# Patient Record
Sex: Female | Born: 1970
Health system: Southern US, Community
[De-identification: ages and names within clinical notes are randomized; demographics above are authoritative.]

## PROBLEM LIST (undated history)

## (undated) DIAGNOSIS — K219 Gastro-esophageal reflux disease without esophagitis: Secondary | ICD-10-CM

## (undated) DIAGNOSIS — F411 Generalized anxiety disorder: Secondary | ICD-10-CM

## (undated) DIAGNOSIS — I10 Essential (primary) hypertension: Secondary | ICD-10-CM

## (undated) DIAGNOSIS — R12 Heartburn: Secondary | ICD-10-CM

## (undated) DIAGNOSIS — D649 Anemia, unspecified: Secondary | ICD-10-CM

## (undated) HISTORY — DX: Heartburn: R12

## (undated) HISTORY — PX: SIGMOIDOSCOPY: SUR1295

## (undated) HISTORY — DX: Generalized anxiety disorder: F41.1

## (undated) HISTORY — PX: BREAST EXCISIONAL BIOPSY: SUR124

---
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
## 2002-11-12 ENCOUNTER — Other Ambulatory Visit: Admission: RE | Admit: 2002-11-12 | Discharge: 2002-11-12 | Payer: Self-pay | Admitting: Family Medicine

## 2004-01-26 ENCOUNTER — Other Ambulatory Visit: Admission: RE | Admit: 2004-01-26 | Discharge: 2004-01-26 | Payer: Self-pay | Admitting: Obstetrics and Gynecology

## 2004-05-27 ENCOUNTER — Inpatient Hospital Stay (HOSPITAL_COMMUNITY): Admission: AD | Admit: 2004-05-27 | Discharge: 2004-05-27 | Payer: Self-pay | Admitting: Obstetrics and Gynecology

## 2004-07-11 ENCOUNTER — Inpatient Hospital Stay (HOSPITAL_COMMUNITY): Admission: AD | Admit: 2004-07-11 | Discharge: 2004-07-11 | Payer: Self-pay | Admitting: Obstetrics & Gynecology

## 2004-07-18 ENCOUNTER — Inpatient Hospital Stay (HOSPITAL_COMMUNITY): Admission: AD | Admit: 2004-07-18 | Discharge: 2004-07-21 | Payer: Self-pay | Admitting: Obstetrics and Gynecology

## 2004-09-02 ENCOUNTER — Other Ambulatory Visit: Admission: RE | Admit: 2004-09-02 | Discharge: 2004-09-02 | Payer: Self-pay | Admitting: Obstetrics and Gynecology

## 2007-03-05 ENCOUNTER — Inpatient Hospital Stay (HOSPITAL_COMMUNITY): Admission: AD | Admit: 2007-03-05 | Discharge: 2007-03-07 | Payer: Self-pay | Admitting: Obstetrics & Gynecology

## 2007-05-16 LAB — CONVERTED CEMR LAB: Pap Smear: NORMAL

## 2009-03-02 ENCOUNTER — Ambulatory Visit: Payer: Self-pay | Admitting: Internal Medicine

## 2009-03-02 DIAGNOSIS — F419 Anxiety disorder, unspecified: Secondary | ICD-10-CM | POA: Insufficient documentation

## 2009-03-02 DIAGNOSIS — Z87448 Personal history of other diseases of urinary system: Secondary | ICD-10-CM | POA: Insufficient documentation

## 2009-03-02 LAB — CONVERTED CEMR LAB
ALT: 14 units/L (ref 0–35)
Basophils Relative: 2 % (ref 0.0–3.0)
Bilirubin, Direct: 0.2 mg/dL (ref 0.0–0.3)
Chloride: 103 meq/L (ref 96–112)
Cholesterol: 174 mg/dL (ref 0–200)
Eosinophils Relative: 2.8 % (ref 0.0–5.0)
HCT: 42.5 % (ref 36.0–46.0)
Hemoglobin: 14.2 g/dL (ref 12.0–15.0)
LDL Cholesterol: 93 mg/dL (ref 0–99)
Lymphs Abs: 1.7 10*3/uL (ref 0.7–4.0)
MCV: 89.5 fL (ref 78.0–100.0)
Monocytes Absolute: 0.5 10*3/uL (ref 0.1–1.0)
Neutro Abs: 4.2 10*3/uL (ref 1.4–7.7)
Potassium: 4.3 meq/L (ref 3.5–5.1)
RBC: 4.75 M/uL (ref 3.87–5.11)
Specific Gravity, Urine: <=1.005 (ref 1.000–1.030)
TSH: 2.49 microintl units/mL (ref 0.35–5.50)
Total Protein: 7.7 g/dL (ref 6.0–8.3)
Urine Glucose: NEGATIVE mg/dL
Urobilinogen, UA: 0.2 (ref 0.0–1.0)
WBC: 6.7 10*3/uL (ref 4.5–10.5)

## 2010-02-24 ENCOUNTER — Encounter: Payer: Self-pay | Admitting: Internal Medicine

## 2010-06-14 NOTE — Miscellaneous (Signed)
Summary: Flu Vaccination/[facility]  Flu Vaccination/[facility]   Imported By: Sherian Rein 02/28/2010 09:22:05  _____________________________________________________________________  External Attachment:    Type:   Image     Comment:   External Document

## 2010-08-01 ENCOUNTER — Telehealth: Payer: Self-pay | Admitting: Internal Medicine

## 2010-08-11 NOTE — Progress Notes (Signed)
Summary: anxiety symptoms - start paxil and sched cpx  Phone Note Call from Patient   Caller: Patient Call For: Marie Lukes MD Reason for Call: Talk to Doctor Summary of Call: pt with increase anxiety symptoms - still using occ clonazepam as needed but would like to try ssri to control chronic symptoms - unable to come in for apt immediately due to schedule issues (new emr and pt upcoming vacation) - will start low dose generic paxil (erx done) and f/u in 4-6 weeks to review meds and symptoms - pt agrees risk/benefit and aware of poss med SE - will call if probs prior to sched OV Initial call taken by: Marie Lukes MD,  August 01, 2010 12:59 PM  Follow-up for Phone Call        please sched this pt for cpx/labs in next 4-6 weeks when good for her sched - will review meds and symptoms at that time - thanks Follow-up by: Marie Lukes MD,  August 01, 2010 1:06 PM  Additional Follow-up for Phone Call Additional follow up Details #1::        left message to vm to cb to sched appt. Additional Follow-up by: Verdell Face,  August 01, 2010 3:10 PM    Additional Follow-up for Phone Call Additional follow up Details #2::    CPX sched for April 27th at 3p, pt wants to do labs same day. Follow-up by: Verdell Face,  August 01, 2010 4:29 PM  Additional Follow-up for Phone Call Additional follow up Details #3:: Details for Additional Follow-up Action Taken: Noted  Additional Follow-up by: Orlan Leavens RMA,  August 01, 2010 4:34 PM  New/Updated Medications: PAROXETINE HCL 10 MG TABS (PAROXETINE HCL) 1 by mouth once daily x 7 days, then increase to 2 by mouth once daily (or as directed) Prescriptions: PAROXETINE HCL 10 MG TABS (PAROXETINE HCL) 1 by mouth once daily x 7 days, then increase to 2 by mouth once daily (or as directed)  #60 x 1   Entered and Authorized by:   Marie Lukes MD   Signed by:   Marie Lukes MD on 08/01/2010   Method used:   Electronically to   [facility] # [id number]* (retail)       [address]       [place], [place]  [address]       Ph: [phone]       Fax: [phone]   RxID:   [id number]   Appended Document: anxiety symptoms - start paxil and sched cpx pt changed pharmacy to [facility] - new erx done Marie Lukes MD  August 01, 2010 5:40 PM    Clinical Lists Changes  Medications: Rx of PAROXETINE HCL 10 MG TABS (PAROXETINE HCL) 1 by mouth once daily x 7 days, then increase to 2 by mouth once daily (or as directed);  #60 x 1;  Signed;  Entered by: Marie Lukes MD;  Authorized by: Marie Lukes MD;  Method used: Electronically to [facility] # [id number], [place], [place]  [address], Ph: [phone], Fax: [phone]    Prescriptions: PAROXETINE HCL 10 MG TABS (PAROXETINE HCL) 1 by mouth once daily x 7 days, then increase to 2 by mouth once daily (or as directed)  #60 x 1   Entered and Authorized by:   Marie Lukes MD   Signed by:   Marie Lukes MD on 08/01/2010   Method used:  Electronically to        [facility] # [address] (retail)       [address]       [place], [place]  [address]       Ph: [phone]       Fax: [phone]   RxID:   [id number]

## 2010-09-05 ENCOUNTER — Encounter: Payer: Self-pay | Admitting: Internal Medicine

## 2010-09-08 ENCOUNTER — Other Ambulatory Visit (INDEPENDENT_AMBULATORY_CARE_PROVIDER_SITE_OTHER): Payer: BC Managed Care – PPO

## 2010-09-08 DIAGNOSIS — Z0000 Encounter for general adult medical examination without abnormal findings: Secondary | ICD-10-CM

## 2010-09-08 LAB — BASIC METABOLIC PANEL
BUN: 14 mg/dL (ref 6–23)
Calcium: 9.4 mg/dL (ref 8.4–10.5)
Creatinine, Ser: 0.8 mg/dL (ref 0.4–1.2)
GFR: 87.93 mL/min (ref >60.00)
Potassium: 4.7 mEq/L (ref 3.5–5.1)

## 2010-09-08 LAB — CBC WITH DIFFERENTIAL/PLATELET
Basophils Absolute: 0 10*3/uL (ref 0.0–0.1)
Eosinophils Relative: 2 % (ref 0.0–5.0)
HCT: 41.1 % (ref 36.0–46.0)
Hemoglobin: 14 g/dL (ref 12.0–15.0)
Lymphs Abs: 1.7 10*3/uL (ref 0.7–4.0)
MCV: 89.4 fl (ref 78.0–100.0)
Monocytes Absolute: 0.6 10*3/uL (ref 0.1–1.0)
Monocytes Relative: 7.5 % (ref 3.0–12.0)
Neutro Abs: 5.7 10*3/uL (ref 1.4–7.7)
Platelets: 272 10*3/uL (ref 150.0–400.0)
RDW: 13.8 % (ref 11.5–14.6)

## 2010-09-08 LAB — URINALYSIS
Nitrite: NEGATIVE
Specific Gravity, Urine: 1.02 (ref 1.000–1.030)
Total Protein, Urine: NEGATIVE
Urine Glucose: NEGATIVE
pH: 7 (ref 5.0–8.0)

## 2010-09-08 LAB — LIPID PANEL: Cholesterol: 183 mg/dL (ref 0–200)

## 2010-09-08 LAB — HEPATIC FUNCTION PANEL
ALT: 12 U/L (ref 0–35)
AST: 19 U/L (ref 0–37)
Bilirubin, Direct: 0.1 mg/dL (ref 0.0–0.3)
Total Bilirubin: 1.1 mg/dL (ref 0.3–1.2)

## 2010-09-09 ENCOUNTER — Ambulatory Visit (INDEPENDENT_AMBULATORY_CARE_PROVIDER_SITE_OTHER): Payer: BC Managed Care – PPO | Admitting: Internal Medicine

## 2010-09-09 ENCOUNTER — Other Ambulatory Visit: Payer: Self-pay | Admitting: Internal Medicine

## 2010-09-09 ENCOUNTER — Encounter: Payer: Self-pay | Admitting: Internal Medicine

## 2010-09-09 VITALS — BP 120/82 | HR 71 | Temp 98.4°F | Ht 65.0 in | Wt 131.0 lb

## 2010-09-09 DIAGNOSIS — Z0000 Encounter for general adult medical examination without abnormal findings: Secondary | ICD-10-CM

## 2010-09-09 MED ORDER — PAROXETINE HCL 10 MG PO TABS: 10.0000 mg | ORAL_TABLET | Freq: Two times a day (BID) | ORAL | Status: DC

## 2010-09-09 NOTE — Patient Instructions (Signed)
It was good to see you today. Labs and exams look good - keep up the good work. Try claritin or allegra for allergy and sinus pressure as needed Continue the Paxil as ongoing - Refill on medication(s) as discussed today. Please schedule followup in 6-12 months to review anxiety and medications, call sooner if problems.

## 2010-09-09 NOTE — Progress Notes (Signed)
  Subjective:    Patient ID: Marie Adams, female    DOB: 01-07-1971, 40 y.o.   MRN: [id number]  HPI patient is here today for annual physical. Patient feels well and has no complaints.  Past Medical History  Diagnosis Date  . ANXIETY    Family History  Problem Relation Age of Onset  . Arthritis Other   . Diabetes Other     Parent  . Hyperlipidemia Other     parent  . Hypertension Other     parent  . Transient ischemic attack Other     grandparent   History  Substance Use Topics  . Smoking status: Never Smoker   . Smokeless tobacco: Not on file   Comment: Lives with domestic partner 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  . Alcohol Use: No    Review of Systems  Constitutional: Negative for fever.  Respiratory: Negative for cough and shortness of breath.   Cardiovascular: Negative for chest pain.  Gastrointestinal: Negative for abdominal pain.  Musculoskeletal: Negative for gait problem.  Skin: Negative for rash.  Neurological: Negative for dizziness.  No other specific complaints in a complete review of systems (except as listed in HPI above).     Objective:   Physical Exam BP 120/82  Pulse 71  Temp(Src) 98.4 F (36.9 C) (Oral)  Ht 5\' 5"  (1.651 m)  Wt 131 lb (59.421 kg)  BMI 21.80 kg/m2  SpO2 97% Physical Exam  Constitutional: She is oriented to person, place, and time. She appears well-developed and well-nourished. No distress.  HENT: Head: Normocephalic and atraumatic. Ears: grossly normal hearing, TMs clear. Nose: Nose normal.  Mouth/Throat: Oropharynx is clear and moist. No oropharyngeal exudate.  Eyes: Conjunctivae and EOM are normal. Pupils are equal, round, and reactive to light. No scleral icterus.  Neck: Normal range of motion. Neck supple. No JVD present. No thyromegaly present.  Cardiovascular: Normal rate, regular rhythm and normal heart sounds.  No murmur heard. Pulmonary/Chest: Effort normal and breath sounds normal. No respiratory distress. She has no  wheezes.  Abdominal: Soft. Bowel sounds are normal. She exhibits no distension. There is no tenderness.  Musculoskeletal: Normal range of motion. She exhibits no edema.  GU: defer to gynecology Neurological: She is alert and oriented to person, place, and time. No cranial nerve deficit. Coordination normal.  Skin: Skin is warm and dry. No rash noted. No erythema.  Psychiatric: She has a normal mood and affect. Her behavior is normal. Judgment and thought content normal.   Lab Results  Component Value Date   WBC 8.3 09/08/2010   HGB 14.0 09/08/2010   HCT 41.1 09/08/2010   PLT 272.0 09/08/2010   CHOL 183 09/08/2010   TRIG 31.0 09/08/2010   HDL 77.70 09/08/2010   ALT 12 09/08/2010   AST 19 09/08/2010   NA 140 09/08/2010   K 4.7 09/08/2010   CL 100 09/08/2010   CREATININE 0.8 09/08/2010   BUN 14 09/08/2010   CO2 33* 09/08/2010   TSH 2.84 09/08/2010        Assessment & Plan:  CPX: Patient has been counseled on age-appropriate routine health concerns for screening and prevention. These are reviewed and up-to-date. Immunizations are up-to-date or declined. Labs and ECG reviewed (NSR, no ischemic change or arrythmia).

## 2011-02-22 LAB — CBC
Hemoglobin: 10.5 — ABNORMAL LOW
Platelets: 245
RBC: 3.37 — ABNORMAL LOW
RDW: 13.5
WBC: 17 — ABNORMAL HIGH
WBC: 17.2 — ABNORMAL HIGH

## 2011-02-22 LAB — RPR: RPR Ser Ql: NONREACTIVE

## 2011-03-02 ENCOUNTER — Encounter: Payer: Self-pay | Admitting: Internal Medicine

## 2011-03-09 ENCOUNTER — Encounter: Payer: Self-pay | Admitting: Internal Medicine

## 2011-04-13 ENCOUNTER — Other Ambulatory Visit: Payer: Self-pay | Admitting: Internal Medicine

## 2011-07-28 ENCOUNTER — Ambulatory Visit: Payer: BC Managed Care – PPO | Admitting: Internal Medicine

## 2011-08-01 ENCOUNTER — Telehealth: Payer: Self-pay | Admitting: *Deleted

## 2011-08-01 DIAGNOSIS — Z0000 Encounter for general adult medical examination without abnormal findings: Secondary | ICD-10-CM

## 2011-08-01 NOTE — Telephone Encounter (Signed)
Received staff msg pt made cpx for 09/20/11 need labs entered in epic... 08/01/11@9 :22am/LMB

## 2011-08-01 NOTE — Telephone Encounter (Signed)
Message copied by Deatra James on Tue Aug 01, 2011  8:55 AM ------      Message from: COUSIN, SHARON T      Created: Tue Aug 01, 2011  8:19 AM      Regarding: PHY DATE:   09/20/11       THANKS

## 2011-09-18 ENCOUNTER — Other Ambulatory Visit (INDEPENDENT_AMBULATORY_CARE_PROVIDER_SITE_OTHER): Payer: BC Managed Care – PPO

## 2011-09-18 DIAGNOSIS — Z0000 Encounter for general adult medical examination without abnormal findings: Secondary | ICD-10-CM

## 2011-09-18 LAB — BASIC METABOLIC PANEL
CO2: 28 mEq/L (ref 19–32)
Calcium: 9 mg/dL (ref 8.4–10.5)
GFR: 110.29 mL/min (ref >60.00)
Sodium: 141 mEq/L (ref 135–145)

## 2011-09-18 LAB — URINALYSIS, ROUTINE W REFLEX MICROSCOPIC
Hgb urine dipstick: NEGATIVE
Leukocytes, UA: NEGATIVE
Nitrite: NEGATIVE
Urobilinogen, UA: 0.2 (ref 0.0–1.0)

## 2011-09-18 LAB — CBC WITH DIFFERENTIAL/PLATELET
Basophils Absolute: 0 10*3/uL (ref 0.0–0.1)
HCT: 39 % (ref 36.0–46.0)
Lymphs Abs: 1.6 10*3/uL (ref 0.7–4.0)
Monocytes Relative: 9.8 % (ref 3.0–12.0)
Platelets: 230 10*3/uL (ref 150.0–400.0)
RDW: 14.6 % (ref 11.5–14.6)

## 2011-09-18 LAB — LIPID PANEL
HDL: 66.3 mg/dL (ref >39.00)
LDL Cholesterol: 69 mg/dL (ref 0–99)
Total CHOL/HDL Ratio: 2
Triglycerides: 74 mg/dL (ref 0.0–149.0)

## 2011-09-18 LAB — HEPATIC FUNCTION PANEL: Albumin: 3.9 g/dL (ref 3.5–5.2)

## 2011-09-20 ENCOUNTER — Encounter: Payer: Self-pay | Admitting: Internal Medicine

## 2011-09-20 ENCOUNTER — Ambulatory Visit (INDEPENDENT_AMBULATORY_CARE_PROVIDER_SITE_OTHER): Payer: BC Managed Care – PPO | Admitting: Internal Medicine

## 2011-09-20 VITALS — BP 118/76 | HR 81 | Temp 97.7°F | Ht 65.0 in | Wt 136.0 lb

## 2011-09-20 DIAGNOSIS — Z0000 Encounter for general adult medical examination without abnormal findings: Secondary | ICD-10-CM

## 2011-09-20 NOTE — Progress Notes (Signed)
  Subjective:    Patient ID: Marie Adams, female    DOB: [birth year], 41 y.o.   MRN: [id number]  HPI patient is here today for annual physical. Patient feels well and has no complaints.  Past Medical History  Diagnosis Date  . ANXIETY    Family History  Problem Relation Age of Onset  . Arthritis Maternal Grandmother   . Diabetes Father     borderline  . Hyperlipidemia Father   . Hypertension Father   . Transient ischemic attack Paternal Grandfather   . Hypertension Mother    History  Substance Use Topics  . Smoking status: Never Smoker   . Smokeless tobacco: Not on file   Comment: Lives with domestic partner 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  . Alcohol Use: No   Review of Systems Constitutional: Negative for fever or weight change.  Respiratory: Negative for cough and shortness of breath.   Cardiovascular: Negative for chest pain or palpitations.  Gastrointestinal: Negative for abdominal pain, no bowel changes.  Musculoskeletal: Negative for gait problem or joint swelling.  Skin: Negative for rash.  Neurological: Negative for dizziness or headache.  No other specific complaints in a complete review of systems (except as listed in HPI above).     Objective:   Physical Exam BP 118/76  Pulse 81  Temp(Src) 97.7 F (36.5 C) (Oral)  Ht 5\' 5"  (1.651 m)  Wt 136 lb (61.689 kg)  BMI 22.63 kg/m2  SpO2 99%  LMP 09/17/2011 Wt Readings from Last 3 Encounters:  09/20/11 136 lb (61.689 kg)  09/09/10 131 lb (59.421 kg)  03/02/09 131 lb 12.8 oz (59.784 kg)   Constitutional: She appears well-developed and well-nourished. No distress.  HENT: Head: Normocephalic and atraumatic. Ears: B TMs ok, no erythema or effusion; Nose: Nose normal. Mouth/Throat: Oropharynx is clear and moist. No oropharyngeal exudate.  Eyes: Conjunctivae and EOM are normal. Pupils are equal, round, and reactive to light. No scleral icterus.  Neck: Normal range of motion. Neck supple. No JVD present. No thyromegaly  present.  Cardiovascular: Normal rate, regular rhythm and normal heart sounds.  No murmur heard. No BLE edema. Pulmonary/Chest: Effort normal and breath sounds normal. No respiratory distress. She has no wheezes.  Abdominal: Soft. Bowel sounds are normal. She exhibits no distension. There is no tenderness. no masses Musculoskeletal: Normal range of motion, no joint effusions. No gross deformities Neurological: She is alert and oriented to person, place, and time. No cranial nerve deficit. Coordination normal.  Skin: Skin is warm and dry. No rash noted. No erythema.  Psychiatric: She has a normal mood and affect. Her behavior is normal. Judgment and thought content normal.   Lab Results  Component Value Date   WBC 9.0 09/18/2011   HGB 12.9 09/18/2011   HCT 39.0 09/18/2011   PLT 230.0 09/18/2011   GLUCOSE 92 09/18/2011   CHOL 150 09/18/2011   TRIG 74.0 09/18/2011   HDL 66.30 09/18/2011   LDLCALC 69 09/18/2011   ALT 13 09/18/2011   AST 19 09/18/2011   NA 141 09/18/2011   K 4.3 09/18/2011   CL 103 09/18/2011   CREATININE 0.6 09/18/2011   BUN 8 09/18/2011   CO2 28 09/18/2011   TSH 4.02 09/18/2011   ECG: sinus @ 71 bpm - no ischemic changes    Assessment & Plan:  CPX/v70.0 - Patient has been counseled on age-appropriate routine health concerns for screening and prevention. These are reviewed and up-to-date. Immunizations are up-to-date or declined. Labs and ECG reviewed.

## 2011-09-20 NOTE — Patient Instructions (Signed)
It was good to see you today. We have reviewed your interval history and records including labs and tests today - Everything looks great! Keep up the good work! Health Maintenance reviewed - all recommended immunizations and age-appropriate screenings are up-to-date. Medications reviewed, no changes at this time. Please schedule followup in 1 year for medical physical and labs, call sooner if problems.

## 2011-10-10 ENCOUNTER — Other Ambulatory Visit: Payer: Self-pay | Admitting: Internal Medicine

## 2012-07-27 ENCOUNTER — Telehealth: Payer: Self-pay | Admitting: Internal Medicine

## 2012-07-27 MED ORDER — PREDNISONE (PAK) 10 MG PO TABS: 10.0000 mg | ORAL_TABLET | ORAL | Status: DC

## 2012-07-27 NOTE — Telephone Encounter (Signed)
Pt reports itch and rash BUE and posterior neck, not responding to OTC meds Unclear trigger pred pak erx done and advised OTC hydrocortisone, to call if worse or unimproved  Pt understands and agrees to same

## 2012-10-07 ENCOUNTER — Other Ambulatory Visit: Payer: Self-pay | Admitting: Internal Medicine

## 2012-12-24 ENCOUNTER — Other Ambulatory Visit: Payer: Self-pay | Admitting: Internal Medicine

## 2013-01-24 ENCOUNTER — Other Ambulatory Visit: Payer: Self-pay | Admitting: Internal Medicine

## 2013-02-12 LAB — HM MAMMOGRAPHY

## 2013-02-24 ENCOUNTER — Other Ambulatory Visit: Payer: Self-pay | Admitting: Internal Medicine

## 2013-03-11 ENCOUNTER — Other Ambulatory Visit: Payer: Self-pay | Admitting: Internal Medicine

## 2013-04-17 ENCOUNTER — Ambulatory Visit (INDEPENDENT_AMBULATORY_CARE_PROVIDER_SITE_OTHER): Payer: BC Managed Care – PPO | Admitting: Internal Medicine

## 2013-04-17 ENCOUNTER — Other Ambulatory Visit (INDEPENDENT_AMBULATORY_CARE_PROVIDER_SITE_OTHER): Payer: BC Managed Care – PPO

## 2013-04-17 ENCOUNTER — Encounter: Payer: Self-pay | Admitting: Internal Medicine

## 2013-04-17 VITALS — BP 102/78 | HR 99 | Temp 97.8°F | Ht 65.0 in | Wt 142.4 lb

## 2013-04-17 DIAGNOSIS — Z0000 Encounter for general adult medical examination without abnormal findings: Secondary | ICD-10-CM

## 2013-04-17 LAB — LIPID PANEL
Cholesterol: 221 mg/dL — ABNORMAL HIGH (ref 0–200)
Total CHOL/HDL Ratio: 3
Triglycerides: 56 mg/dL (ref 0.0–149.0)
VLDL: 11.2 mg/dL (ref 0.0–40.0)

## 2013-04-17 LAB — CBC WITH DIFFERENTIAL/PLATELET
Basophils Relative: 2.3 % (ref 0.0–3.0)
Eosinophils Absolute: 0.2 10*3/uL (ref 0.0–0.7)
Lymphocytes Relative: 16.3 % (ref 12.0–46.0)
MCHC: 32.1 g/dL (ref 30.0–36.0)
Neutrophils Relative %: 69 % (ref 43.0–77.0)
Platelets: 277 10*3/uL (ref 150.0–400.0)
RBC: 4.59 Mil/uL (ref 3.87–5.11)
WBC: 8.9 10*3/uL (ref 4.5–10.5)

## 2013-04-17 LAB — HEPATIC FUNCTION PANEL: Total Bilirubin: 0.6 mg/dL (ref 0.3–1.2)

## 2013-04-17 LAB — BASIC METABOLIC PANEL
BUN: 17 mg/dL (ref 6–23)
CO2: 29 mEq/L (ref 19–32)
Calcium: 9.3 mg/dL (ref 8.4–10.5)
Creatinine, Ser: 0.8 mg/dL (ref 0.4–1.2)

## 2013-04-17 LAB — TSH: TSH: 2.24 u[IU]/mL (ref 0.35–5.50)

## 2013-04-17 LAB — LDL CHOLESTEROL, DIRECT: Direct LDL: 129 mg/dL

## 2013-04-17 MED ORDER — PAROXETINE HCL 10 MG PO TABS: ORAL_TABLET | ORAL | Status: DC

## 2013-04-17 NOTE — Progress Notes (Signed)
   Subjective:    Patient ID: Marie Adams, female    DOB: Oct 01, 1970, 42 y.o.   MRN: [id number]  HPI patient is here today for annual physical. Patient feels well and has no complaints.  Past Medical History  Diagnosis Date  . ANXIETY    Family History  Problem Relation Age of Onset  . Arthritis Maternal Grandmother   . Diabetes Father     borderline  . Hyperlipidemia Father   . Hypertension Father   . Transient ischemic attack Paternal Grandfather   . Hypertension Mother    History  Substance Use Topics  . Smoking status: Never Smoker   . Smokeless tobacco: Not on file     Comment: Lives with domestic partner 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  . Alcohol Use: No    Review of Systems  Constitutional: Negative for fatigue and unexpected weight change.  Respiratory: Negative for cough, shortness of breath and wheezing.   Cardiovascular: Negative for chest pain, palpitations and leg swelling.  Gastrointestinal: Negative for nausea, abdominal pain and diarrhea.  Neurological: Negative for dizziness, weakness, light-headedness and headaches.  Psychiatric/Behavioral: Negative for dysphoric mood. The patient is not nervous/anxious.   All other systems reviewed and are negative.       Objective:   Physical Exam BP 102/78  Pulse 99  Temp(Src) 97.8 F (36.6 C) (Oral)  Ht 5\' 5"  (1.651 m)  Wt 142 lb 6.4 oz (64.592 kg)  BMI 23.70 kg/m2  SpO2 98% Wt Readings from Last 3 Encounters:  04/17/13 142 lb 6.4 oz (64.592 kg)  09/20/11 136 lb (61.689 kg)  09/09/10 131 lb (59.421 kg)   Constitutional: She appears well-developed and well-nourished. No distress.  HENT: Head: Normocephalic and atraumatic. Ears: B TMs ok, no erythema or effusion; Nose: Nose normal. Mouth/Throat: Oropharynx is clear and moist. No oropharyngeal exudate.  Eyes: Conjunctivae and EOM are normal. Pupils are equal, round, and reactive to light. No scleral icterus.  Neck: Normal range of motion. Neck supple. No JVD  present. No thyromegaly present.  Cardiovascular: Normal rate, regular rhythm and normal heart sounds.  No murmur heard. No BLE edema. Pulmonary/Chest: Effort normal and breath sounds normal. No respiratory distress. She has no wheezes.  Abdominal: Soft. Bowel sounds are normal. She exhibits no distension. There is no tenderness. no masses Musculoskeletal: Normal range of motion, no joint effusions. No gross deformities Neurological: She is alert and oriented to person, place, and time. No cranial nerve deficit. Coordination, balance, strength, speech and gait are normal.  Skin: Skin is warm and dry. No rash noted. No erythema.  Psychiatric: She has a normal mood and affect. Her behavior is normal. Judgment and thought content normal.   Lab Results  Component Value Date   WBC 9.0 09/18/2011   HGB 12.9 09/18/2011   HCT 39.0 09/18/2011   PLT 230.0 09/18/2011   GLUCOSE 92 09/18/2011   CHOL 150 09/18/2011   TRIG 74.0 09/18/2011   HDL 66.30 09/18/2011   LDLCALC 69 09/18/2011   ALT 13 09/18/2011   AST 19 09/18/2011   NA 141 09/18/2011   K 4.3 09/18/2011   CL 103 09/18/2011   CREATININE 0.6 09/18/2011   BUN 8 09/18/2011   CO2 28 09/18/2011   TSH 4.02 09/18/2011       Assessment & Plan:   CPX/v70.0 - Patient has been counseled on age-appropriate routine health concerns for screening and prevention. These are reviewed and up-to-date. Immunizations are up-to-date or declined. Labs ordered and reviewed.

## 2013-04-17 NOTE — Progress Notes (Signed)
Pre-visit discussion using our clinic review tool. No additional management support is needed unless otherwise documented below in the visit note.  

## 2013-04-17 NOTE — Patient Instructions (Addendum)
It was good to see you today.  Health Maintenance reviewed - all recommended immunizations and age-appropriate screenings are up-to-date.  Test(s) ordered today. Your results will be released to MyChart (or called to you) after review, usually within 72hours after test completion. If any changes need to be made, you will be notified at that same time.  Medications reviewed and updated, no changes recommended at this time. Refill on medication(s) as discussed today.  Please schedule followup in 12 months for annual exam/labs, call sooner if problems.   Health Maintenance, Female A healthy lifestyle and preventative care can promote health and wellness.  Maintain regular health, dental, and eye exams.  Eat a healthy diet. Foods like vegetables, fruits, whole grains, low-fat dairy products, and lean protein foods contain the nutrients you need without too many calories. Decrease your intake of foods high in solid fats, added sugars, and salt. Get information about a proper diet from your caregiver, if necessary.  Regular physical exercise is one of the most important things you can do for your health. Most adults should get at least 150 minutes of moderate-intensity exercise (any activity that increases your heart rate and causes you to sweat) each week. In addition, most adults need muscle-strengthening exercises on 2 or more days a week.   Maintain a healthy weight. The body mass index (BMI) is a screening tool to identify possible weight problems. It provides an estimate of body fat based on height and weight. Your caregiver can help determine your BMI, and can help you achieve or maintain a healthy weight. For adults 20 years and older:  A BMI below 18.5 is considered underweight.  A BMI of 18.5 to 24.9 is normal.  A BMI of 25 to 29.9 is considered overweight.  A BMI of 30 and above is considered obese.  Maintain normal blood lipids and cholesterol by exercising and minimizing your  intake of saturated fat. Eat a balanced diet with plenty of fruits and vegetables. Blood tests for lipids and cholesterol should begin at age 41 and be repeated every 5 years. If your lipid or cholesterol levels are high, you are over 50, or you are a high risk for heart disease, you may need your cholesterol levels checked more frequently.Ongoing high lipid and cholesterol levels should be treated with medicines if diet and exercise are not effective.  If you smoke, find out from your caregiver how to quit. If you do not use tobacco, do not start.  Lung cancer screening is recommended for adults aged 46 80 years who are at high risk for developing lung cancer because of a history of smoking. Yearly low-dose computed tomography (CT) is recommended for people who have at least a 30-pack-year history of smoking and are a current smoker or have quit within the past 15 years. A pack year of smoking is smoking an average of 1 pack of cigarettes a day for 1 year (for example: 1 pack a day for 30 years or 2 packs a day for 15 years). Yearly screening should continue until the smoker has stopped smoking for at least 15 years. Yearly screening should also be stopped for people who develop a health problem that would prevent them from having lung cancer treatment.  If you are pregnant, do not drink alcohol. If you are breastfeeding, be very cautious about drinking alcohol. If you are not pregnant and choose to drink alcohol, do not exceed 1 drink per day. One drink is considered to be 12 ounces (355  mL) of beer, 5 ounces (148 mL) of wine, or 1.5 ounces (44 mL) of liquor.  Avoid use of street drugs. Do not share needles with anyone. Ask for help if you need support or instructions about stopping the use of drugs.  High blood pressure causes heart disease and increases the risk of stroke. Blood pressure should be checked at least every 1 to 2 years. Ongoing high blood pressure should be treated with medicines, if  weight loss and exercise are not effective.  If you are 39 to 43 years old, ask your caregiver if you should take aspirin to prevent strokes.  Diabetes screening involves taking a blood sample to check your fasting blood sugar level. This should be done once every 3 years, after age 54, if you are within normal weight and without risk factors for diabetes. Testing should be considered at a younger age or be carried out more frequently if you are overweight and have at least 1 risk factor for diabetes.  Breast cancer screening is essential preventative care for women. You should practice "breast self-awareness." This means understanding the normal appearance and feel of your breasts and may include breast self-examination. Any changes detected, no matter how small, should be reported to a caregiver. Women in their 27s and 30s should have a clinical breast exam (CBE) by a caregiver as part of a regular health exam every 1 to 3 years. After age 81, women should have a CBE every year. Starting at age 33, women should consider having a mammogram (breast X-ray) every year. Women who have a family history of breast cancer should talk to their caregiver about genetic screening. Women at a high risk of breast cancer should talk to their caregiver about having an MRI and a mammogram every year.  Breast cancer gene (BRCA)-related cancer risk assessment is recommended for women who have family members with BRCA-related cancers. BRCA-related cancers include breast, ovarian, tubal, and peritoneal cancers. Having family members with these cancers may be associated with an increased risk for harmful changes (mutations) in the breast cancer genes BRCA1 and BRCA2. Results of the assessment will determine the need for genetic counseling and BRCA1 and BRCA2 testing.  The Pap test is a screening test for cervical cancer. Women should have a Pap test starting at age 67. Between ages 55 and 44, Pap tests should be repeated every  2 years. Beginning at age 43, you should have a Pap test every 3 years as long as the past 3 Pap tests have been normal. If you had a hysterectomy for a problem that was not cancer or a condition that could lead to cancer, then you no longer need Pap tests. If you are between ages 57 and 49, and you have had normal Pap tests going back 10 years, you no longer need Pap tests. If you have had past treatment for cervical cancer or a condition that could lead to cancer, you need Pap tests and screening for cancer for at least 20 years after your treatment. If Pap tests have been discontinued, risk factors (such as a new sexual partner) need to be reassessed to determine if screening should be resumed. Some women have medical problems that increase the chance of getting cervical cancer. In these cases, your caregiver may recommend more frequent screening and Pap tests.  The human papillomavirus (HPV) test is an additional test that may be used for cervical cancer screening. The HPV test looks for the virus that can cause the cell  changes on the cervix. The cells collected during the Pap test can be tested for HPV. The HPV test could be used to screen women aged 36 years and older, and should be used in women of any age who have unclear Pap test results. After the age of 18, women should have HPV testing at the same frequency as a Pap test.  Colorectal cancer can be detected and often prevented. Most routine colorectal cancer screening begins at the age of 70 and continues through age 65. However, your caregiver may recommend screening at an earlier age if you have risk factors for colon cancer. On a yearly basis, your caregiver may provide home test kits to check for hidden blood in the stool. Use of a small camera at the end of a tube, to directly examine the colon (sigmoidoscopy or colonoscopy), can detect the earliest forms of colorectal cancer. Talk to your caregiver about this at age 1, when routine screening  begins. Direct examination of the colon should be repeated every 5 to 10 years through age 81, unless early forms of pre-cancerous polyps or small growths are found.  Hepatitis C blood testing is recommended for all people born from [birth year] through 1966 and any individual with known risks for hepatitis C.  Practice safe sex. Use condoms and avoid high-risk sexual practices to reduce the spread of sexually transmitted infections (STIs). Sexually active women aged 96 and younger should be checked for Chlamydia, which is a common sexually transmitted infection. Older women with new or multiple partners should also be tested for Chlamydia. Testing for other STIs is recommended if you are sexually active and at increased risk.  Osteoporosis is a disease in which the bones lose minerals and strength with aging. This can result in serious bone fractures. The risk of osteoporosis can be identified using a bone density scan. Women ages 54 and over and women at risk for fractures or osteoporosis should discuss screening with their caregivers. Ask your caregiver whether you should be taking a calcium supplement or vitamin D to reduce the rate of osteoporosis.  Menopause can be associated with physical symptoms and risks. Hormone replacement therapy is available to decrease symptoms and risks. You should talk to your caregiver about whether hormone replacement therapy is right for you.  Use sunscreen. Apply sunscreen liberally and repeatedly throughout the day. You should seek shade when your shadow is shorter than you. Protect yourself by wearing long sleeves, pants, a wide-brimmed hat, and sunglasses year round, whenever you are outdoors.  Notify your caregiver of new moles or changes in moles, especially if there is a change in shape or color. Also notify your caregiver if a mole is larger than the size of a pencil eraser.  Stay current with your immunizations. Document Released: 11/14/2010 Document Revised:  08/26/2012 Document Reviewed: 11/14/2010 [facility] Patient Information 2014 [place], [place].

## 2013-10-13 ENCOUNTER — Telehealth: Payer: Self-pay | Admitting: *Deleted

## 2013-10-13 MED ORDER — PAROXETINE HCL 10 MG PO TABS: 20.0000 mg | ORAL_TABLET | Freq: Every day | ORAL | Status: DC

## 2013-10-13 NOTE — Telephone Encounter (Signed)
Done as requested.

## 2013-10-13 NOTE — Telephone Encounter (Signed)
Received fax stating pt states she is taking 2 paroxetine every morning. Requesting updated script to be sent to pharmacy...Johny Chess

## 2014-01-05 ENCOUNTER — Other Ambulatory Visit: Payer: Self-pay

## 2014-01-05 NOTE — Telephone Encounter (Signed)
After speaking to the pharmacy, they do have the correct rx sig for Paroxetine.

## 2014-07-14 LAB — HM MAMMOGRAPHY

## 2014-07-14 LAB — HM PAP SMEAR

## 2014-09-02 ENCOUNTER — Ambulatory Visit (INDEPENDENT_AMBULATORY_CARE_PROVIDER_SITE_OTHER): Payer: 59 | Admitting: Internal Medicine

## 2014-09-02 ENCOUNTER — Other Ambulatory Visit (INDEPENDENT_AMBULATORY_CARE_PROVIDER_SITE_OTHER): Payer: 59

## 2014-09-02 ENCOUNTER — Encounter: Payer: Self-pay | Admitting: Internal Medicine

## 2014-09-02 VITALS — BP 122/82 | HR 91 | Temp 98.1°F | Ht 65.0 in | Wt 145.1 lb

## 2014-09-02 DIAGNOSIS — Z0000 Encounter for general adult medical examination without abnormal findings: Secondary | ICD-10-CM

## 2014-09-02 LAB — BASIC METABOLIC PANEL
BUN: 11 mg/dL (ref 6–23)
CO2: 28 meq/L (ref 19–32)
Calcium: 9.8 mg/dL (ref 8.4–10.5)
Chloride: 101 mEq/L (ref 96–112)
Creatinine, Ser: 0.76 mg/dL (ref 0.40–1.20)
GFR: 87.6 mL/min (ref >60.00)
GLUCOSE: 81 mg/dL (ref 70–99)
POTASSIUM: 4.1 meq/L (ref 3.5–5.1)
SODIUM: 137 meq/L (ref 135–145)

## 2014-09-02 LAB — LIPID PANEL
CHOL/HDL RATIO: 2
Cholesterol: 203 mg/dL — ABNORMAL HIGH (ref 0–200)
HDL: 85 mg/dL (ref >39.00)
LDL Cholesterol: 103 mg/dL — ABNORMAL HIGH (ref 0–99)
NonHDL: 118
Triglycerides: 77 mg/dL (ref 0.0–149.0)
VLDL: 15.4 mg/dL (ref 0.0–40.0)

## 2014-09-02 LAB — HEPATIC FUNCTION PANEL
ALK PHOS: 48 U/L (ref 39–117)
ALT: 11 U/L (ref 0–35)
AST: 20 U/L (ref 0–37)
Albumin: 4.6 g/dL (ref 3.5–5.2)
BILIRUBIN DIRECT: 0.1 mg/dL (ref 0.0–0.3)
TOTAL PROTEIN: 7.6 g/dL (ref 6.0–8.3)
Total Bilirubin: 0.6 mg/dL (ref 0.2–1.2)

## 2014-09-02 LAB — CBC WITH DIFFERENTIAL/PLATELET
BASOS PCT: 0.6 % (ref 0.0–3.0)
Basophils Absolute: 0 10*3/uL (ref 0.0–0.1)
Eosinophils Absolute: 0.2 10*3/uL (ref 0.0–0.7)
Eosinophils Relative: 2.3 % (ref 0.0–5.0)
HEMATOCRIT: 40.2 % (ref 36.0–46.0)
Hemoglobin: 13.4 g/dL (ref 12.0–15.0)
LYMPHS ABS: 1.7 10*3/uL (ref 0.7–4.0)
Lymphocytes Relative: 22.5 % (ref 12.0–46.0)
MCHC: 33.3 g/dL (ref 30.0–36.0)
MCV: 81.2 fl (ref 78.0–100.0)
MONOS PCT: 9.3 % (ref 3.0–12.0)
Monocytes Absolute: 0.7 10*3/uL (ref 0.1–1.0)
NEUTROS ABS: 4.9 10*3/uL (ref 1.4–7.7)
NEUTROS PCT: 65.3 % (ref 43.0–77.0)
Platelets: 313 10*3/uL (ref 150.0–400.0)
RBC: 4.95 Mil/uL (ref 3.87–5.11)
RDW: 14.8 % (ref 11.5–15.5)
WBC: 7.5 10*3/uL (ref 4.0–10.5)

## 2014-09-02 LAB — URINALYSIS, ROUTINE W REFLEX MICROSCOPIC
Bilirubin Urine: NEGATIVE
Hgb urine dipstick: NEGATIVE
Ketones, ur: NEGATIVE
Leukocytes, UA: NEGATIVE
NITRITE: NEGATIVE
PH: 7 (ref 5.0–8.0)
RBC / HPF: NONE SEEN (ref 0–?)
SPECIFIC GRAVITY, URINE: 1.01 (ref 1.000–1.030)
TOTAL PROTEIN, URINE-UPE24: NEGATIVE
Urine Glucose: NEGATIVE
Urobilinogen, UA: 0.2 (ref 0.0–1.0)
WBC UA: NONE SEEN (ref 0–?)

## 2014-09-02 LAB — TSH: TSH: 2.33 u[IU]/mL (ref 0.35–4.50)

## 2014-09-02 MED ORDER — PAROXETINE HCL 10 MG PO TABS: 20.0000 mg | ORAL_TABLET | Freq: Every day | ORAL | Status: DC

## 2014-09-02 NOTE — Patient Instructions (Addendum)
It was good to see you today.  We have reviewed your prior records including labs and tests today  Health Maintenance reviewed - all recommended immunizations and age-appropriate screenings are up-to-date.  Test(s) ordered today. Your results will be released to MyChart (or called to you) after review, usually within 72hours after test completion. If any changes need to be made, you will be notified at that same time.  Medications reviewed and updated, no changes recommended at this time.  Please schedule followup in 12 months for annual exam and labs, call sooner if problems.  Health Maintenance Adopting a healthy lifestyle and getting preventive care can go a long way to promote health and wellness. Talk with your health care provider about what schedule of regular examinations is right for you. This is a good chance for you to check in with your provider about disease prevention and staying healthy. In between checkups, there are plenty of things you can do on your own. Experts have done a lot of research about which lifestyle changes and preventive measures are most likely to keep you healthy. Ask your health care provider for more information. WEIGHT AND DIET  Eat a healthy diet  Be sure to include plenty of vegetables, fruits, low-fat dairy products, and lean protein.  Do not eat a lot of foods high in solid fats, added sugars, or salt.  Get regular exercise. This is one of the most important things you can do for your health.  Most adults should exercise for at least 150 minutes each week. The exercise should increase your heart rate and make you sweat (moderate-intensity exercise).  Most adults should also do strengthening exercises at least twice a week. This is in addition to the moderate-intensity exercise.  Maintain a healthy weight  Body mass index (BMI) is a measurement that can be used to identify possible weight problems. It estimates body fat based on height and weight.  Your health care provider can help determine your BMI and help you achieve or maintain a healthy weight.  For females 20 years of age and older:   A BMI below 18.5 is considered underweight.  A BMI of 18.5 to 24.9 is normal.  A BMI of 25 to 29.9 is considered overweight.  A BMI of 30 and above is considered obese.  Watch levels of cholesterol and blood lipids  You should start having your blood tested for lipids and cholesterol at 45 years of age, then have this test every 5 years.  You may need to have your cholesterol levels checked more often if:  Your lipid or cholesterol levels are high.  You are older than 45 years of age.  You are at high risk for heart disease.  CANCER SCREENING   Lung Cancer  Lung cancer screening is recommended for adults 55-80 years old who are at high risk for lung cancer because of a history of smoking.  A yearly low-dose CT scan of the lungs is recommended for people who:  Currently smoke.  Have quit within the past 15 years.  Have at least a 30-pack-year history of smoking. A pack year is smoking an average of one pack of cigarettes a day for 1 year.  Yearly screening should continue until it has been 15 years since you quit.  Yearly screening should stop if you develop a health problem that would prevent you from having lung cancer treatment.  Breast Cancer  Practice breast self-awareness. This means understanding how your breasts normally appear and   feel.  It also means doing regular breast self-exams. Let your health care provider know about any changes, no matter how small.  If you are in your 20s or 30s, you should have a clinical breast exam (CBE) by a health care provider every 1-3 years as part of a regular health exam.  If you are 40 or older, have a CBE every year. Also consider having a breast X-ray (mammogram) every year.  If you have a family history of breast cancer, talk to your health care provider about genetic  screening.  If you are at high risk for breast cancer, talk to your health care provider about having an MRI and a mammogram every year.  Breast cancer gene (BRCA) assessment is recommended for women who have family members with BRCA-related cancers. BRCA-related cancers include:  Breast.  Ovarian.  Tubal.  Peritoneal cancers.  Results of the assessment will determine the need for genetic counseling and BRCA1 and BRCA2 testing. Cervical Cancer Routine pelvic examinations to screen for cervical cancer are no longer recommended for nonpregnant women who are considered low risk for cancer of the pelvic organs (ovaries, uterus, and vagina) and who do not have symptoms. A pelvic examination may be necessary if you have symptoms including those associated with pelvic infections. Ask your health care provider if a screening pelvic exam is right for you.   The Pap test is the screening test for cervical cancer for women who are considered at risk.  If you had a hysterectomy for a problem that was not cancer or a condition that could lead to cancer, then you no longer need Pap tests.  If you are older than 65 years, and you have had normal Pap tests for the past 10 years, you no longer need to have Pap tests.  If you have had past treatment for cervical cancer or a condition that could lead to cancer, you need Pap tests and screening for cancer for at least 20 years after your treatment.  If you no longer get a Pap test, assess your risk factors if they change (such as having a new sexual partner). This can affect whether you should start being screened again.  Some women have medical problems that increase their chance of getting cervical cancer. If this is the case for you, your health care provider may recommend more frequent screening and Pap tests.  The human papillomavirus (HPV) test is another test that may be used for cervical cancer screening. The HPV test looks for the virus that can  cause cell changes in the cervix. The cells collected during the Pap test can be tested for HPV.  The HPV test can be used to screen women 30 years of age and older. Getting tested for HPV can extend the interval between normal Pap tests from three to five years.  An HPV test also should be used to screen women of any age who have unclear Pap test results.  After 45 years of age, women should have HPV testing as often as Pap tests.  Colorectal Cancer  This type of cancer can be detected and often prevented.  Routine colorectal cancer screening usually begins at 45 years of age and continues through 45 years of age.  Your health care provider may recommend screening at an earlier age if you have risk factors for colon cancer.  Your health care provider may also recommend using home test kits to check for hidden blood in the stool.  A small camera   at the end of a tube can be used to examine your colon directly (sigmoidoscopy or colonoscopy). This is done to check for the earliest forms of colorectal cancer.  Routine screening usually begins at age 50.  Direct examination of the colon should be repeated every 5-10 years through 45 years of age. However, you may need to be screened more often if early forms of precancerous polyps or small growths are found. Skin Cancer  Check your skin from head to toe regularly.  Tell your health care provider about any new moles or changes in moles, especially if there is a change in a mole's shape or color.  Also tell your health care provider if you have a mole that is larger than the size of a pencil eraser.  Always use sunscreen. Apply sunscreen liberally and repeatedly throughout the day.  Protect yourself by wearing long sleeves, pants, a wide-brimmed hat, and sunglasses whenever you are outside. HEART DISEASE, DIABETES, AND HIGH BLOOD PRESSURE   Have your blood pressure checked at least every 1-2 years. High blood pressure causes heart  disease and increases the risk of stroke.  If you are between 55 years and 79 years old, ask your health care provider if you should take aspirin to prevent strokes.  Have regular diabetes screenings. This involves taking a blood sample to check your fasting blood sugar level.  If you are at a normal weight and have a low risk for diabetes, have this test once every three years after 45 years of age.  If you are overweight and have a high risk for diabetes, consider being tested at a younger age or more often. PREVENTING INFECTION  Hepatitis B  If you have a higher risk for hepatitis B, you should be screened for this virus. You are considered at high risk for hepatitis B if:  You were born in a country where hepatitis B is common. Ask your health care provider which countries are considered high risk.  Your parents were born in a high-risk country, and you have not been immunized against hepatitis B (hepatitis B vaccine).  You have HIV or AIDS.  You use needles to inject street drugs.  You live with someone who has hepatitis B.  You have had sex with someone who has hepatitis B.  You get hemodialysis treatment.  You take certain medicines for conditions, including cancer, organ transplantation, and autoimmune conditions. Hepatitis C  Blood testing is recommended for:  Everyone born from 1946 through 1965.  Anyone with known risk factors for hepatitis C. Sexually transmitted infections (STIs)  You should be screened for sexually transmitted infections (STIs) including gonorrhea and chlamydia if:  You are sexually active and are younger than 45 years of age.  You are older than 45 years of age and your health care provider tells you that you are at risk for this type of infection.  Your sexual activity has changed since you were last screened and you are at an increased risk for chlamydia or gonorrhea. Ask your health care provider if you are at risk.  If you do not have  HIV, but are at risk, it may be recommended that you take a prescription medicine daily to prevent HIV infection. This is called pre-exposure prophylaxis (PrEP). You are considered at risk if:  You are sexually active and do not regularly use condoms or know the HIV status of your partner(s).  You take drugs by injection.  You are sexually active with a partner   who has HIV. Talk with your health care provider about whether you are at high risk of being infected with HIV. If you choose to begin PrEP, you should first be tested for HIV. You should then be tested every 3 months for as long as you are taking PrEP.  PREGNANCY   If you are premenopausal and you may become pregnant, ask your health care provider about preconception counseling.  If you may become pregnant, take 400 to 800 micrograms (mcg) of folic acid every day.  If you want to prevent pregnancy, talk to your health care provider about birth control (contraception). OSTEOPOROSIS AND MENOPAUSE   Osteoporosis is a disease in which the bones lose minerals and strength with aging. This can result in serious bone fractures. Your risk for osteoporosis can be identified using a bone density scan.  If you are 65 years of age or older, or if you are at risk for osteoporosis and fractures, ask your health care provider if you should be screened.  Ask your health care provider whether you should take a calcium or vitamin D supplement to lower your risk for osteoporosis.  Menopause may have certain physical symptoms and risks.  Hormone replacement therapy may reduce some of these symptoms and risks. Talk to your health care provider about whether hormone replacement therapy is right for you.  HOME CARE INSTRUCTIONS   Schedule regular health, dental, and eye exams.  Stay current with your immunizations.   Do not use any tobacco products including cigarettes, chewing tobacco, or electronic cigarettes.  If you are pregnant, do not  drink alcohol.  If you are breastfeeding, limit how much and how often you drink alcohol.  Limit alcohol intake to no more than 1 drink per day for nonpregnant women. One drink equals 12 ounces of beer, 5 ounces of wine, or 1 ounces of hard liquor.  Do not use street drugs.  Do not share needles.  Ask your health care provider for help if you need support or information about quitting drugs.  Tell your health care provider if you often feel depressed.  Tell your health care provider if you have ever been abused or do not feel safe at home. Document Released: 11/14/2010 Document Revised: 09/15/2013 Document Reviewed: 04/02/2013 [employer] Patient Information 2015 [employer]. This information is not intended to replace advice given to you by your health care provider. Make sure you discuss any questions you have with your health care provider.  

## 2014-09-02 NOTE — Progress Notes (Signed)
Subjective:    Patient ID: Marie Adams, female    DOB: [birth year], 44 y.o.   MRN: [id number]  HPI  patient is here today for annual physical. Patient feels well and has no complaints.   Past Medical History  Diagnosis Date  . ANXIETY    Family History  Problem Relation Age of Onset  . Arthritis Maternal Grandmother   . Diabetes Father     borderline  . Hyperlipidemia Father   . Hypertension Father   . Transient ischemic attack Paternal Grandfather   . Hypertension Mother    History  Substance Use Topics  . Smoking status: Never Smoker   . Smokeless tobacco: Not on file  . Alcohol Use: No    Review of Systems  Constitutional: Negative for fatigue and unexpected weight change.  Respiratory: Negative for cough, shortness of breath and wheezing.   Cardiovascular: Negative for chest pain, palpitations and leg swelling.  Gastrointestinal: Negative for nausea, abdominal pain and diarrhea.  Genitourinary: Positive for urgency. Negative for dysuria and difficulty urinating.  Skin:       Dry lips/mouth x 1 mo  Neurological: Negative for dizziness, weakness, light-headedness and headaches.  Psychiatric/Behavioral: Negative for dysphoric mood. The patient is not nervous/anxious.   All other systems reviewed and are negative.      Objective:    Physical Exam  Constitutional: She is oriented to person, place, and time. She appears well-developed and well-nourished. No distress.  HENT:  Head: Normocephalic and atraumatic.  Right Ear: External ear normal.  Left Ear: External ear normal.  Nose: Nose normal.  Mouth/Throat: Oropharynx is clear and moist. No oropharyngeal exudate.  Eyes: EOM are normal. Pupils are equal, round, and reactive to light. Right eye exhibits no discharge. Left eye exhibits no discharge. No scleral icterus.  Neck: Normal range of motion. Neck supple. No JVD present. No tracheal deviation present. No thyromegaly present.  Cardiovascular: Normal rate,  regular rhythm, normal heart sounds and intact distal pulses.  Exam reveals no friction rub.   No murmur heard. Pulmonary/Chest: Effort normal and breath sounds normal. No respiratory distress. She has no wheezes. She has no rales. She exhibits no tenderness.  Abdominal: Soft. Bowel sounds are normal. She exhibits no distension and no mass. There is no tenderness. There is no rebound and no guarding.  Genitourinary:  Defer to gyn  Musculoskeletal: Normal range of motion.  No gross deformities  Lymphadenopathy:    She has no cervical adenopathy.  Neurological: She is alert and oriented to person, place, and time. She has normal reflexes. No cranial nerve deficit.  Skin: Skin is warm and dry. No rash noted. She is not diaphoretic. No erythema.  Psychiatric: She has a normal mood and affect. Her behavior is normal. Judgment and thought content normal.  Nursing note and vitals reviewed.   BP 122/82 mmHg  Pulse 91  Temp(Src) 98.1 F (36.7 C) (Oral)  Ht 5\' 5"  (1.651 m)  Wt 145 lb 2 oz (65.828 kg)  BMI 24.15 kg/m2  SpO2 98%  LMP 08/14/2014 Wt Readings from Last 3 Encounters:  09/02/14 145 lb 2 oz (65.828 kg)  04/17/13 142 lb 6.4 oz (64.592 kg)  09/20/11 136 lb (61.689 kg)     Lab Results  Component Value Date   WBC 8.9 04/17/2013   HGB 11.2* 04/17/2013   HCT 34.8* 04/17/2013   PLT 277.0 04/17/2013   GLUCOSE 87 04/17/2013   CHOL 221* 04/17/2013   TRIG 56.0 04/17/2013   HDL  79.80 04/17/2013   LDLDIRECT 129.0 04/17/2013   LDLCALC 69 09/18/2011   ALT 17 04/17/2013   AST 22 04/17/2013   NA 137 04/17/2013   K 4.4 04/17/2013   CL 102 04/17/2013   CREATININE 0.8 04/17/2013   BUN 17 04/17/2013   CO2 29 04/17/2013   TSH 2.24 04/17/2013    No results found.     Assessment & Plan:   CPX/z00.00 - Patient has been counseled on age-appropriate routine health concerns for screening and prevention. These are reviewed and up-to-date. Immunizations are up-to-date or declined. Labs  ordered and reviewed.  Problem List Items Addressed This Visit    None    Visit Diagnoses    Routine general medical examination at a health care facility    -  Primary    Relevant Orders    Basic metabolic panel    CBC with Differential/Platelet    Hepatic function panel    Lipid panel    TSH    Urinalysis, Routine w reflex microscopic        Gwendolyn Grant, MD

## 2014-09-02 NOTE — Progress Notes (Signed)
Pre visit review using our clinic review tool, if applicable. No additional management support is needed unless otherwise documented below in the visit note. 

## 2014-09-03 MED ORDER — CIPROFLOXACIN HCL 500 MG PO TABS: 500.0000 mg | ORAL_TABLET | Freq: Two times a day (BID) | ORAL | Status: AC

## 2014-09-03 NOTE — Addendum Note (Signed)
Addended by: Gwendolyn Grant A on: 09/03/2014 08:25 AM   Modules accepted: Orders, SmartSet

## 2015-02-11 ENCOUNTER — Other Ambulatory Visit: Payer: Self-pay | Admitting: Internal Medicine

## 2015-11-15 ENCOUNTER — Other Ambulatory Visit: Payer: Self-pay | Admitting: Internal Medicine

## 2016-01-11 ENCOUNTER — Telehealth: Payer: Self-pay | Admitting: Emergency Medicine

## 2016-01-11 MED ORDER — PAROXETINE HCL 10 MG PO TABS: 20.0000 mg | ORAL_TABLET | Freq: Every day | ORAL | 0 refills | Status: DC

## 2016-01-11 NOTE — Telephone Encounter (Signed)
Pt called and has an appt 9/15 to transfer care. She wants to know if you can refill her prescription PARoxetine (PAXIL) 10 MG tablet until she os able to get to her appt. Pharmacy is [facility] [address] Please advise thanks.

## 2016-01-11 NOTE — Telephone Encounter (Signed)
Please advise, last fill 02/11/15 #[phone] with 2 refills

## 2016-01-11 NOTE — Telephone Encounter (Signed)
Ok to fill 

## 2016-01-11 NOTE — Telephone Encounter (Signed)
RX sent to [facility] 

## 2016-01-27 NOTE — Progress Notes (Signed)
Subjective:    Patient ID: Marie Adams, female    DOB: 08-15-1970, 45 y.o.   MRN: [id number]  HPI She is here to establish with a new pcp.  She is here for a physical exam.   A few months ago she was experiencing intermittent right upper quadrant pain and pain in the right shoulder area. She thought this was likely related to her gallbladder. She was not evaluated for this. She has changed her diet and is eating better and has not had any symptoms since.  Anxiety: She is taking her medication daily as prescribed. She denies any side effects from the medication. She feels her anxiety is well controlled and she is happy with her current dose of medication.   She denies any changes in her medical history or family history since she was here last.    Medications and allergies reviewed with patient and updated if appropriate.  Patient Active Problem List   Diagnosis Date Noted  . Anxiety 03/02/2009    No current outpatient prescriptions on file prior to visit.   No current facility-administered medications on file prior to visit.     Past Medical History:  Diagnosis Date  . ANXIETY     Past Surgical History:  Procedure Laterality Date  . BREAST SURGERY  1990   Breast biopsy - benign    Social History   Social History  . Marital status: Single    Spouse name: N/A  . Number of children: N/A  . Years of education: N/A   Social History Main Topics  . Smoking status: Never Smoker  . Smokeless tobacco: Never Used  . Alcohol use 0.0 oz/week     Comment: 1 glass of wine nightly  . Drug use: No  . Sexual activity: Not Asked   Other Topics Concern  . None   Social History Narrative   Lives with wife [name]) 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      Exercise: treadmill    Family History  Problem Relation Age of Onset  . Arthritis Maternal Grandmother   . Diabetes Father     borderline  . Hyperlipidemia Father   . Hypertension Father   . Transient ischemic attack  Paternal Grandfather   . Hypertension Mother     Review of Systems  Constitutional: Negative for appetite change, chills, fatigue, fever and unexpected weight change.  HENT: Negative for hearing loss.   Eyes: Negative for visual disturbance.  Respiratory: Negative for cough, shortness of breath and wheezing.   Cardiovascular: Negative for chest pain, palpitations and leg swelling.  Gastrointestinal: Negative for abdominal pain, blood in stool, constipation, diarrhea and nausea.       Gerd occasional  Genitourinary: Negative for dysuria and hematuria.  Musculoskeletal: Negative for arthralgias and back pain.  Skin: Negative for color change and rash.  Neurological: Negative for dizziness, light-headedness and headaches.  Psychiatric/Behavioral: Negative for dysphoric mood. The patient is nervous/anxious.        Objective:   Vitals:   01/28/16 1023  BP: 124/80  Pulse: 87  Resp: 16  Temp: 98.1 F (36.7 C)   Filed Weights   01/28/16 1023  Weight: 153 lb (69.4 kg)   Body mass index is 25.46 kg/m.   Physical Exam Constitutional: She appears well-developed and well-nourished. No distress.  HENT:  Head: Normocephalic and atraumatic.  Right Ear: External ear normal. Normal ear canal and TM Left Ear: External ear normal.  Normal ear canal and TM Mouth/Throat: Oropharynx is  clear and moist.  Eyes: Conjunctivae and EOM are normal.  Neck: Neck supple. No tracheal deviation present. No thyromegaly present.  No carotid bruit  Cardiovascular: Normal rate, regular rhythm and normal heart sounds.   No murmur heard.  No edema. Pulmonary/Chest: Effort normal and breath sounds normal. No respiratory distress. She has no wheezes. She has no rales.  Breast: deferred to Gyn Abdominal: Soft. She exhibits no distension. There is no tenderness.  Lymphadenopathy: She has no cervical adenopathy.  Skin: Skin is warm and dry. She is not diaphoretic.  Psychiatric: She has a normal mood and  affect. Her behavior is normal.       Assessment & Plan:   Physical exam: Screening blood work  ordered Immunizations flu vaccine today, td up to date Mammogram Up to date  Gyn  Up to date  Exercise - regular - restarted recently Weight - BMI just above normal - continue regular exercise Skin  - no concerns Substance abuse  none  See Problem List for Assessment and Plan of chronic medical problems.  Follow up annually

## 2016-01-28 ENCOUNTER — Ambulatory Visit (INDEPENDENT_AMBULATORY_CARE_PROVIDER_SITE_OTHER): Payer: 59 | Admitting: Internal Medicine

## 2016-01-28 ENCOUNTER — Encounter: Payer: Self-pay | Admitting: Internal Medicine

## 2016-01-28 VITALS — BP 124/80 | HR 87 | Temp 98.1°F | Resp 16 | Ht 65.0 in | Wt 153.0 lb

## 2016-01-28 DIAGNOSIS — Z0000 Encounter for general adult medical examination without abnormal findings: Secondary | ICD-10-CM

## 2016-01-28 DIAGNOSIS — Z23 Encounter for immunization: Secondary | ICD-10-CM | POA: Diagnosis not present

## 2016-01-28 DIAGNOSIS — F419 Anxiety disorder, unspecified: Secondary | ICD-10-CM | POA: Diagnosis not present

## 2016-01-28 MED ORDER — PAROXETINE HCL 10 MG PO TABS: 20.0000 mg | ORAL_TABLET | Freq: Every day | ORAL | 3 refills | Status: DC

## 2016-01-28 NOTE — Progress Notes (Signed)
Pre visit review using our clinic review tool, if applicable. No additional management support is needed unless otherwise documented below in the visit note. 

## 2016-01-28 NOTE — Assessment & Plan Note (Signed)
Controlled, stable Continue current dose of medication  

## 2016-01-28 NOTE — Patient Instructions (Addendum)
Test(s) ordered today. Your results will be released to MyChart (or called to you) after review, usually within 72hours after test completion. If any changes need to be made, you will be notified at that same time.  All other Health Maintenance issues reviewed.   All recommended immunizations and age-appropriate screenings are up-to-date or discussed.  Flu vaccine administered today.   Medications reviewed and updated.  No changes recommended at this time.  Your prescription(s) have been submitted to your pharmacy. Please take as directed and contact our office if you believe you are having problem(s) with the medication(s).   Please followup in one year for a physical   Health Maintenance, Female Adopting a healthy lifestyle and getting preventive care can go a long way to promote health and wellness. Talk with your health care provider about what schedule of regular examinations is right for you. This is a good chance for you to check in with your provider about disease prevention and staying healthy. In between checkups, there are plenty of things you can do on your own. Experts have done a lot of research about which lifestyle changes and preventive measures are most likely to keep you healthy. Ask your health care provider for more information. WEIGHT AND DIET  Eat a healthy diet  Be sure to include plenty of vegetables, fruits, low-fat dairy products, and lean protein.  Do not eat a lot of foods high in solid fats, added sugars, or salt.  Get regular exercise. This is one of the most important things you can do for your health.  Most adults should exercise for at least 150 minutes each week. The exercise should increase your heart rate and make you sweat (moderate-intensity exercise).  Most adults should also do strengthening exercises at least twice a week. This is in addition to the moderate-intensity exercise.  Maintain a healthy weight  Body mass index (BMI) is a  measurement that can be used to identify possible weight problems. It estimates body fat based on height and weight. Your health care provider can help determine your BMI and help you achieve or maintain a healthy weight.  For females 20 years of age and older:   A BMI below 18.5 is considered underweight.  A BMI of 18.5 to 24.9 is normal.  A BMI of 25 to 29.9 is considered overweight.  A BMI of 30 and above is considered obese.  Watch levels of cholesterol and blood lipids  You should start having your blood tested for lipids and cholesterol at 46 years of age, then have this test every 5 years.  You may need to have your cholesterol levels checked more often if:  Your lipid or cholesterol levels are high.  You are older than 46 years of age.  You are at high risk for heart disease.  CANCER SCREENING   Lung Cancer  Lung cancer screening is recommended for adults 55-80 years old who are at high risk for lung cancer because of a history of smoking.  A yearly low-dose CT scan of the lungs is recommended for people who:  Currently smoke.  Have quit within the past 15 years.  Have at least a 30-pack-year history of smoking. A pack year is smoking an average of one pack of cigarettes a day for 1 year.  Yearly screening should continue until it has been 15 years since you quit.  Yearly screening should stop if you develop a health problem that would prevent you from having lung cancer treatment.    Breast Cancer  Practice breast self-awareness. This means understanding how your breasts normally appear and feel.  It also means doing regular breast self-exams. Let your health care provider know about any changes, no matter how small.  If you are in your 20s or 30s, you should have a clinical breast exam (CBE) by a health care provider every 1-3 years as part of a regular health exam.  If you are 40 or older, have a CBE every year. Also consider having a breast X-ray  (mammogram) every year.  If you have a family history of breast cancer, talk to your health care provider about genetic screening.  If you are at high risk for breast cancer, talk to your health care provider about having an MRI and a mammogram every year.  Breast cancer gene (BRCA) assessment is recommended for women who have family members with BRCA-related cancers. BRCA-related cancers include:  Breast.  Ovarian.  Tubal.  Peritoneal cancers.  Results of the assessment will determine the need for genetic counseling and BRCA1 and BRCA2 testing. Cervical Cancer Your health care provider may recommend that you be screened regularly for cancer of the pelvic organs (ovaries, uterus, and vagina). This screening involves a pelvic examination, including checking for microscopic changes to the surface of your cervix (Pap test). You may be encouraged to have this screening done every 3 years, beginning at age 21.  For women ages 30-65, health care providers may recommend pelvic exams and Pap testing every 3 years, or they may recommend the Pap and pelvic exam, combined with testing for human papilloma virus (HPV), every 5 years. Some types of HPV increase your risk of cervical cancer. Testing for HPV may also be done on women of any age with unclear Pap test results.  Other health care providers may not recommend any screening for nonpregnant women who are considered low risk for pelvic cancer and who do not have symptoms. Ask your health care provider if a screening pelvic exam is right for you.  If you have had past treatment for cervical cancer or a condition that could lead to cancer, you need Pap tests and screening for cancer for at least 20 years after your treatment. If Pap tests have been discontinued, your risk factors (such as having a new sexual partner) need to be reassessed to determine if screening should resume. Some women have medical problems that increase the chance of getting  cervical cancer. In these cases, your health care provider may recommend more frequent screening and Pap tests. Colorectal Cancer  This type of cancer can be detected and often prevented.  Routine colorectal cancer screening usually begins at 46 years of age and continues through 46 years of age.  Your health care provider may recommend screening at an earlier age if you have risk factors for colon cancer.  Your health care provider may also recommend using home test kits to check for hidden blood in the stool.  A small camera at the end of a tube can be used to examine your colon directly (sigmoidoscopy or colonoscopy). This is done to check for the earliest forms of colorectal cancer.  Routine screening usually begins at age 50.  Direct examination of the colon should be repeated every 5-10 years through 46 years of age. However, you may need to be screened more often if early forms of precancerous polyps or small growths are found. Skin Cancer  Check your skin from head to toe regularly.  Tell your health care   provider about any new moles or changes in moles, especially if there is a change in a mole's shape or color.  Also tell your health care provider if you have a mole that is larger than the size of a pencil eraser.  Always use sunscreen. Apply sunscreen liberally and repeatedly throughout the day.  Protect yourself by wearing long sleeves, pants, a wide-brimmed hat, and sunglasses whenever you are outside. HEART DISEASE, DIABETES, AND HIGH BLOOD PRESSURE   High blood pressure causes heart disease and increases the risk of stroke. High blood pressure is more likely to develop in:  People who have blood pressure in the high end of the normal range (130-139/85-89 mm Hg).  People who are overweight or obese.  People who are African American.  If you are 18-39 years of age, have your blood pressure checked every 3-5 years. If you are 40 years of age or older, have your blood  pressure checked every year. You should have your blood pressure measured twice--once when you are at a hospital or clinic, and once when you are not at a hospital or clinic. Record the average of the two measurements. To check your blood pressure when you are not at a hospital or clinic, you can use:  An automated blood pressure machine at a pharmacy.  A home blood pressure monitor.  If you are between 55 years and 79 years old, ask your health care provider if you should take aspirin to prevent strokes.  Have regular diabetes screenings. This involves taking a blood sample to check your fasting blood sugar level.  If you are at a normal weight and have a low risk for diabetes, have this test once every three years after 45 years of age.  If you are overweight and have a high risk for diabetes, consider being tested at a younger age or more often. PREVENTING INFECTION  Hepatitis B  If you have a higher risk for hepatitis B, you should be screened for this virus. You are considered at high risk for hepatitis B if:  You were born in a country where hepatitis B is common. Ask your health care provider which countries are considered high risk.  Your parents were born in a high-risk country, and you have not been immunized against hepatitis B (hepatitis B vaccine).  You have HIV or AIDS.  You use needles to inject street drugs.  You live with someone who has hepatitis B.  You have had sex with someone who has hepatitis B.  You get hemodialysis treatment.  You take certain medicines for conditions, including cancer, organ transplantation, and autoimmune conditions. Hepatitis C  Blood testing is recommended for:  Everyone born from 1945 through 1965.  Anyone with known risk factors for hepatitis C. Sexually transmitted infections (STIs)  You should be screened for sexually transmitted infections (STIs) including gonorrhea and chlamydia if:  You are sexually active and are  younger than 46 years of age.  You are older than 46 years of age and your health care provider tells you that you are at risk for this type of infection.  Your sexual activity has changed since you were last screened and you are at an increased risk for chlamydia or gonorrhea. Ask your health care provider if you are at risk.  If you do not have HIV, but are at risk, it may be recommended that you take a prescription medicine daily to prevent HIV infection. This is called pre-exposure prophylaxis (PrEP). You are   considered at risk if:  You are sexually active and do not regularly use condoms or know the HIV status of your partner(s).  You take drugs by injection.  You are sexually active with a partner who has HIV. Talk with your health care provider about whether you are at high risk of being infected with HIV. If you choose to begin PrEP, you should first be tested for HIV. You should then be tested every 3 months for as long as you are taking PrEP.  PREGNANCY   If you are premenopausal and you may become pregnant, ask your health care provider about preconception counseling.  If you may become pregnant, take 400 to 800 micrograms (mcg) of folic acid every day.  If you want to prevent pregnancy, talk to your health care provider about birth control (contraception). OSTEOPOROSIS AND MENOPAUSE   Osteoporosis is a disease in which the bones lose minerals and strength with aging. This can result in serious bone fractures. Your risk for osteoporosis can be identified using a bone density scan.  If you are 65 years of age or older, or if you are at risk for osteoporosis and fractures, ask your health care provider if you should be screened.  Ask your health care provider whether you should take a calcium or vitamin D supplement to lower your risk for osteoporosis.  Menopause may have certain physical symptoms and risks.  Hormone replacement therapy may reduce some of these symptoms and  risks. Talk to your health care provider about whether hormone replacement therapy is right for you.  HOME CARE INSTRUCTIONS   Schedule regular health, dental, and eye exams.  Stay current with your immunizations.   Do not use any tobacco products including cigarettes, chewing tobacco, or electronic cigarettes.  If you are pregnant, do not drink alcohol.  If you are breastfeeding, limit how much and how often you drink alcohol.  Limit alcohol intake to no more than 1 drink per day for nonpregnant women. One drink equals 12 ounces of beer, 5 ounces of wine, or 1 ounces of hard liquor.  Do not use street drugs.  Do not share needles.  Ask your health care provider for help if you need support or information about quitting drugs.  Tell your health care provider if you often feel depressed.  Tell your health care provider if you have ever been abused or do not feel safe at home.   This information is not intended to replace advice given to you by your health care provider. Make sure you discuss any questions you have with your health care provider.   Document Released: 11/14/2010 Document Revised: 05/22/2014 Document Reviewed: 04/02/2013 [employer] Interactive Patient Education 2016 [employer]  

## 2016-02-25 ENCOUNTER — Other Ambulatory Visit (INDEPENDENT_AMBULATORY_CARE_PROVIDER_SITE_OTHER): Payer: 59

## 2016-02-25 DIAGNOSIS — Z0000 Encounter for general adult medical examination without abnormal findings: Secondary | ICD-10-CM

## 2016-02-25 LAB — CBC WITH DIFFERENTIAL/PLATELET
Basophils Absolute: 0 10*3/uL (ref 0.0–0.1)
Basophils Relative: 0.4 % (ref 0.0–3.0)
EOS PCT: 3.5 % (ref 0.0–5.0)
Eosinophils Absolute: 0.3 10*3/uL (ref 0.0–0.7)
HCT: 35 % — ABNORMAL LOW (ref 36.0–46.0)
Hemoglobin: 11.4 g/dL — ABNORMAL LOW (ref 12.0–15.0)
LYMPHS ABS: 1.7 10*3/uL (ref 0.7–4.0)
Lymphocytes Relative: 22.2 % (ref 12.0–46.0)
MCHC: 32.5 g/dL (ref 30.0–36.0)
MCV: 72.2 fl — AB (ref 78.0–100.0)
MONOS PCT: 10.4 % (ref 3.0–12.0)
Monocytes Absolute: 0.8 10*3/uL (ref 0.1–1.0)
NEUTROS ABS: 5 10*3/uL (ref 1.4–7.7)
NEUTROS PCT: 63.5 % (ref 43.0–77.0)
PLATELETS: 344 10*3/uL (ref 150.0–400.0)
RBC: 4.84 Mil/uL (ref 3.87–5.11)
RDW: 17.2 % — ABNORMAL HIGH (ref 11.5–15.5)
WBC: 7.9 10*3/uL (ref 4.0–10.5)

## 2016-02-25 LAB — COMPREHENSIVE METABOLIC PANEL
ALT: 10 U/L (ref 0–35)
AST: 17 U/L (ref 0–37)
Albumin: 4.5 g/dL (ref 3.5–5.2)
Alkaline Phosphatase: 46 U/L (ref 39–117)
BUN: 13 mg/dL (ref 6–23)
CHLORIDE: 103 meq/L (ref 96–112)
CO2: 31 meq/L (ref 19–32)
Calcium: 9.7 mg/dL (ref 8.4–10.5)
Creatinine, Ser: 0.79 mg/dL (ref 0.40–1.20)
GFR: 83.22 mL/min (ref >60.00)
GLUCOSE: 91 mg/dL (ref 70–99)
POTASSIUM: 4.7 meq/L (ref 3.5–5.1)
Sodium: 140 mEq/L (ref 135–145)
Total Bilirubin: 0.6 mg/dL (ref 0.2–1.2)
Total Protein: 7.4 g/dL (ref 6.0–8.3)

## 2016-02-25 LAB — LIPID PANEL
CHOL/HDL RATIO: 3
Cholesterol: 197 mg/dL (ref 0–200)
HDL: 75.7 mg/dL (ref >39.00)
LDL CALC: 105 mg/dL — AB (ref 0–99)
NONHDL: 121.38
Triglycerides: 84 mg/dL (ref 0.0–149.0)
VLDL: 16.8 mg/dL (ref 0.0–40.0)

## 2016-02-25 LAB — TSH: TSH: 2.14 u[IU]/mL (ref 0.35–4.50)

## 2016-02-26 ENCOUNTER — Encounter: Payer: Self-pay | Admitting: Internal Medicine

## 2016-07-05 DIAGNOSIS — M542 Cervicalgia: Secondary | ICD-10-CM | POA: Diagnosis not present

## 2016-07-05 DIAGNOSIS — M791 Myalgia: Secondary | ICD-10-CM | POA: Diagnosis not present

## 2016-08-21 ENCOUNTER — Other Ambulatory Visit: Payer: Self-pay | Admitting: *Deleted

## 2016-08-21 MED ORDER — PAROXETINE HCL 10 MG PO TABS
20.0000 mg | ORAL_TABLET | Freq: Every day | ORAL | 2 refills | Status: DC
Start: 2016-08-21 — End: 2017-05-25

## 2016-08-24 DIAGNOSIS — Z1231 Encounter for screening mammogram for malignant neoplasm of breast: Secondary | ICD-10-CM | POA: Diagnosis not present

## 2016-08-24 DIAGNOSIS — Z01419 Encounter for gynecological examination (general) (routine) without abnormal findings: Secondary | ICD-10-CM | POA: Diagnosis not present

## 2016-08-24 DIAGNOSIS — Z1212 Encounter for screening for malignant neoplasm of rectum: Secondary | ICD-10-CM | POA: Diagnosis not present

## 2017-02-17 IMAGING — US US ABDOMEN COMPLETE
1 series · 13 of 25 positions shown · non-contrast
Comparison: None in PACs

CLINICAL DATA: Intermittent right upper quadrant pain for several
years. Patient has tenderness on exam.

EXAM:
ABDOMEN ULTRASOUND COMPLETE

[Series 1: us abdomen complete · 0.17mm/px · 13 of 97 slices shown]
[im 1/97]
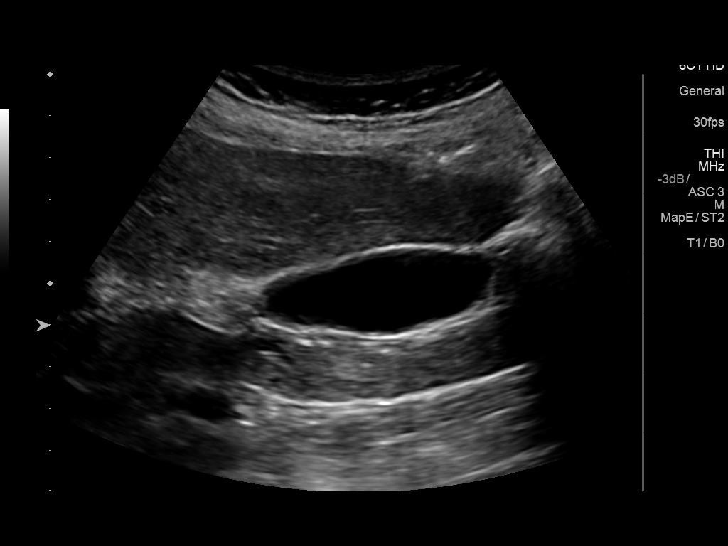
[im 9/97]
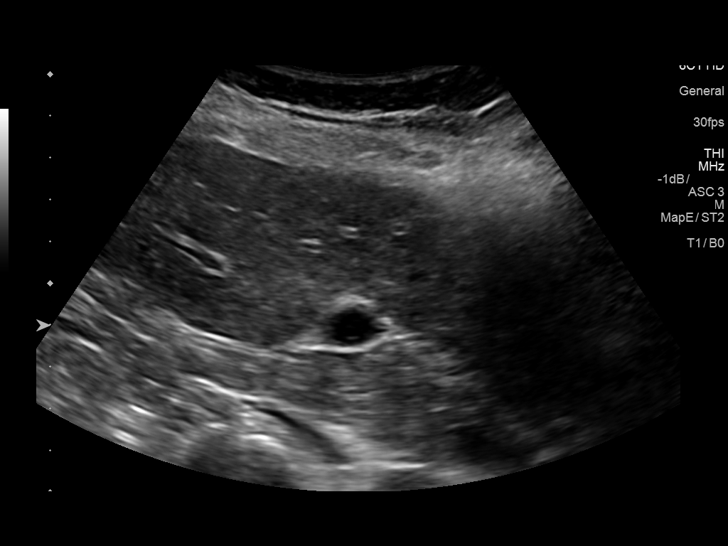
[im 17/97]
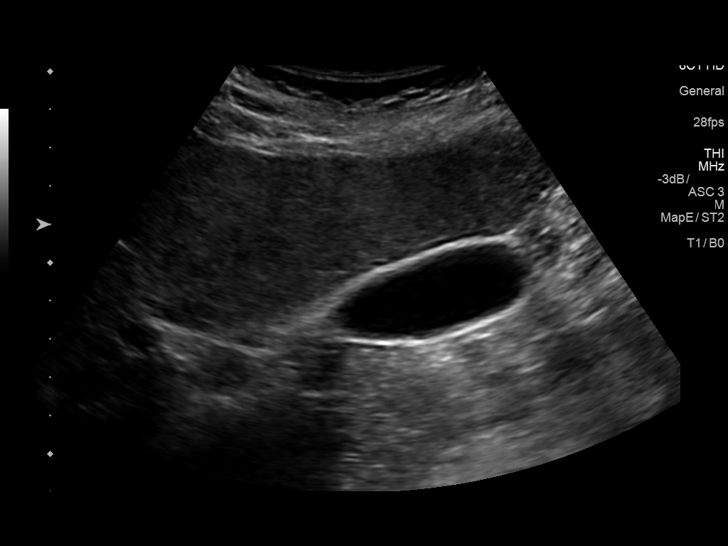
[im 25/97]
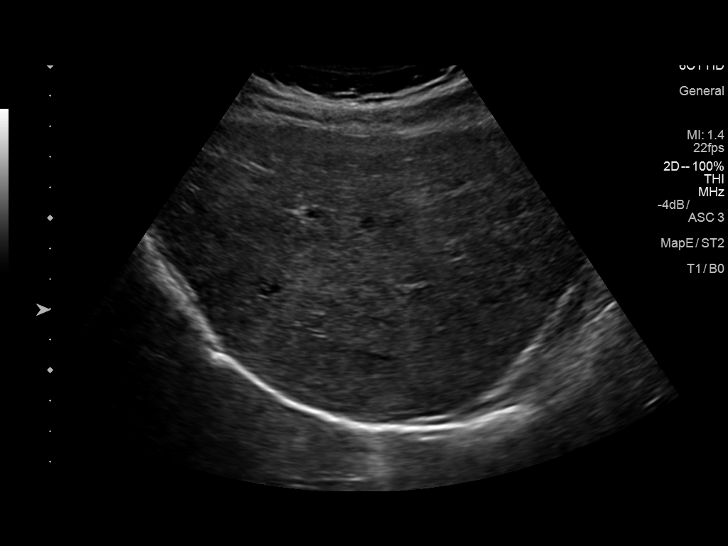
[im 33/97]
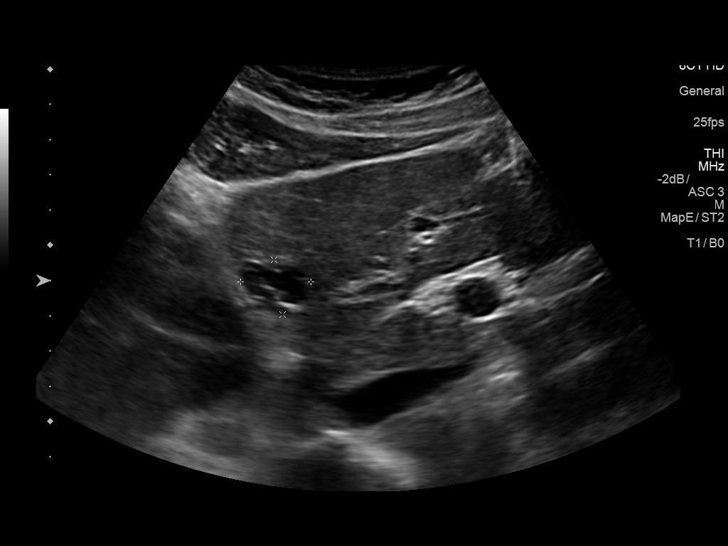
[im 41/97]
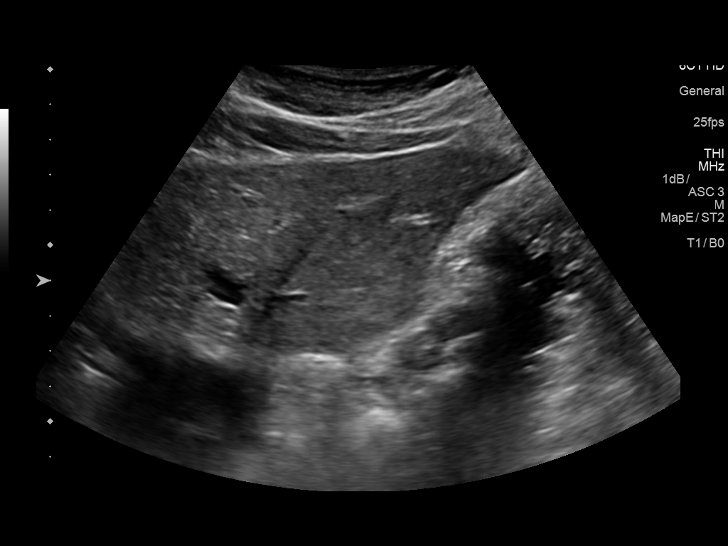
[im 49/97]
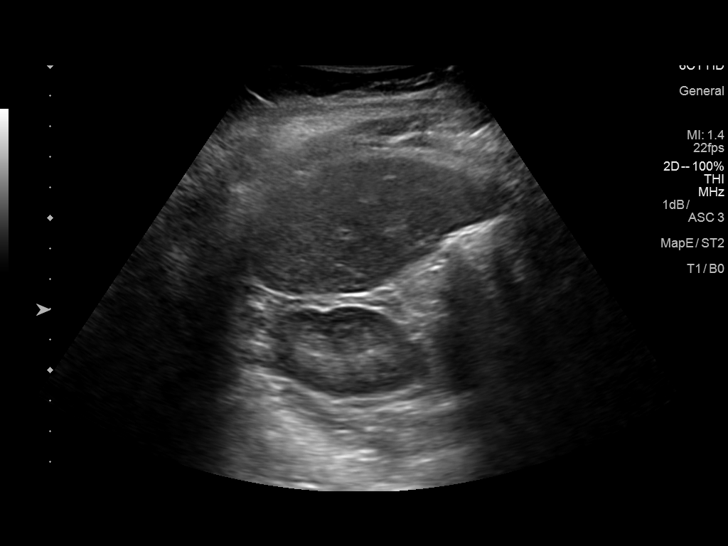
[im 57/97]
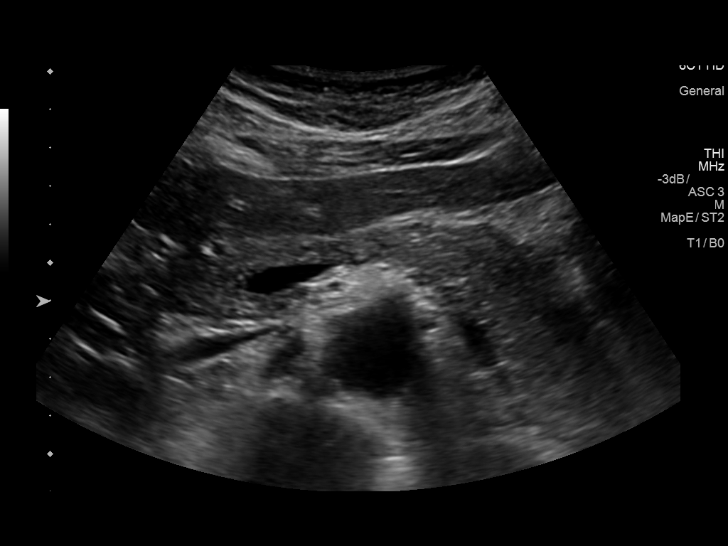
[im 65/97]
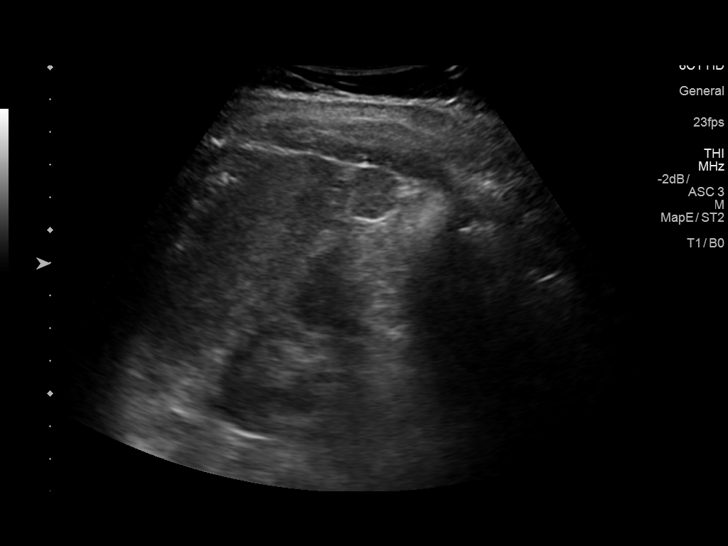
[im 73/97]
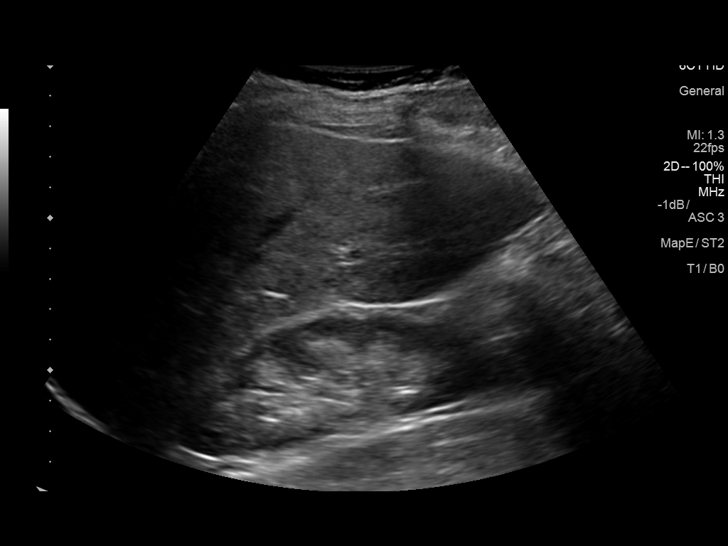
[im 81/97]
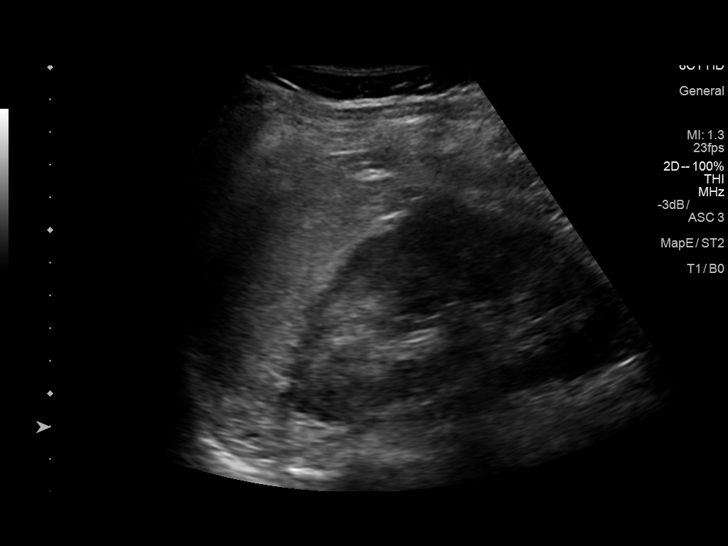
[im 89/97]
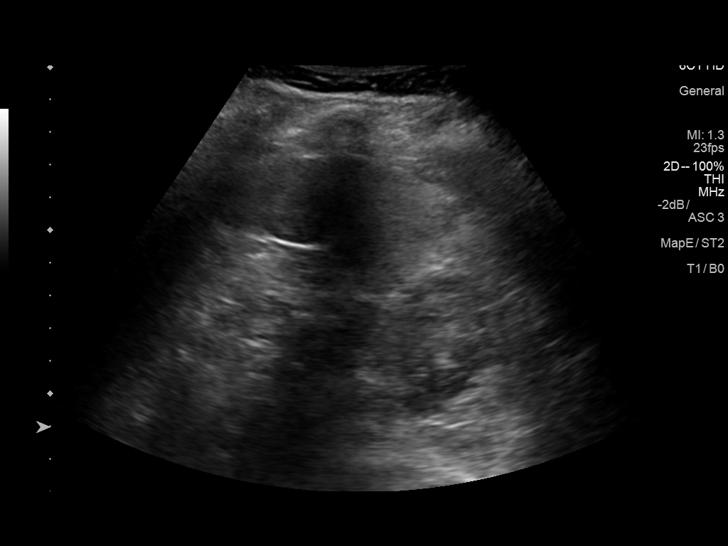
[im 97/97]
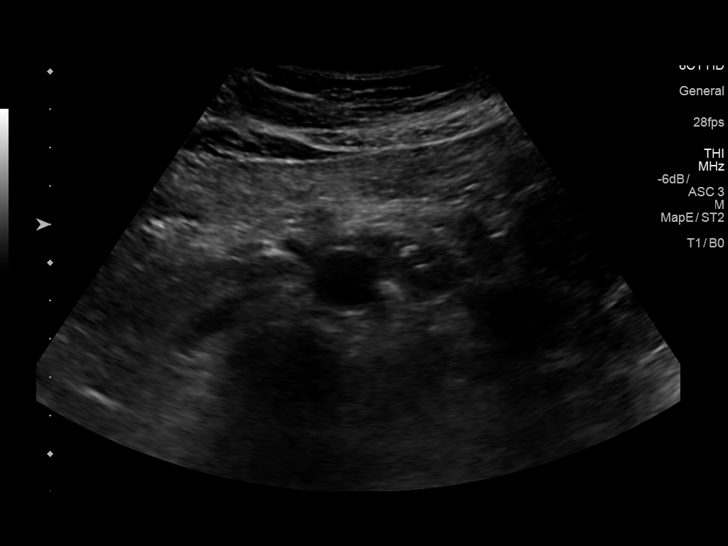

[13 of 25 positions shown; findings below may reference images not displayed]

FINDINGS: Gallbladder: No gallstones or wall thickening visualized. No
sonographic Murphy sign noted by sonographer.

Common bile duct: Diameter: 2.5 mm

Liver: Within normal limits in parenchymal echogenicity. There is a
complex cystic structure in the medial aspect of the right lobe. It
measures 2 x 1.6 x 2.8 cm. The surface contour of the liver is
normal. No solid-appearing masses are observed. Portal vein is
patent on color Doppler imaging with normal direction of blood flow
towards the liver.

IVC: No abnormality visualized.

Pancreas: Visualized portion unremarkable.

Spleen: Size and appearance within normal limits. There is a
probable accessory spleen measuring 1.9 cm in diameter.

Right Kidney: Length: 11.2 cm. There is mild cortical thinning
diffusely. The cortical echotexture remains lower than that of the
liver. There is no hydronephrosis.

Left Kidney: Length: 11.0 cm. There is no significant cortical
thinning observed. The cortical echotexture is similar to that on
the right. There is no hydronephrosis.

Abdominal aorta: No aneurysm visualized.

Other findings: There is no ascites.
IMPRESSION: No gallstones or sonographic evidence of acute cholecystitis. If
there are clinical concerns of chronic cholecystitis, a nuclear
medicine hepatobiliary scan with gallbladder ejection fraction
determination may be useful.

Non simple appearing cystic structure in the inferior aspect of the
right hepatic lobe. Follow-up ultrasound in 6 months is recommended.

Cortical thinning of the right kidney with preservation of the
cortical echotexture. This may reflect early ischemic change but the
renal length is preserved. Normal appearing left kidney.

## 2017-02-20 DIAGNOSIS — Z23 Encounter for immunization: Secondary | ICD-10-CM | POA: Diagnosis not present

## 2017-05-02 DIAGNOSIS — D3131 Benign neoplasm of right choroid: Secondary | ICD-10-CM | POA: Diagnosis not present

## 2017-05-25 ENCOUNTER — Other Ambulatory Visit: Payer: Self-pay | Admitting: Internal Medicine

## 2017-06-25 ENCOUNTER — Other Ambulatory Visit: Payer: Self-pay | Admitting: Emergency Medicine

## 2017-06-25 MED ORDER — PAROXETINE HCL 10 MG PO TABS: 20.0000 mg | ORAL_TABLET | Freq: Every day | ORAL | 0 refills | Status: DC

## 2017-06-27 NOTE — Patient Instructions (Addendum)
Test(s) ordered today. Your results will be released to [place] (or called to you) after review, usually within 72hours after test completion. If any changes need to be made, you will be notified at that same time.  All other Health Maintenance issues reviewed.   All recommended immunizations and age-appropriate screenings are up-to-date or discussed.  No immunizations administered today.   Medications reviewed and updated.  Changes include adding zantac once daily as needed for heartburn.   Your prescription(s) have been submitted to your pharmacy. Please take as directed and contact our office if you believe you are having problem(s) with the medication(s).  An abdominal Ultrasound was ordered - some will call you to schedule this.   Please followup in one year   Health Maintenance, Female Adopting a healthy lifestyle and getting preventive care can go a long way to promote health and wellness. Talk with your health care provider about what schedule of regular examinations is right for you. This is a good chance for you to check in with your provider about disease prevention and staying healthy. In between checkups, there are plenty of things you can do on your own. Experts have done a lot of research about which lifestyle changes and preventive measures are most likely to keep you healthy. Ask your health care provider for more information. Weight and diet Eat a healthy diet  Be sure to include plenty of vegetables, fruits, low-fat dairy products, and lean protein.  Do not eat a lot of foods high in solid fats, added sugars, or salt.  Get regular exercise. This is one of the most important things you can do for your health. ? Most adults should exercise for at least 150 minutes each week. The exercise should increase your heart rate and make you sweat (moderate-intensity exercise). ? Most adults should also do strengthening exercises at least twice a week. This is in addition to the  moderate-intensity exercise.  Maintain a healthy weight  Body mass index (BMI) is a measurement that can be used to identify possible weight problems. It estimates body fat based on height and weight. Your health care provider can help determine your BMI and help you achieve or maintain a healthy weight.  For females 68 years of age and older: ? A BMI below 18.5 is considered underweight. ? A BMI of 18.5 to 24.9 is normal. ? A BMI of 25 to 29.9 is considered overweight. ? A BMI of 30 and above is considered obese.  Watch levels of cholesterol and blood lipids  You should start having your blood tested for lipids and cholesterol at 48 years of age, then have this test every 5 years.  You may need to have your cholesterol levels checked more often if: ? Your lipid or cholesterol levels are high. ? You are older than 48 years of age. ? You are at high risk for heart disease.  Cancer screening Lung Cancer  Lung cancer screening is recommended for adults 42-3 years old who are at high risk for lung cancer because of a history of smoking.  A yearly low-dose CT scan of the lungs is recommended for people who: ? Currently smoke. ? Have quit within the past 15 years. ? Have at least a 30-pack-year history of smoking. A pack year is smoking an average of one pack of cigarettes a day for 1 year.  Yearly screening should continue until it has been 15 years since you quit.  Yearly screening should stop if you develop  a health problem that would prevent you from having lung cancer treatment.  Breast Cancer  Practice breast self-awareness. This means understanding how your breasts normally appear and feel.  It also means doing regular breast self-exams. Let your health care provider know about any changes, no matter how small.  If you are in your 20s or 30s, you should have a clinical breast exam (CBE) by a health care provider every 1-3 years as part of a regular health exam.  If you  are 63 or older, have a CBE every year. Also consider having a breast X-ray (mammogram) every year.  If you have a family history of breast cancer, talk to your health care provider about genetic screening.  If you are at high risk for breast cancer, talk to your health care provider about having an MRI and a mammogram every year.  Breast cancer gene (BRCA) assessment is recommended for women who have family members with BRCA-related cancers. BRCA-related cancers include: ? Breast. ? Ovarian. ? Tubal. ? Peritoneal cancers.  Results of the assessment will determine the need for genetic counseling and BRCA1 and BRCA2 testing.  Cervical Cancer Your health care provider may recommend that you be screened regularly for cancer of the pelvic organs (ovaries, uterus, and vagina). This screening involves a pelvic examination, including checking for microscopic changes to the surface of your cervix (Pap test). You may be encouraged to have this screening done every 3 years, beginning at age 77.  For women ages 39-65, health care providers may recommend pelvic exams and Pap testing every 3 years, or they may recommend the Pap and pelvic exam, combined with testing for human papilloma virus (HPV), every 5 years. Some types of HPV increase your risk of cervical cancer. Testing for HPV may also be done on women of any age with unclear Pap test results.  Other health care providers may not recommend any screening for nonpregnant women who are considered low risk for pelvic cancer and who do not have symptoms. Ask your health care provider if a screening pelvic exam is right for you.  If you have had past treatment for cervical cancer or a condition that could lead to cancer, you need Pap tests and screening for cancer for at least 20 years after your treatment. If Pap tests have been discontinued, your risk factors (such as having a new sexual partner) need to be reassessed to determine if screening should  resume. Some women have medical problems that increase the chance of getting cervical cancer. In these cases, your health care provider may recommend more frequent screening and Pap tests.  Colorectal Cancer  This type of cancer can be detected and often prevented.  Routine colorectal cancer screening usually begins at 48 years of age and continues through 48 years of age.  Your health care provider may recommend screening at an earlier age if you have risk factors for colon cancer.  Your health care provider may also recommend using home test kits to check for hidden blood in the stool.  A small camera at the end of a tube can be used to examine your colon directly (sigmoidoscopy or colonoscopy). This is done to check for the earliest forms of colorectal cancer.  Routine screening usually begins at age 75.  Direct examination of the colon should be repeated every 5-10 years through 48 years of age. However, you may need to be screened more often if early forms of precancerous polyps or small growths are found.  Skin Cancer  Check your skin from head to toe regularly.  Tell your health care provider about any new moles or changes in moles, especially if there is a change in a mole's shape or color.  Also tell your health care provider if you have a mole that is larger than the size of a pencil eraser.  Always use sunscreen. Apply sunscreen liberally and repeatedly throughout the day.  Protect yourself by wearing long sleeves, pants, a wide-brimmed hat, and sunglasses whenever you are outside.  Heart disease, diabetes, and high blood pressure  High blood pressure causes heart disease and increases the risk of stroke. High blood pressure is more likely to develop in: ? People who have blood pressure in the high end of the normal range (130-139/85-89 mm Hg). ? People who are overweight or obese. ? People who are African American.  If you are 59-25 years of age, have your blood  pressure checked every 3-5 years. If you are 90 years of age or older, have your blood pressure checked every year. You should have your blood pressure measured twice-once when you are at a hospital or clinic, and once when you are not at a hospital or clinic. Record the average of the two measurements. To check your blood pressure when you are not at a hospital or clinic, you can use: ? An automated blood pressure machine at a pharmacy. ? A home blood pressure monitor.  If you are between 80 years and 15 years old, ask your health care provider if you should take aspirin to prevent strokes.  Have regular diabetes screenings. This involves taking a blood sample to check your fasting blood sugar level. ? If you are at a normal weight and have a low risk for diabetes, have this test once every three years after 48 years of age. ? If you are overweight and have a high risk for diabetes, consider being tested at a younger age or more often. Preventing infection Hepatitis B  If you have a higher risk for hepatitis B, you should be screened for this virus. You are considered at high risk for hepatitis B if: ? You were born in a country where hepatitis B is common. Ask your health care provider which countries are considered high risk. ? Your parents were born in a high-risk country, and you have not been immunized against hepatitis B (hepatitis B vaccine). ? You have HIV or AIDS. ? You use needles to inject street drugs. ? You live with someone who has hepatitis B. ? You have had sex with someone who has hepatitis B. ? You get hemodialysis treatment. ? You take certain medicines for conditions, including cancer, organ transplantation, and autoimmune conditions.  Hepatitis C  Blood testing is recommended for: ? Everyone born from [birth year] through 1966. ? Anyone with known risk factors for hepatitis C.  Sexually transmitted infections (STIs)  You should be screened for sexually transmitted  infections (STIs) including gonorrhea and chlamydia if: ? You are sexually active and are younger than 48 years of age. ? You are older than 48 years of age and your health care provider tells you that you are at risk for this type of infection. ? Your sexual activity has changed since you were last screened and you are at an increased risk for chlamydia or gonorrhea. Ask your health care provider if you are at risk.  If you do not have HIV, but are at risk, it may be recommended that you  take a prescription medicine daily to prevent HIV infection. This is called pre-exposure prophylaxis (PrEP). You are considered at risk if: ? You are sexually active and do not regularly use condoms or know the HIV status of your partner(s). ? You take drugs by injection. ? You are sexually active with a partner who has HIV.  Talk with your health care provider about whether you are at high risk of being infected with HIV. If you choose to begin PrEP, you should first be tested for HIV. You should then be tested every 3 months for as long as you are taking PrEP. Pregnancy  If you are premenopausal and you may become pregnant, ask your health care provider about preconception counseling.  If you may become pregnant, take 400 to 800 micrograms (mcg) of folic acid every day.  If you want to prevent pregnancy, talk to your health care provider about birth control (contraception). Osteoporosis and menopause  Osteoporosis is a disease in which the bones lose minerals and strength with aging. This can result in serious bone fractures. Your risk for osteoporosis can be identified using a bone density scan.  If you are 56 years of age or older, or if you are at risk for osteoporosis and fractures, ask your health care provider if you should be screened.  Ask your health care provider whether you should take a calcium or vitamin D supplement to lower your risk for osteoporosis.  Menopause may have certain physical  symptoms and risks.  Hormone replacement therapy may reduce some of these symptoms and risks. Talk to your health care provider about whether hormone replacement therapy is right for you. Follow these instructions at home:  Schedule regular health, dental, and eye exams.  Stay current with your immunizations.  Do not use any tobacco products including cigarettes, chewing tobacco, or electronic cigarettes.  If you are pregnant, do not drink alcohol.  If you are breastfeeding, limit how much and how often you drink alcohol.  Limit alcohol intake to no more than 1 drink per day for nonpregnant women. One drink equals 12 ounces of beer, 5 ounces of wine, or 1 ounces of hard liquor.  Do not use street drugs.  Do not share needles.  Ask your health care provider for help if you need support or information about quitting drugs.  Tell your health care provider if you often feel depressed.  Tell your health care provider if you have ever been abused or do not feel safe at home. This information is not intended to replace advice given to you by your health care provider. Make sure you discuss any questions you have with your health care provider. Document Released: 11/14/2010 Document Revised: 10/07/2015 Document Reviewed: 02/02/2015 Elsevier Interactive Patient Education  [employer].

## 2017-06-27 NOTE — Progress Notes (Signed)
Subjective:    Patient ID: Marie Adams, female    DOB: February 23, 1971, 47 y.o.   MRN: [id number]  HPI She is here for a physical exam.   GERD:  She has been trying to control it with her diet, but it is not working.  She avoids caffeine and fried foods.  Carbonation will cause GERD.  She avoids these foods generally.  She has GERD about once every three weeks.   She has occasinally "GB attacks".  She had one recently.  It was after eating ice cream.  She would like to avoid surgery. Her last attack was over one year ago.  She generally avoid high fat foods.       Medications and allergies reviewed with patient and updated if appropriate.  Patient Active Problem List   Diagnosis Date Noted  . Anxiety 03/02/2009    Current Outpatient Medications on File Prior to Visit  Medication Sig Dispense Refill  . PARoxetine (PAXIL) 10 MG tablet Take 2 tablets (20 mg total) by mouth daily. 30 tablet 0   No current facility-administered medications on file prior to visit.     Past Medical History:  Diagnosis Date  . ANXIETY     Past Surgical History:  Procedure Laterality Date  . BREAST SURGERY  1990   Breast biopsy - benign    Social History   Socioeconomic History  . Marital status: Single    Spouse name: None  . Number of children: None  . Years of education: None  . Highest education level: None  Social Needs  . Financial resource strain: None  . Food insecurity - worry: None  . Food insecurity - inability: None  . Transportation needs - medical: None  . Transportation needs - non-medical: None  Occupational History  . None  Tobacco Use  . Smoking status: Never Smoker  . Smokeless tobacco: Never Used  Substance and Sexual Activity  . Alcohol use: Yes    Alcohol/week: 0.0 oz    Comment: 1 glass of wine nightly  . Drug use: No  . Sexual activity: None  Other Topics Concern  . None  Social History Narrative   Lives with wife [name]) 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     Exercise: treadmill    Family History  Problem Relation Age of Onset  . Arthritis Maternal Grandmother   . Diabetes Father        borderline  . Hyperlipidemia Father   . Hypertension Father   . Transient ischemic attack Paternal Grandfather   . Hypertension Mother     Review of Systems  Constitutional: Negative for appetite change, chills, fatigue, fever and unexpected weight change.  Eyes: Negative for visual disturbance.  Respiratory: Negative for cough, shortness of breath and wheezing.   Cardiovascular: Negative for chest pain, palpitations and leg swelling.  Gastrointestinal: Positive for abdominal pain (RUQ episodic - rare - possible GB). Negative for blood in stool, constipation, diarrhea and nausea.  Genitourinary: Negative for dysuria and hematuria.  Musculoskeletal: Negative for arthralgias, back pain and myalgias.  Skin: Negative for color change and rash.  Neurological: Negative for dizziness, light-headedness and headaches.  Psychiatric/Behavioral: Negative for dysphoric mood. The patient is nervous/anxious.        Objective:   Vitals:   06/28/17 0910  BP: (!) 142/94  Pulse: 84  Resp: 16  Temp: 97.8 F (36.6 C)  SpO2: 98%   Filed Weights   06/28/17 0910  Weight: 162 lb (73.5 kg)  Body mass index is 26.96 kg/m.  Wt Readings from Last 3 Encounters:  06/28/17 162 lb (73.5 kg)  01/28/16 153 lb (69.4 kg)  09/02/14 145 lb 2 oz (65.8 kg)     Physical Exam Constitutional: She appears well-developed and well-nourished. No distress.  HENT:  Head: Normocephalic and atraumatic.  Right Ear: External ear normal. Normal ear canal and TM Left Ear: External ear normal.  Normal ear canal and TM Mouth/Throat: Oropharynx is clear and moist.  Eyes: Conjunctivae and EOM are normal.  Neck: Neck supple. No tracheal deviation present. No thyromegaly present.  No carotid bruit  Cardiovascular: Normal rate, regular rhythm and normal heart sounds.   No murmur  heard.  No edema. Pulmonary/Chest: Effort normal and breath sounds normal. No respiratory distress. She has no wheezes. She has no rales.  Breast: deferred to Gyn Abdominal: Soft. She exhibits no distension. There is minimal tenderness w/o rebound or guarding in epigastric region and lower abdomen.  Lymphadenopathy: She has no cervical adenopathy.  Skin: Skin is warm and dry. She is not diaphoretic.  Psychiatric: She has a normal mood and affect. Her behavior is normal.        Assessment & Plan:   Physical exam: Screening blood work  ordered Immunizations  Up to date  Mammogram   Up to date  Gyn  Up to date  Exercise  regular Weight  BMI just above normal, advised weight loss Skin   No concerns - will consider seeing a derm for skin eval Substance abuse   none  See Problem List for Assessment and Plan of chronic medical problems.   FU in one year

## 2017-06-28 ENCOUNTER — Other Ambulatory Visit (INDEPENDENT_AMBULATORY_CARE_PROVIDER_SITE_OTHER): Payer: 59

## 2017-06-28 ENCOUNTER — Other Ambulatory Visit: Payer: Self-pay | Admitting: Internal Medicine

## 2017-06-28 ENCOUNTER — Ambulatory Visit (INDEPENDENT_AMBULATORY_CARE_PROVIDER_SITE_OTHER): Payer: 59 | Admitting: Internal Medicine

## 2017-06-28 ENCOUNTER — Encounter: Payer: Self-pay | Admitting: Internal Medicine

## 2017-06-28 VITALS — BP 142/94 | HR 84 | Temp 97.8°F | Resp 16 | Ht 65.0 in | Wt 162.0 lb

## 2017-06-28 DIAGNOSIS — F419 Anxiety disorder, unspecified: Secondary | ICD-10-CM

## 2017-06-28 DIAGNOSIS — D649 Anemia, unspecified: Secondary | ICD-10-CM

## 2017-06-28 DIAGNOSIS — R1011 Right upper quadrant pain: Secondary | ICD-10-CM | POA: Insufficient documentation

## 2017-06-28 DIAGNOSIS — Z0001 Encounter for general adult medical examination with abnormal findings: Secondary | ICD-10-CM

## 2017-06-28 DIAGNOSIS — K219 Gastro-esophageal reflux disease without esophagitis: Secondary | ICD-10-CM

## 2017-06-28 DIAGNOSIS — Z0000 Encounter for general adult medical examination without abnormal findings: Secondary | ICD-10-CM

## 2017-06-28 LAB — COMPREHENSIVE METABOLIC PANEL
ALT: 14 U/L (ref 0–35)
AST: 18 U/L (ref 0–37)
Albumin: 4.4 g/dL (ref 3.5–5.2)
Alkaline Phosphatase: 39 U/L (ref 39–117)
BUN: 14 mg/dL (ref 6–23)
CHLORIDE: 102 meq/L (ref 96–112)
CO2: 29 mEq/L (ref 19–32)
Calcium: 9.3 mg/dL (ref 8.4–10.5)
Creatinine, Ser: 0.7 mg/dL (ref 0.40–1.20)
GFR: 95.13 mL/min (ref >60.00)
GLUCOSE: 83 mg/dL (ref 70–99)
POTASSIUM: 4.5 meq/L (ref 3.5–5.1)
SODIUM: 138 meq/L (ref 135–145)
Total Bilirubin: 0.5 mg/dL (ref 0.2–1.2)
Total Protein: 7.7 g/dL (ref 6.0–8.3)

## 2017-06-28 LAB — CBC WITH DIFFERENTIAL/PLATELET
BASOS PCT: 0.4 % (ref 0.0–3.0)
Basophils Absolute: 0 10*3/uL (ref 0.0–0.1)
EOS PCT: 3 % (ref 0.0–5.0)
Eosinophils Absolute: 0.3 10*3/uL (ref 0.0–0.7)
HCT: 37.1 % (ref 36.0–46.0)
Hemoglobin: 11.6 g/dL — ABNORMAL LOW (ref 12.0–15.0)
LYMPHS ABS: 2 10*3/uL (ref 0.7–4.0)
Lymphocytes Relative: 22.5 % (ref 12.0–46.0)
MCHC: 31.3 g/dL (ref 30.0–36.0)
MCV: 73.6 fl — AB (ref 78.0–100.0)
MONO ABS: 0.9 10*3/uL (ref 0.1–1.0)
MONOS PCT: 10.1 % (ref 3.0–12.0)
NEUTROS PCT: 64 % (ref 43.0–77.0)
Neutro Abs: 5.6 10*3/uL (ref 1.4–7.7)
Platelets: 339 10*3/uL (ref 150.0–400.0)
RBC: 5.04 Mil/uL (ref 3.87–5.11)
RDW: 17.7 % — AB (ref 11.5–15.5)
WBC: 8.8 10*3/uL (ref 4.0–10.5)

## 2017-06-28 LAB — LIPID PANEL
CHOL/HDL RATIO: 3
Cholesterol: 234 mg/dL — ABNORMAL HIGH (ref 0–200)
HDL: 75.6 mg/dL (ref >39.00)
LDL CALC: 134 mg/dL — AB (ref 0–99)
NONHDL: 158.58
Triglycerides: 123 mg/dL (ref 0.0–149.0)
VLDL: 24.6 mg/dL (ref 0.0–40.0)

## 2017-06-28 LAB — FERRITIN: Ferritin: 3.2 ng/mL — ABNORMAL LOW (ref 10.0–291.0)

## 2017-06-28 LAB — IRON: Iron: 33 ug/dL — ABNORMAL LOW (ref 42–145)

## 2017-06-28 LAB — TSH: TSH: 2.41 u[IU]/mL (ref 0.35–4.50)

## 2017-06-28 MED ORDER — PAROXETINE HCL 10 MG PO TABS: 20.0000 mg | ORAL_TABLET | Freq: Every day | ORAL | 1 refills | Status: DC

## 2017-06-28 MED ORDER — RANITIDINE HCL 150 MG PO TABS: 150.0000 mg | ORAL_TABLET | Freq: Every day | ORAL | 11 refills | Status: DC

## 2017-06-28 NOTE — Assessment & Plan Note (Signed)
Once every three weeks on average Compliant with GERD diet most of the time Symptoms usually related to occasionally carbonated beverage or caffeine Zantac prn or daily if needed (when going on vacation and not compliant with a GERD diet)

## 2017-06-28 NOTE — Assessment & Plan Note (Addendum)
Episodic, infrequent She is mildly tender on exam which she feels is more gas related since tenderness is in epigastric region and lower abdomen Associated with fatty/rich foods Likely GB attacks She wants to avoid surgery and will just try avoiding high fat foods Will check abdominal US

## 2017-06-28 NOTE — Assessment & Plan Note (Signed)
Check cbc, iron, ferritin 

## 2017-06-28 NOTE — Assessment & Plan Note (Signed)
Controlled, stable Continue current dose of medication  

## 2017-07-10 ENCOUNTER — Ambulatory Visit
Admission: RE | Admit: 2017-07-10 | Discharge: 2017-07-10 | Disposition: A | Discharge status: Home / Self Care | Payer: 59 | Source: Ambulatory Visit | Attending: Internal Medicine | Admitting: Internal Medicine

## 2017-07-10 DIAGNOSIS — R1011 Right upper quadrant pain: Secondary | ICD-10-CM | POA: Diagnosis not present

## 2017-07-12 ENCOUNTER — Other Ambulatory Visit: Payer: Self-pay | Admitting: Internal Medicine

## 2017-07-12 ENCOUNTER — Encounter: Payer: Self-pay | Admitting: Internal Medicine

## 2017-07-12 DIAGNOSIS — K7689 Other specified diseases of liver: Secondary | ICD-10-CM

## 2017-07-12 DIAGNOSIS — R93429 Abnormal radiologic findings on diagnostic imaging of unspecified kidney: Secondary | ICD-10-CM | POA: Insufficient documentation

## 2017-08-25 IMAGING — US US ABDOMEN COMPLETE
1 series · 14 of 25 positions shown · non-contrast
Comparison: Ultrasound [DATE].

CLINICAL DATA: Hepatic cyst.

EXAM:
ABDOMEN ULTRASOUND COMPLETE

[Series 1: us abdomen complete · 0.19mm/px · 14 of 93 slices shown]
[im 1/93]
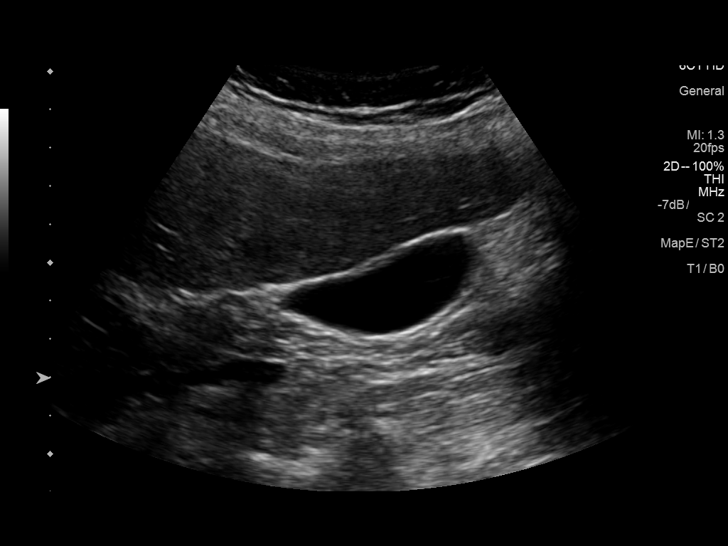
[im 8/93]
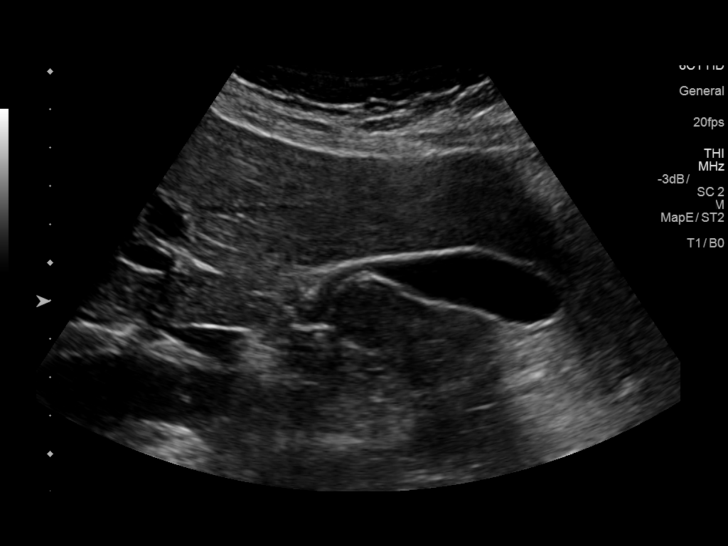
[im 16/93]
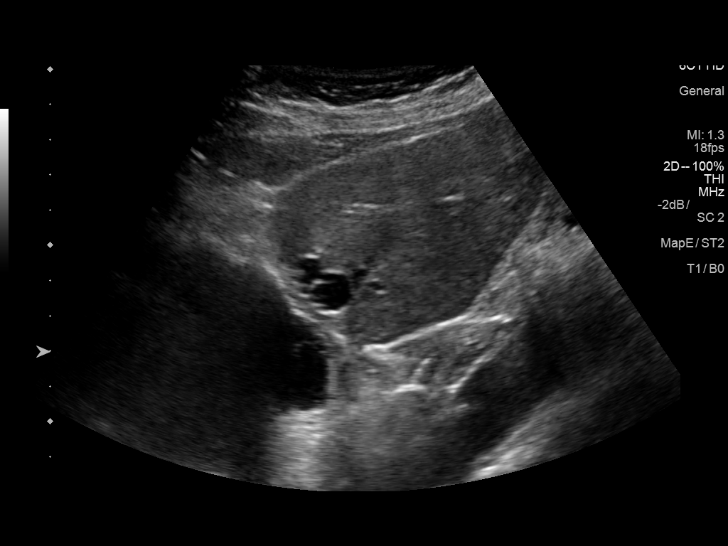
[im 24/93]
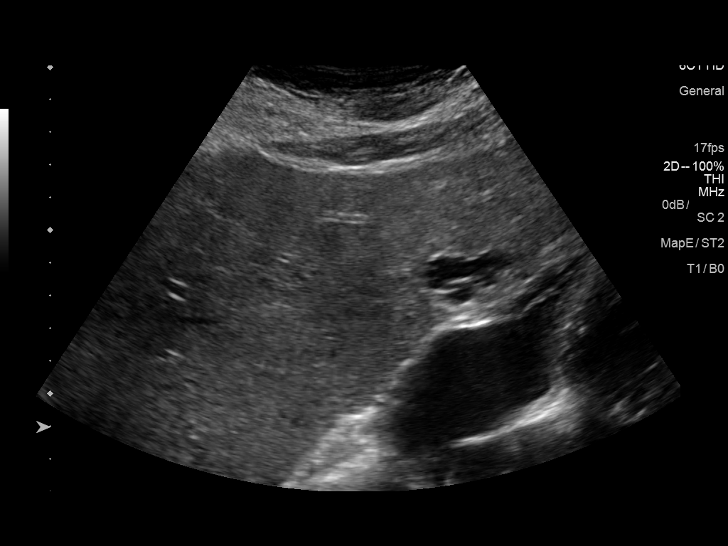
[im 31/93]
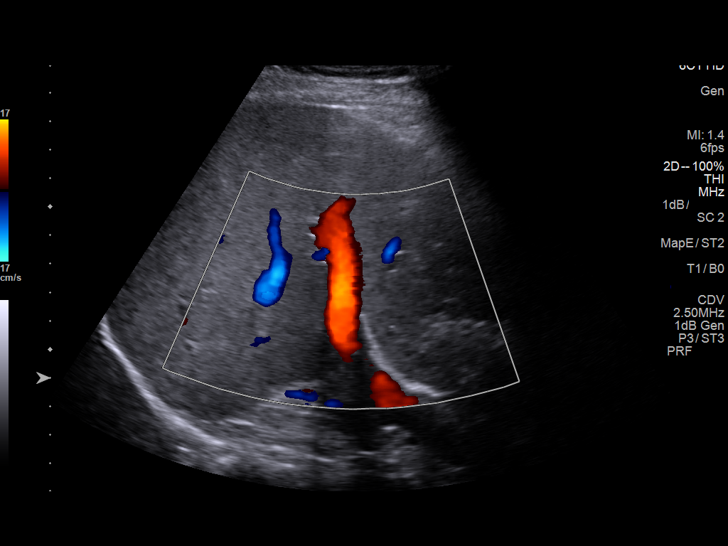
[im 35/93]
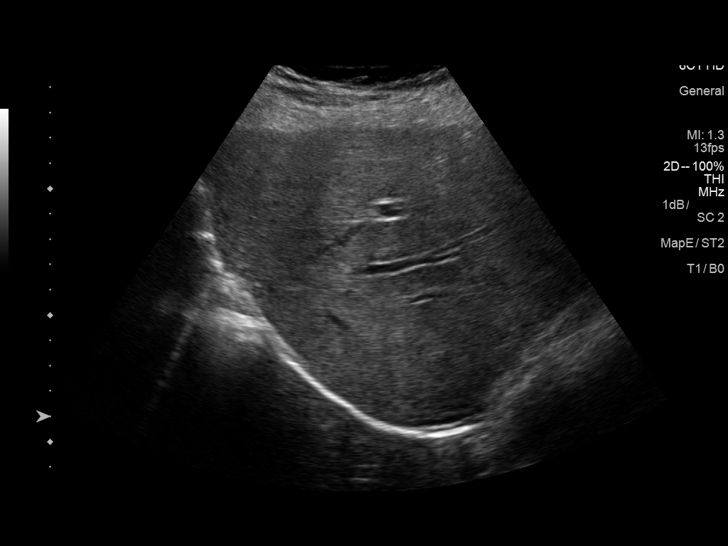
[im 43/93]
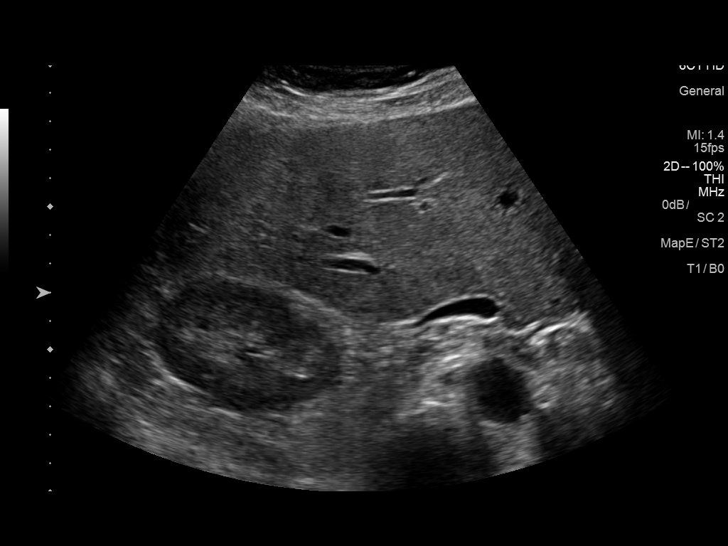
[im 50/93]
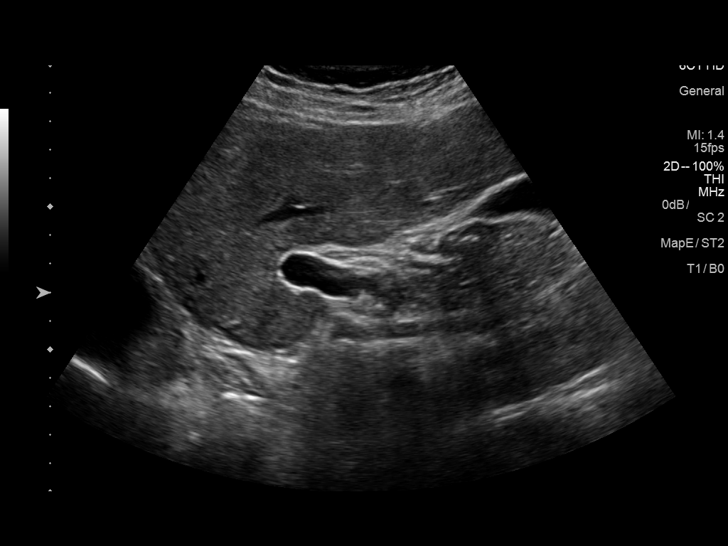
[im 58/93]
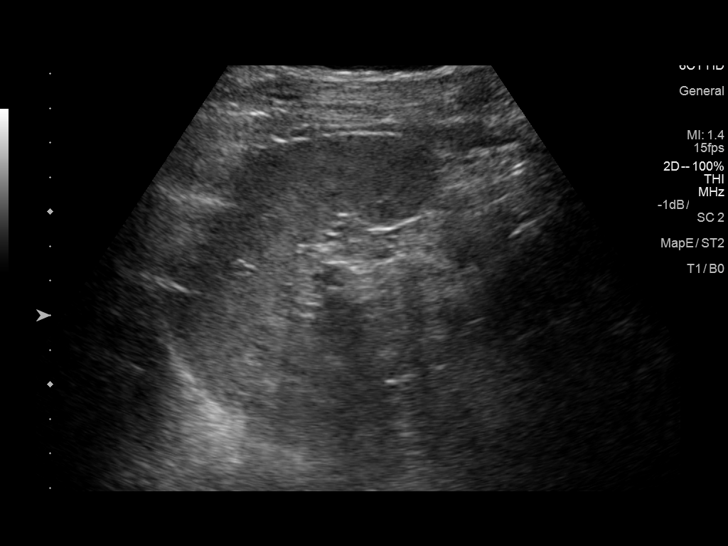
[im 62/93]
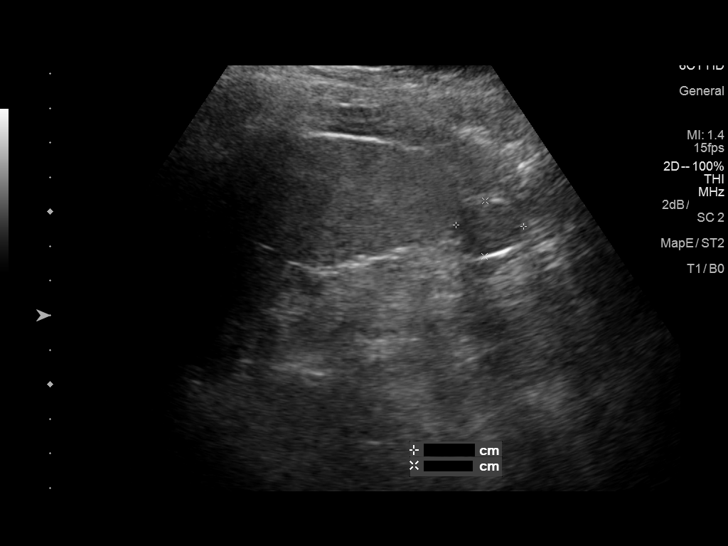
[im 70/93]
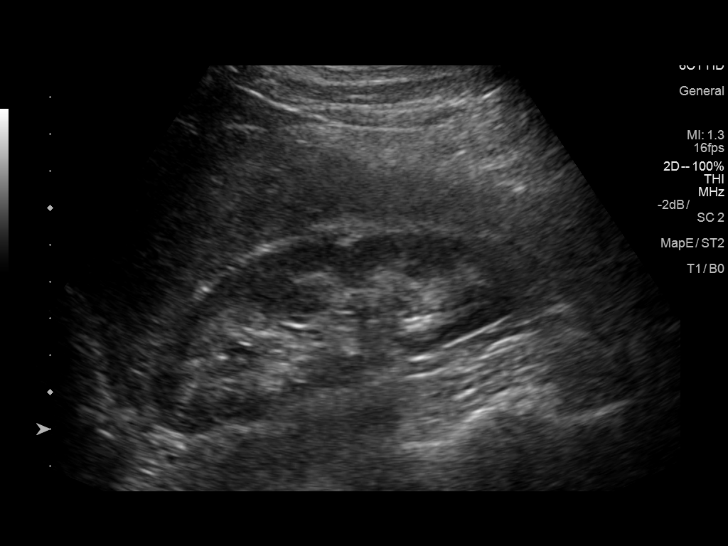
[im 77/93]
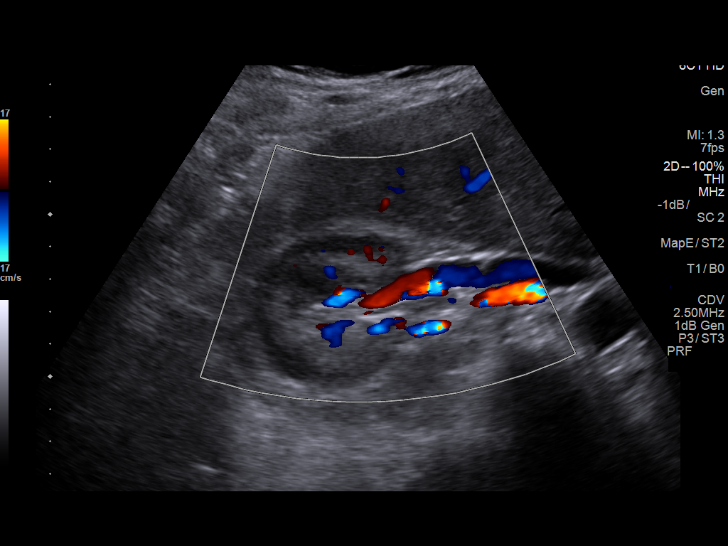
[im 85/93]
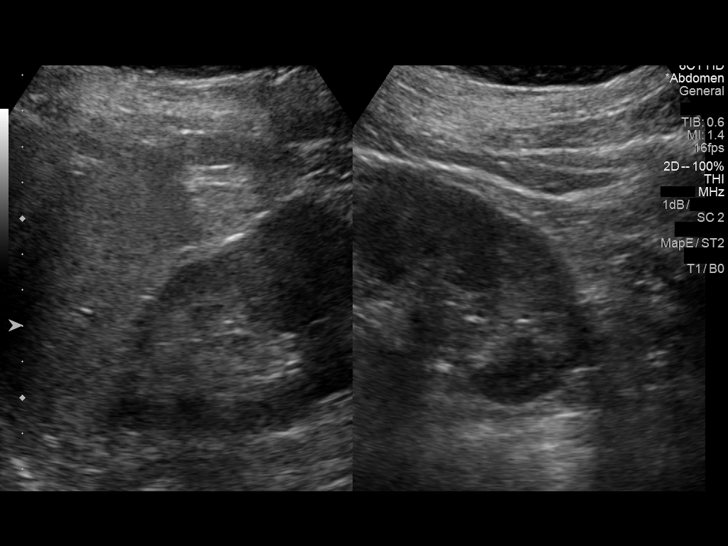
[im 93/93]
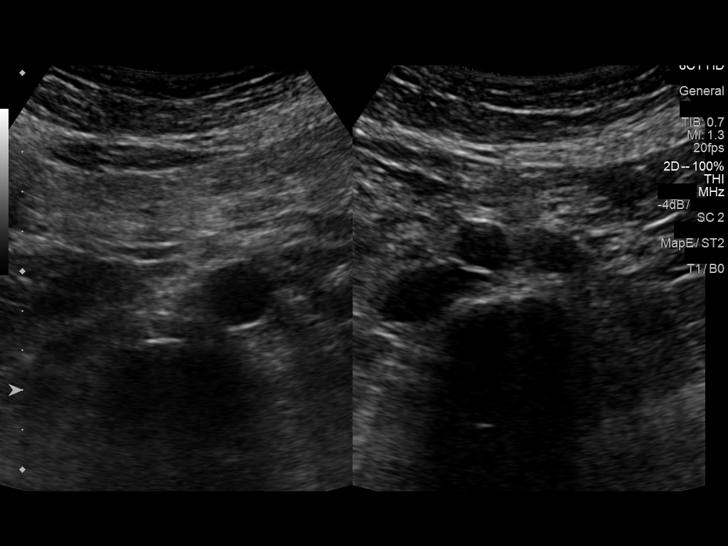

[14 of 25 positions shown; findings below may reference images not displayed]

FINDINGS: Gallbladder: No gallstones or wall thickening visualized. No
sonographic Murphy sign noted by sonographer.

Common bile duct: Diameter: 2.2 mm which is within normal limits.

Liver: Stable multi-septated cystic lesion seen in left hepatic lobe
with maximum diameter of 2.8 cm. Within normal limits in parenchymal
echogenicity. Portal vein is patent on color Doppler imaging with
normal direction of blood flow towards the liver.

IVC: No abnormality visualized.

Pancreas: Visualized portion unremarkable.

Spleen: Size and appearance within normal limits.

Right Kidney: Length: 10.9 cm. Echogenicity within normal limits. No
mass or hydronephrosis visualized.

Left Kidney: Length: 11.3 cm. Echogenicity within normal limits. No
mass or hydronephrosis visualized.

Abdominal aorta: No aneurysm visualized.

Other findings: None.
IMPRESSION: Stable multi-septated cystic lesion seen in left hepatic lobe. This
most likely represents benign process, but follow-up ultrasound in 1
year is recommended to ensure stability.

No other abnormality seen in the abdomen.

## 2017-08-29 DIAGNOSIS — Z1231 Encounter for screening mammogram for malignant neoplasm of breast: Secondary | ICD-10-CM | POA: Diagnosis not present

## 2017-08-30 ENCOUNTER — Other Ambulatory Visit: Payer: Self-pay | Admitting: Obstetrics and Gynecology

## 2017-08-30 DIAGNOSIS — R928 Other abnormal and inconclusive findings on diagnostic imaging of breast: Secondary | ICD-10-CM

## 2017-08-31 ENCOUNTER — Ambulatory Visit
Admission: RE | Admit: 2017-08-31 | Discharge: 2017-08-31 | Disposition: A | Discharge status: Home / Self Care | Payer: 59 | Source: Ambulatory Visit | Attending: Obstetrics and Gynecology | Admitting: Obstetrics and Gynecology

## 2017-08-31 ENCOUNTER — Other Ambulatory Visit: Payer: Self-pay | Admitting: Obstetrics and Gynecology

## 2017-08-31 DIAGNOSIS — R922 Inconclusive mammogram: Secondary | ICD-10-CM | POA: Diagnosis not present

## 2017-08-31 DIAGNOSIS — N6324 Unspecified lump in the left breast, lower inner quadrant: Secondary | ICD-10-CM | POA: Diagnosis not present

## 2017-08-31 DIAGNOSIS — N63 Unspecified lump in unspecified breast: Secondary | ICD-10-CM

## 2017-08-31 DIAGNOSIS — R928 Other abnormal and inconclusive findings on diagnostic imaging of breast: Secondary | ICD-10-CM

## 2017-08-31 IMAGING — MG DIGITAL DIAGNOSTIC UNILATERAL LEFT MAMMOGRAM WITH TOMO AND CAD
4 series · 4 of 12 positions shown · non-contrast
Comparison: Previous exam(s).

CLINICAL DATA: The patient was called back for left breast mass

EXAM:
DIGITAL DIAGNOSTIC LEFT MAMMOGRAM WITH TOMO
ULTRASOUND LEFT BREAST

[L MLO synth-2D]
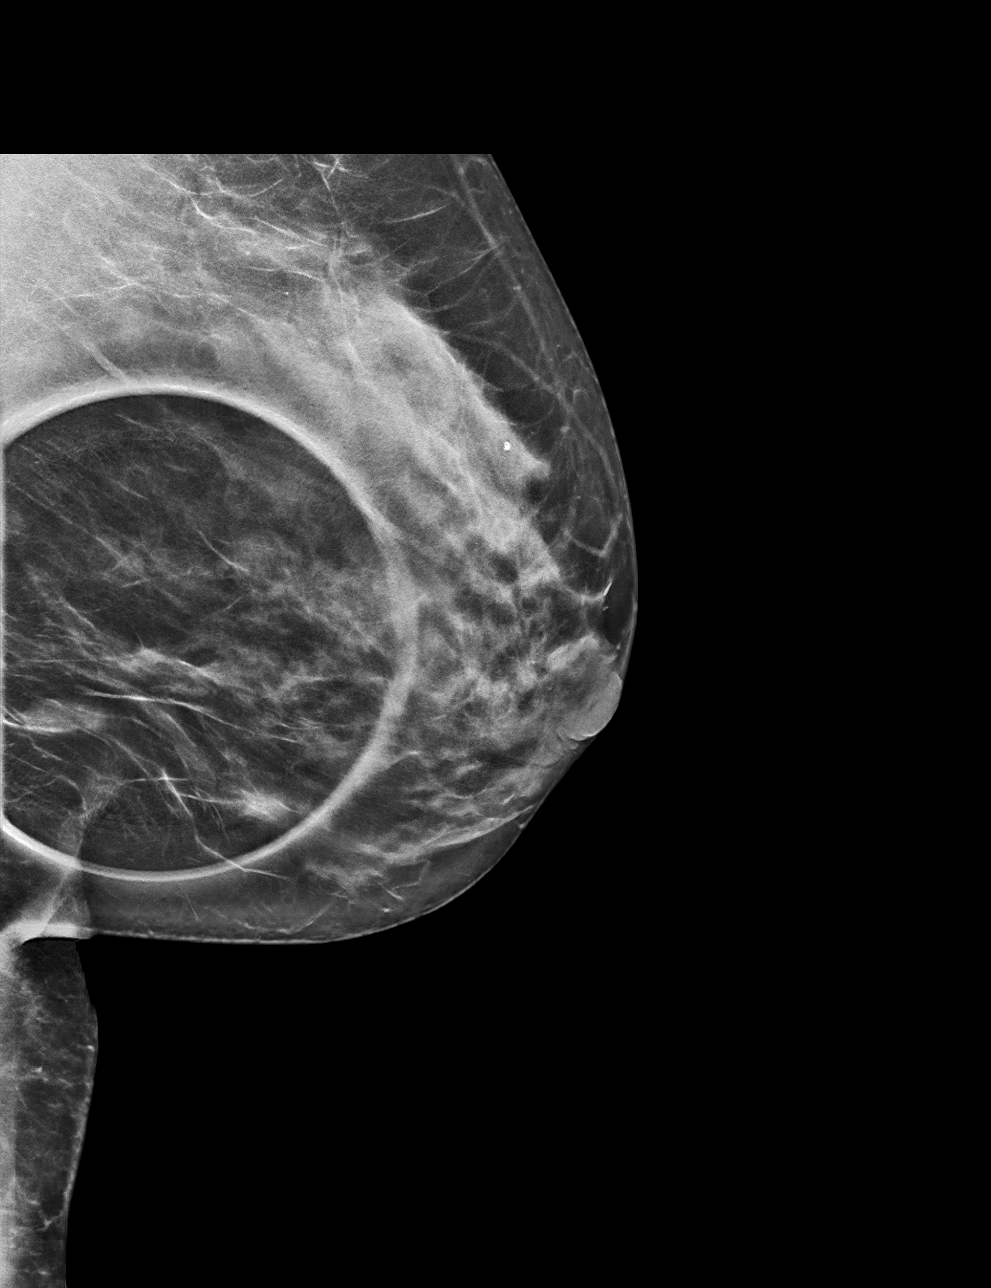

[L CC synth-2D]
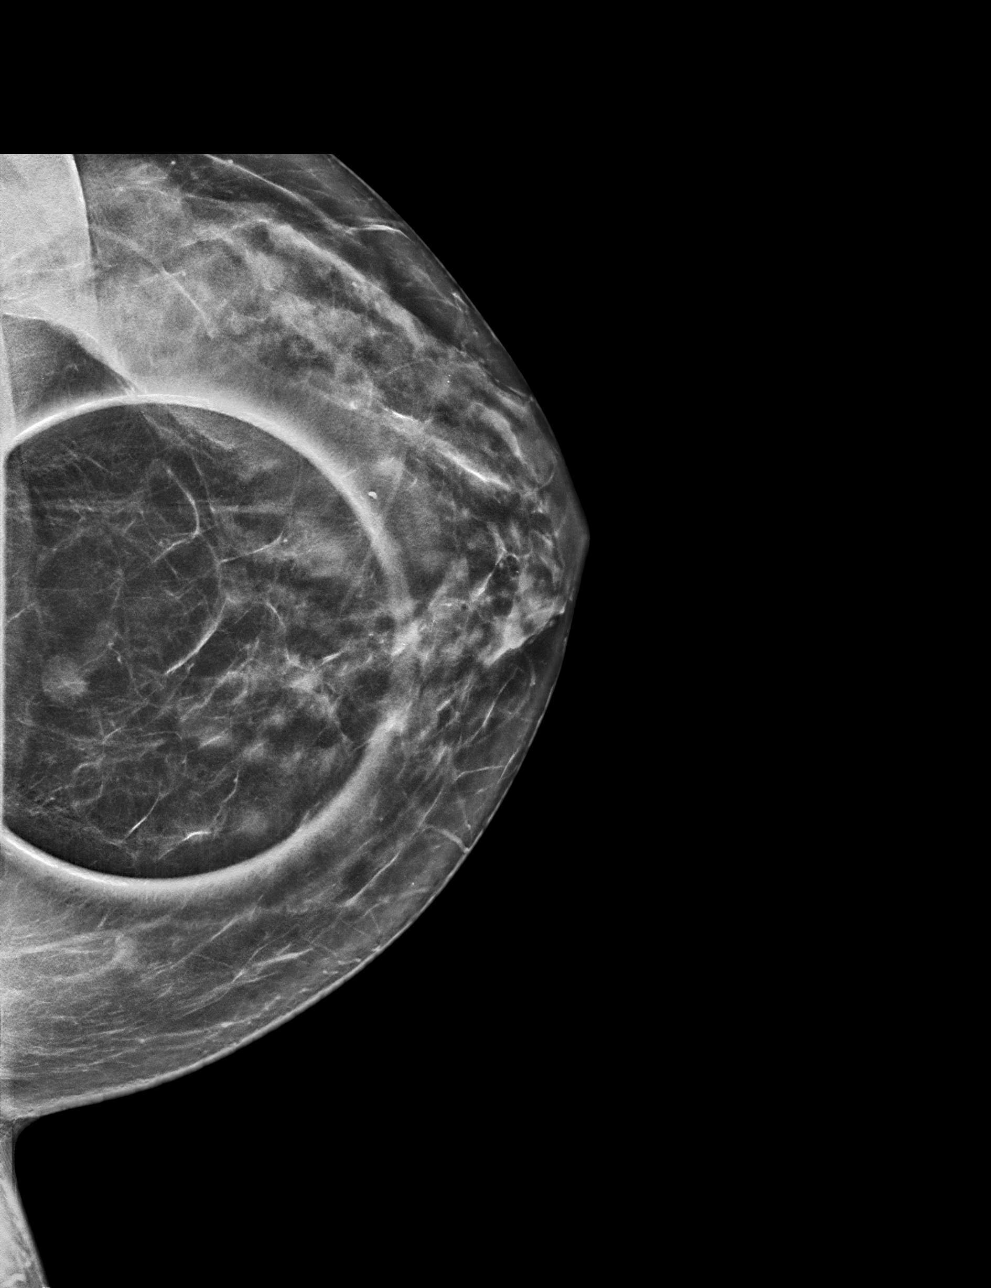

[L CC tomo · tomo slice 31/61.0]
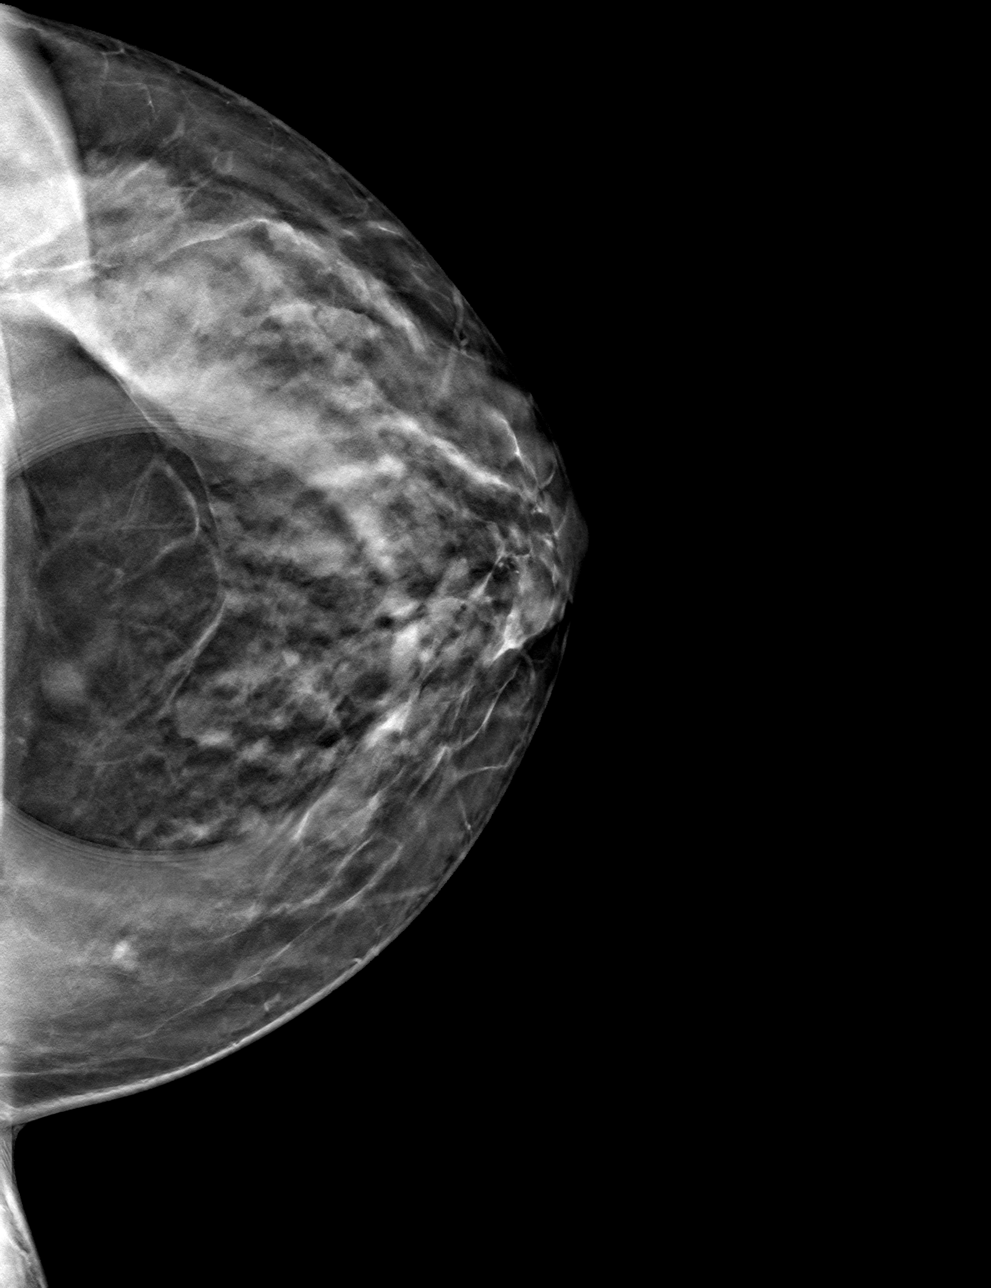

[L MLO tomo · tomo slice 32/63.0]
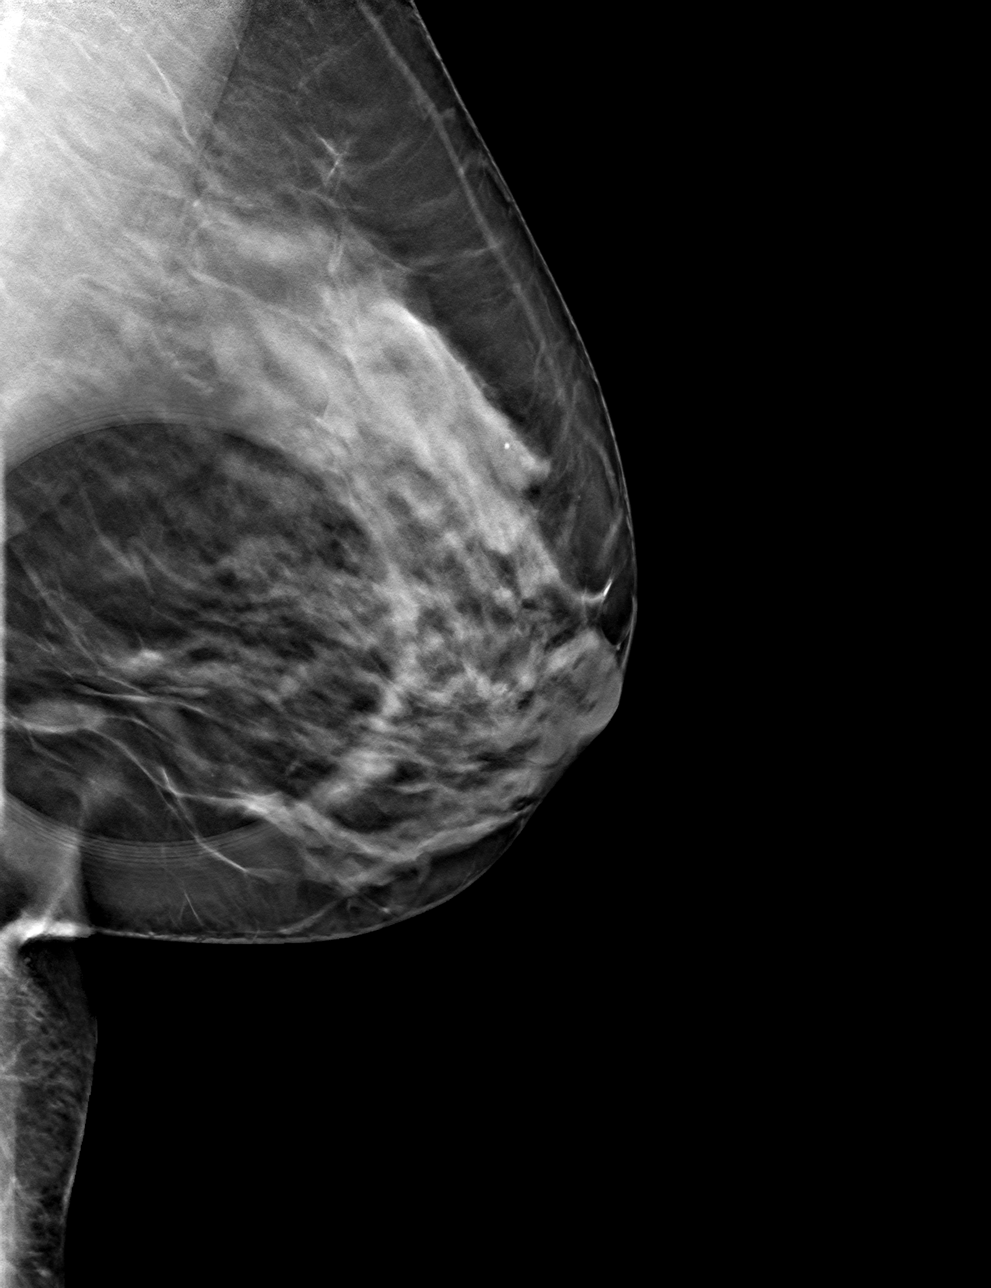

[4 of 12 positions shown; findings below may reference images not displayed]

ACR Breast Density Category c: The breast tissue is heterogeneously
dense, which may obscure small masses.
FINDINGS: The 2 adjacent masses in the medial inferior left breast are
probably benign in appearance.

On physical exam, no suspicious lumps are identified.

Targeted ultrasound is performed, showing 2 adjacent masses with the
larger seen at 7 o'clock, 5 cm from the nipple measuring 9 x 5 x 7
mm. The adjacent smaller mass measures 4 x 2 x 7 mm. Both masses are
probably fibroadenomas and probably benign.
IMPRESSION: Probably benign left breast masses.

RECOMMENDATION:
Six-month follow-up ultrasound of the probably benign left breast
masses. The patient may wish for 1 or both of the masses to be
biopsied instead. She will contact us if she needs biopsy

I have discussed the findings and recommendations with the patient.
Results were also provided in writing at the conclusion of the
visit. If applicable, a reminder letter will be sent to the patient
regarding the next appointment.

BI-RADS CATEGORY  3: Probably benign.

## 2017-08-31 IMAGING — US ULTRASOUND LEFT BREAST LIMITED
1 series · 12 of 12 positions shown · non-contrast
Comparison: Previous exam(s).

CLINICAL DATA: The patient was called back for left breast mass

EXAM:
DIGITAL DIAGNOSTIC LEFT MAMMOGRAM WITH TOMO
ULTRASOUND LEFT BREAST

[Series 1: ultrasound left breast limited · 0.05mm/px · 12 of 12 slices shown]
[im 1/12]
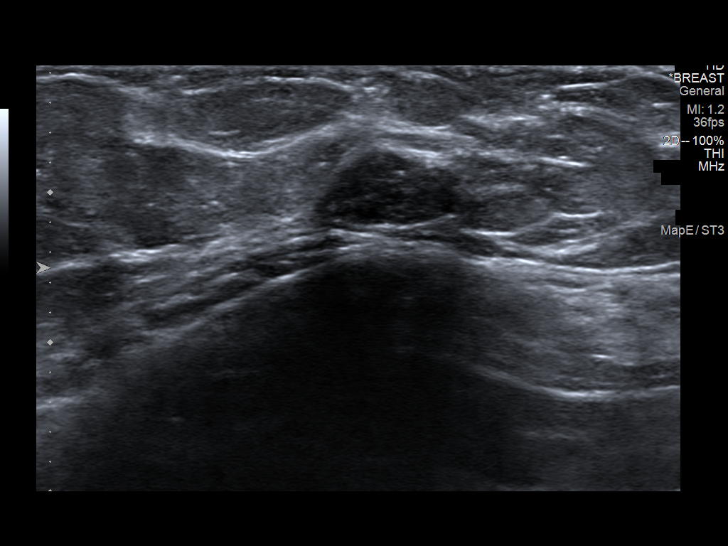
[im 2/12]
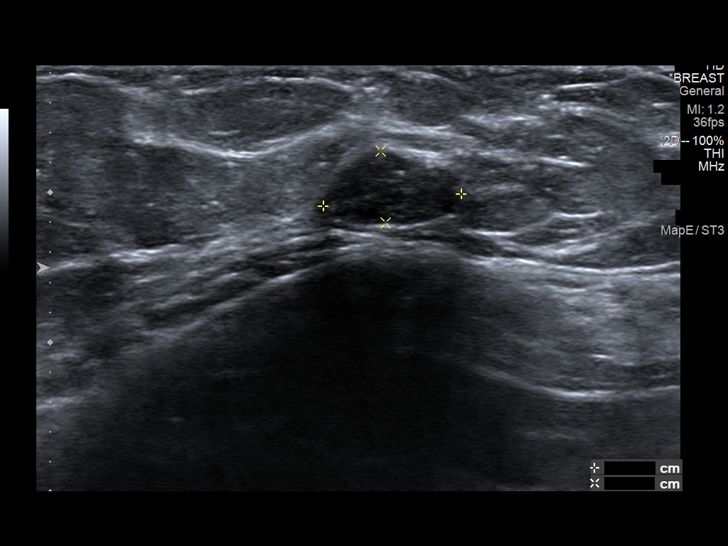
[im 3/12]
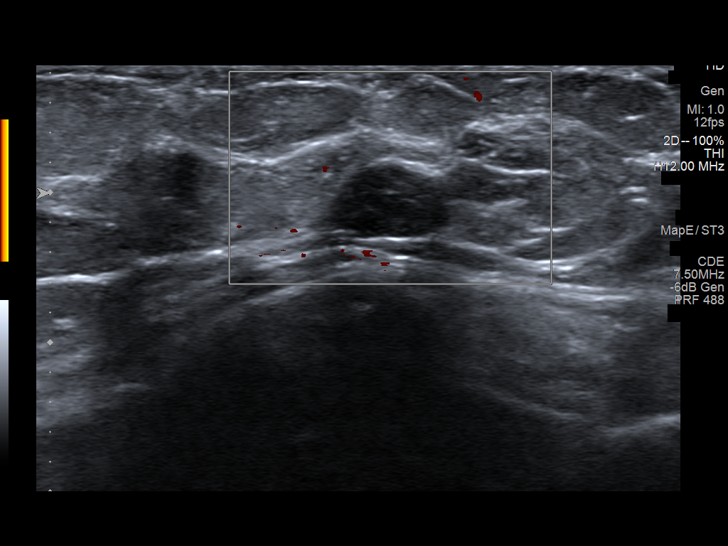
[im 4/12]
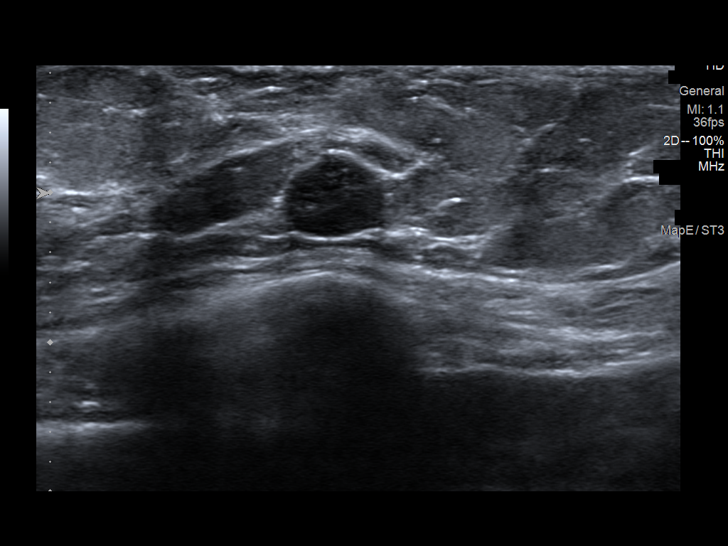
[im 5/12]
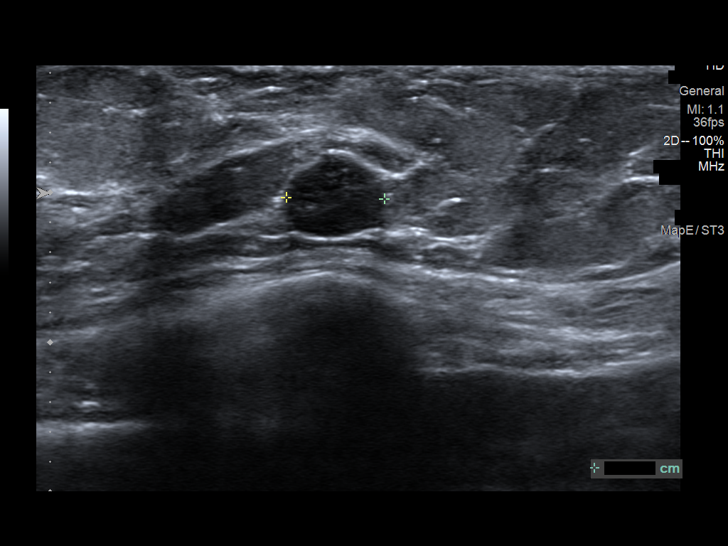
[im 6/12]
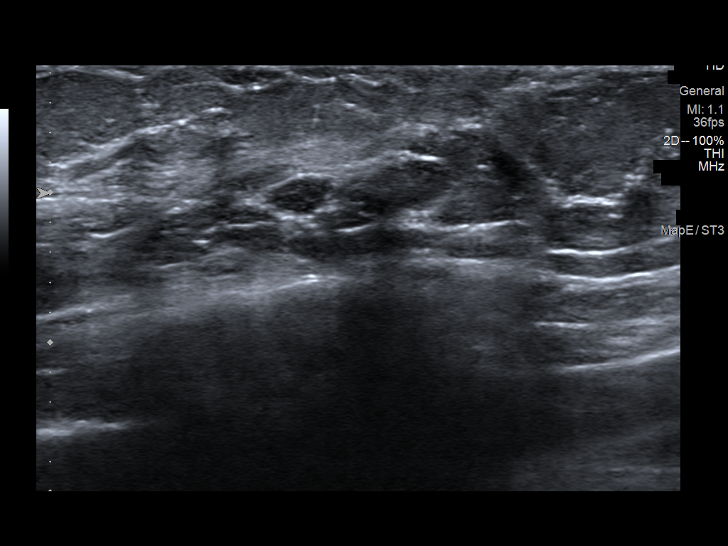
[im 7/12]
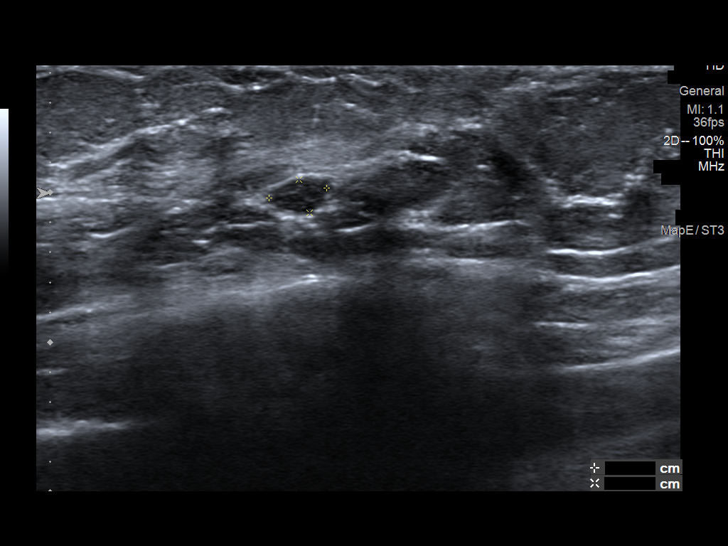
[im 8/12]
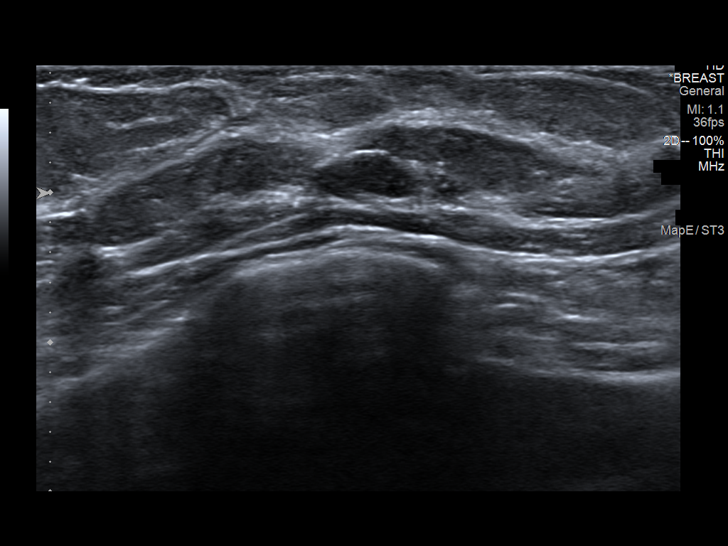
[im 9/12]
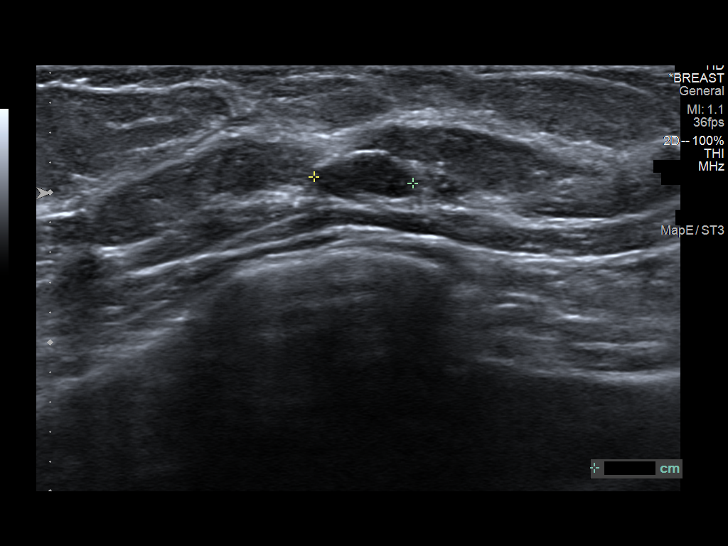
[im 10/12]
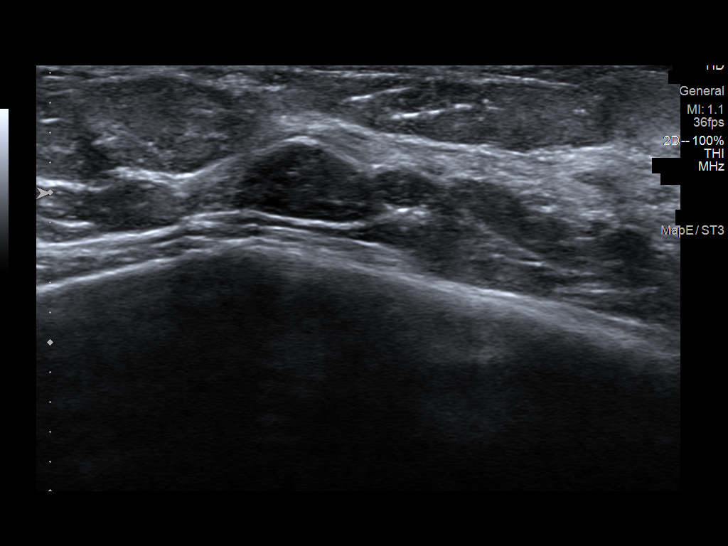
[im 11/12]
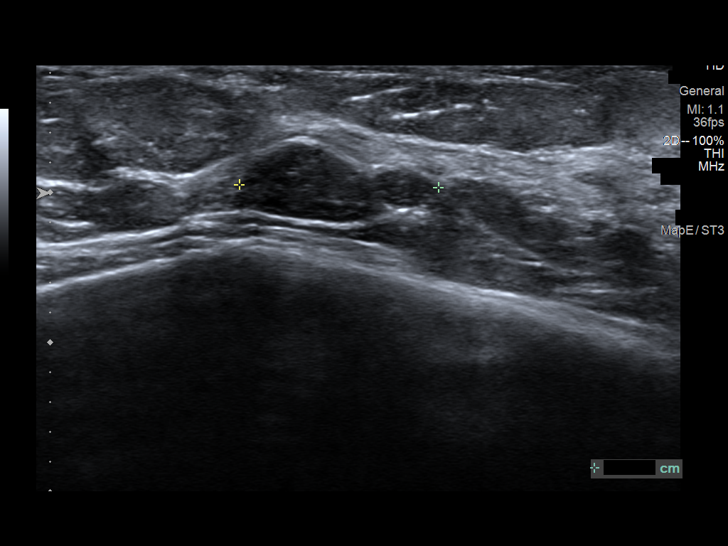
[im 12/12]
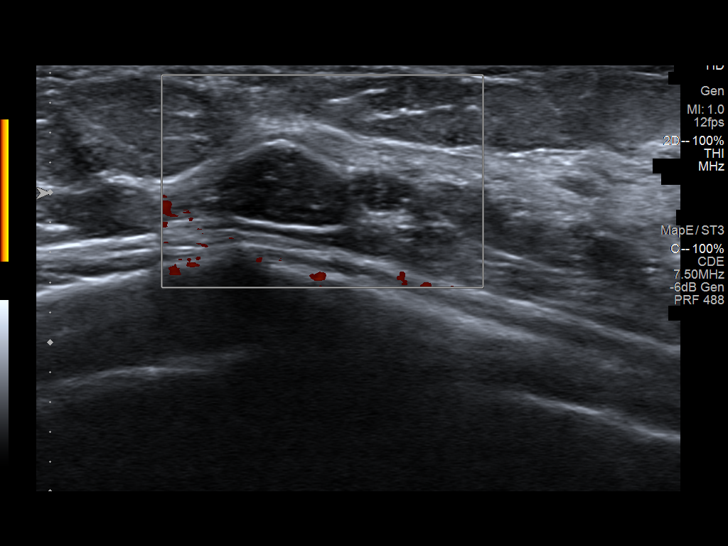

[12 of 12 positions shown; findings below may reference images not displayed]

ACR Breast Density Category c: The breast tissue is heterogeneously
dense, which may obscure small masses.
FINDINGS: The 2 adjacent masses in the medial inferior left breast are
probably benign in appearance.

On physical exam, no suspicious lumps are identified.

Targeted ultrasound is performed, showing 2 adjacent masses with the
larger seen at 7 o'clock, 5 cm from the nipple measuring 9 x 5 x 7
mm. The adjacent smaller mass measures 4 x 2 x 7 mm. Both masses are
probably fibroadenomas and probably benign.
IMPRESSION: Probably benign left breast masses.

RECOMMENDATION:
Six-month follow-up ultrasound of the probably benign left breast
masses. The patient may wish for 1 or both of the masses to be
biopsied instead. She will contact us if she needs biopsy

I have discussed the findings and recommendations with the patient.
Results were also provided in writing at the conclusion of the
visit. If applicable, a reminder letter will be sent to the patient
regarding the next appointment.

BI-RADS CATEGORY  3: Probably benign.

## 2017-11-12 DIAGNOSIS — Z01419 Encounter for gynecological examination (general) (routine) without abnormal findings: Secondary | ICD-10-CM | POA: Diagnosis not present

## 2017-11-12 DIAGNOSIS — Z6826 Body mass index (BMI) 26.0-26.9, adult: Secondary | ICD-10-CM | POA: Diagnosis not present

## 2018-01-04 ENCOUNTER — Other Ambulatory Visit: Payer: Self-pay | Admitting: Internal Medicine

## 2018-01-08 ENCOUNTER — Inpatient Hospital Stay: Admission: RE | Admit: 2018-01-08 | Payer: 59 | Source: Ambulatory Visit

## 2018-01-15 ENCOUNTER — Ambulatory Visit
Admission: RE | Admit: 2018-01-15 | Discharge: 2018-01-15 | Disposition: A | Discharge status: Home / Self Care | Payer: 59 | Source: Ambulatory Visit | Attending: Internal Medicine | Admitting: Internal Medicine

## 2018-01-15 DIAGNOSIS — K769 Liver disease, unspecified: Secondary | ICD-10-CM | POA: Diagnosis not present

## 2018-01-15 DIAGNOSIS — R93429 Abnormal radiologic findings on diagnostic imaging of unspecified kidney: Secondary | ICD-10-CM

## 2018-01-15 DIAGNOSIS — K7689 Other specified diseases of liver: Secondary | ICD-10-CM

## 2018-01-16 ENCOUNTER — Encounter: Payer: Self-pay | Admitting: Internal Medicine

## 2018-03-05 ENCOUNTER — Other Ambulatory Visit: Payer: Self-pay | Admitting: Obstetrics and Gynecology

## 2018-03-05 ENCOUNTER — Ambulatory Visit
Admission: RE | Admit: 2018-03-05 | Discharge: 2018-03-05 | Disposition: A | Discharge status: Home / Self Care | Payer: 59 | Source: Ambulatory Visit | Attending: Obstetrics and Gynecology | Admitting: Obstetrics and Gynecology

## 2018-03-05 DIAGNOSIS — N63 Unspecified lump in unspecified breast: Secondary | ICD-10-CM

## 2018-03-05 DIAGNOSIS — R922 Inconclusive mammogram: Secondary | ICD-10-CM | POA: Diagnosis not present

## 2018-03-05 DIAGNOSIS — N6323 Unspecified lump in the left breast, lower outer quadrant: Secondary | ICD-10-CM | POA: Diagnosis not present

## 2018-03-05 IMAGING — MG DIGITAL DIAGNOSTIC UNILATERAL LEFT MAMMOGRAM WITH TOMO AND CAD
4 series · 4 of 12 positions shown · non-contrast
Comparison: Previous exam(s).

CLINICAL DATA: Follow-up of 2 probably benign left breast 7 o'clock
masses. This is patient's first six-month follow-up.

EXAM:
DIGITAL DIAGNOSTIC LEFT MAMMOGRAM WITH CAD AND TOMO
ULTRASOUND LEFT BREAST

[L MLO synth-2D]
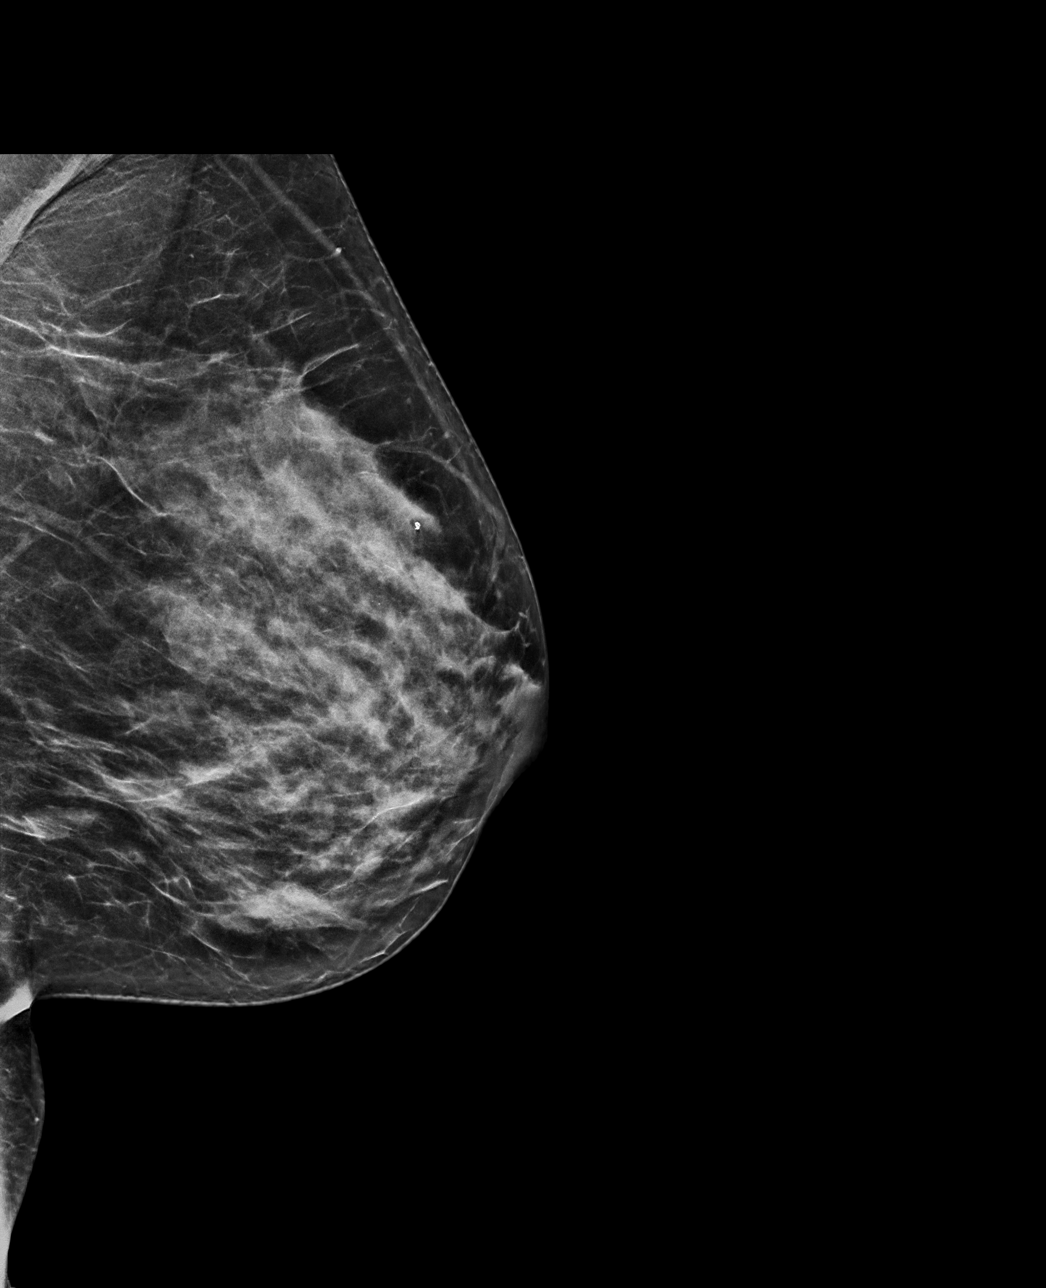

[L CC synth-2D]
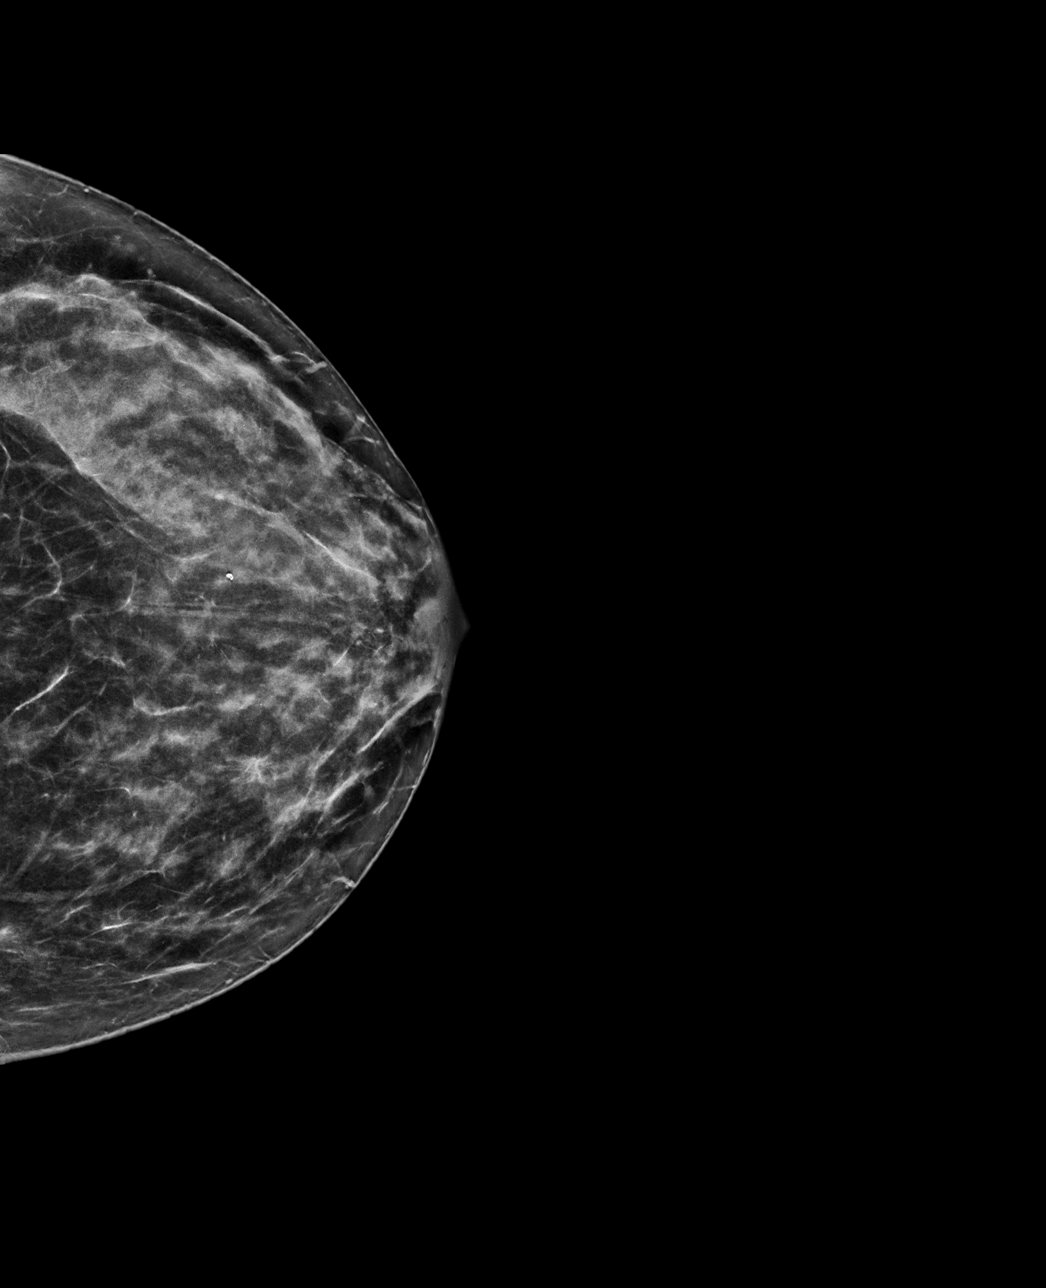

[L CC tomo · tomo slice 33/65.0]
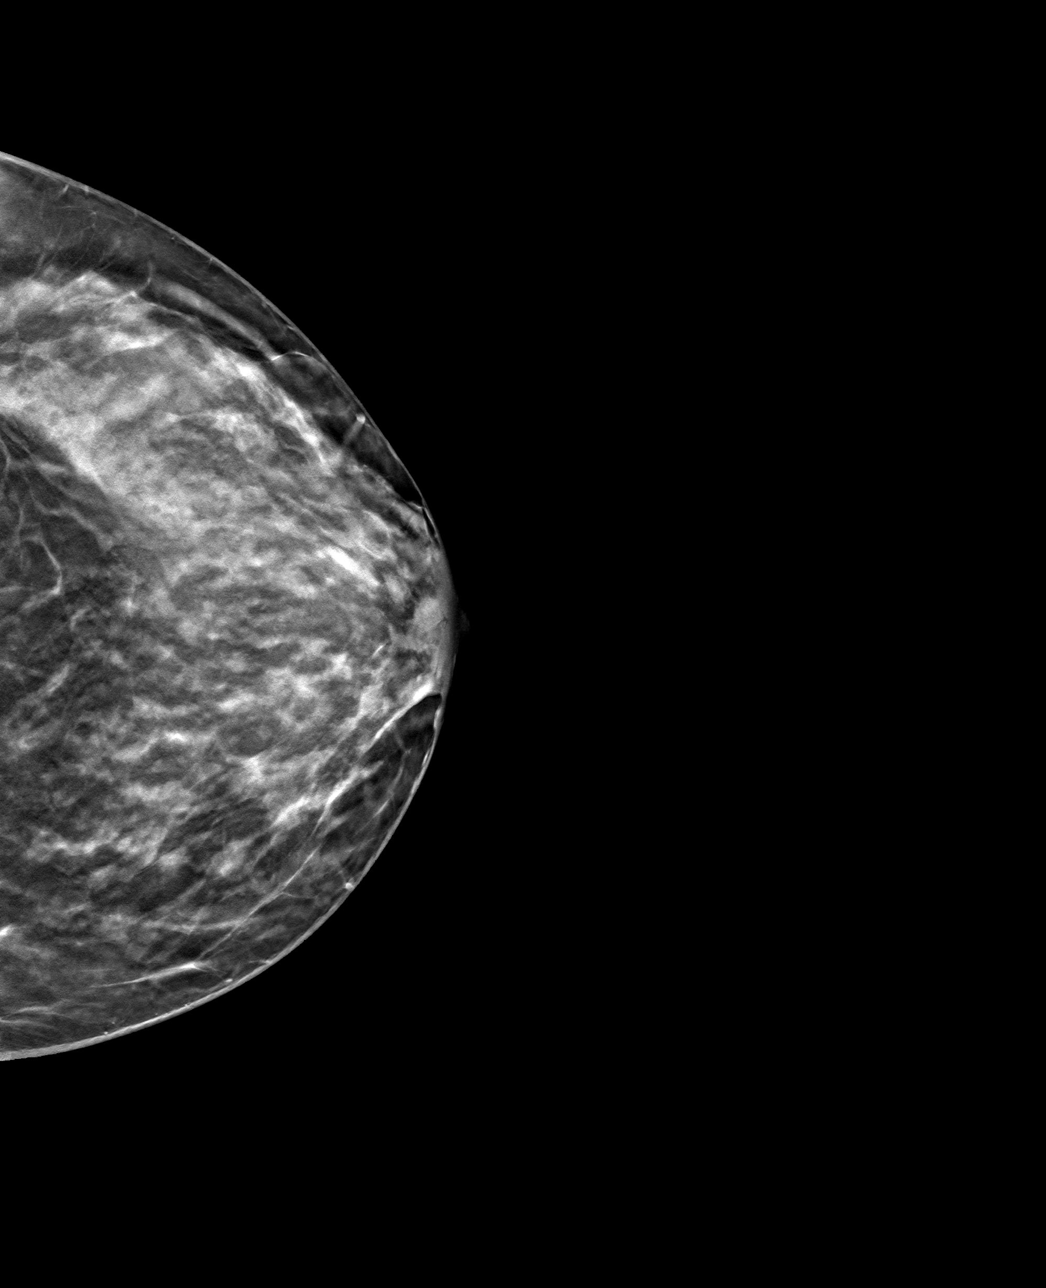

[L MLO tomo · tomo slice 35/69.0]
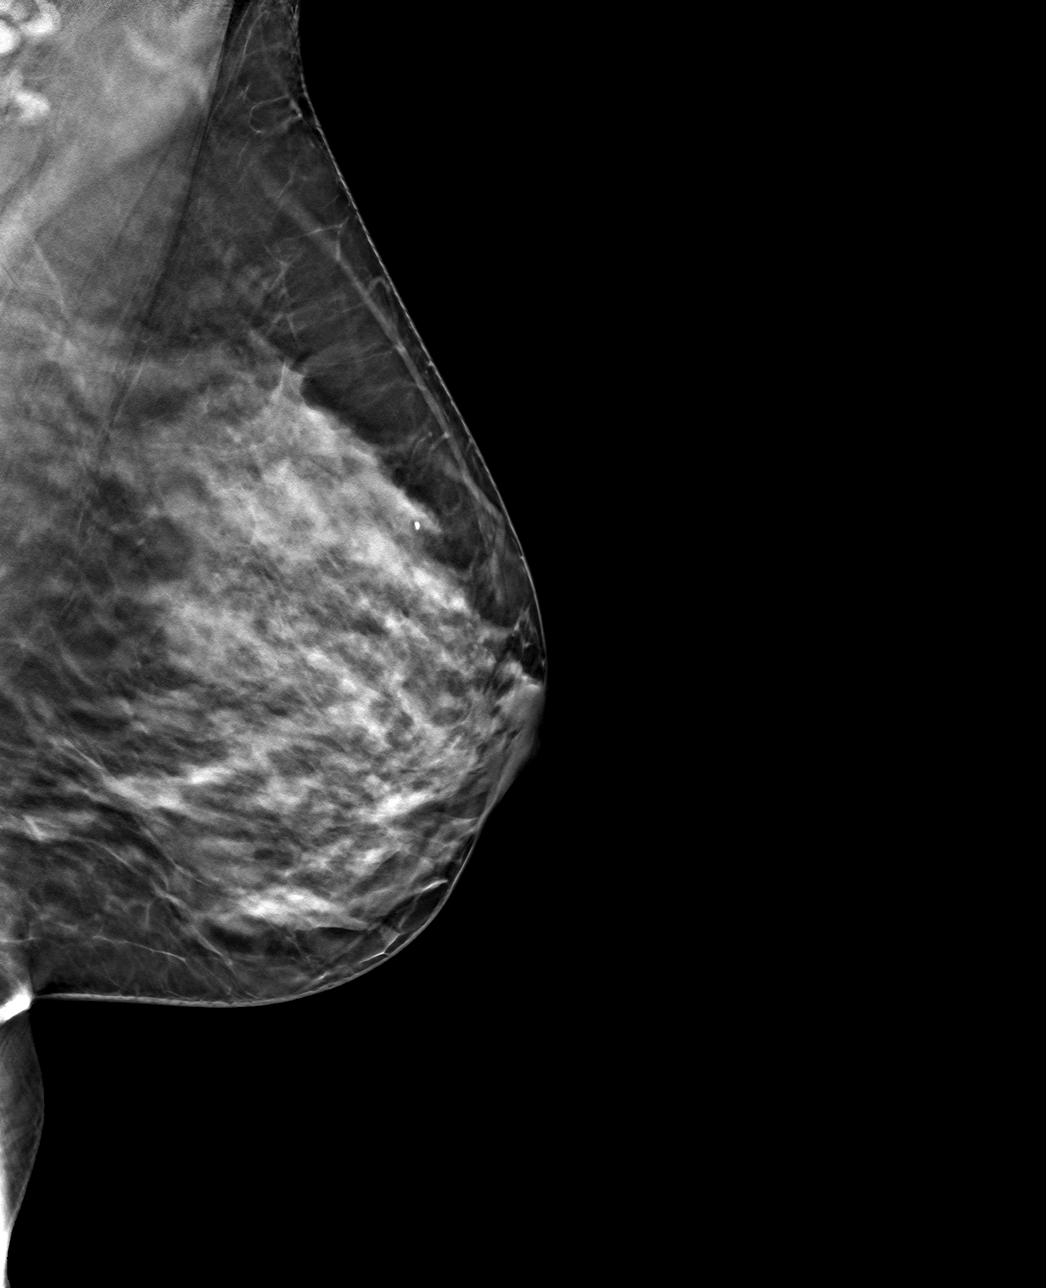

[4 of 12 positions shown; findings below may reference images not displayed]

ACR Breast Density Category c: The breast tissue is heterogeneously
dense, which may obscure small masses.
FINDINGS: Mammographically, there are no new suspicious masses, areas of
architectural distortion or microcalcifications in the left breast.
Again seen are 2 adjacent circumscribed masses in the left breast
lower inner quadrant, far posterior depth, which are stable in size
and continues to demonstrate benign morphology mammographically.

Mammographic images were processed with CAD.

On physical exam, no suspicious masses are palpated.

Targeted ultrasound is performed, showing left breast 7 o'clock 5 cm
from the nipple 2 adjacent stable hypoechoic circumscribed
benign-appearing masses measuring 0.7 x 0.6 x 0.9 cm and 0.6 x 0.7 x
0.3 cm. These masses correspond to the mammographically seen
findings and continue to demonstrate appearance suggestive of
fibroadenomas.
IMPRESSION: Two adjacent stable benign-appearing left breast 7 o'clock masses,
for which continued short-term follow-up is recommended.

RECOMMENDATION:
Bilateral diagnostic mammogram and focused left breast ultrasound in
6 months.

I have discussed the findings and recommendations with the patient.
Results were also provided in writing at the conclusion of the
visit. If applicable, a reminder letter will be sent to the patient
regarding the next appointment.

BI-RADS CATEGORY  3: Probably benign.

## 2018-03-05 IMAGING — US ULTRASOUND LEFT BREAST LIMITED
1 series · 9 of 9 positions shown · non-contrast
Comparison: Previous exam(s).

CLINICAL DATA: Follow-up of 2 probably benign left breast 7 o'clock
masses. This is patient's first six-month follow-up.

EXAM:
DIGITAL DIAGNOSTIC LEFT MAMMOGRAM WITH CAD AND TOMO
ULTRASOUND LEFT BREAST

[Series 1: ultrasound left breast limited · 0.05mm/px · 9 of 9 slices shown]
[im 1/9]
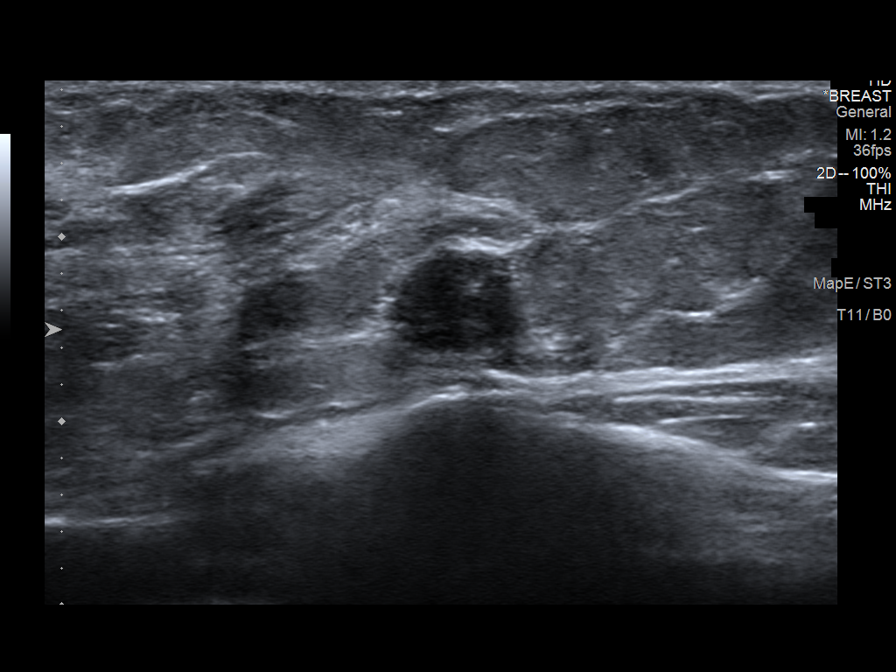
[im 2/9]
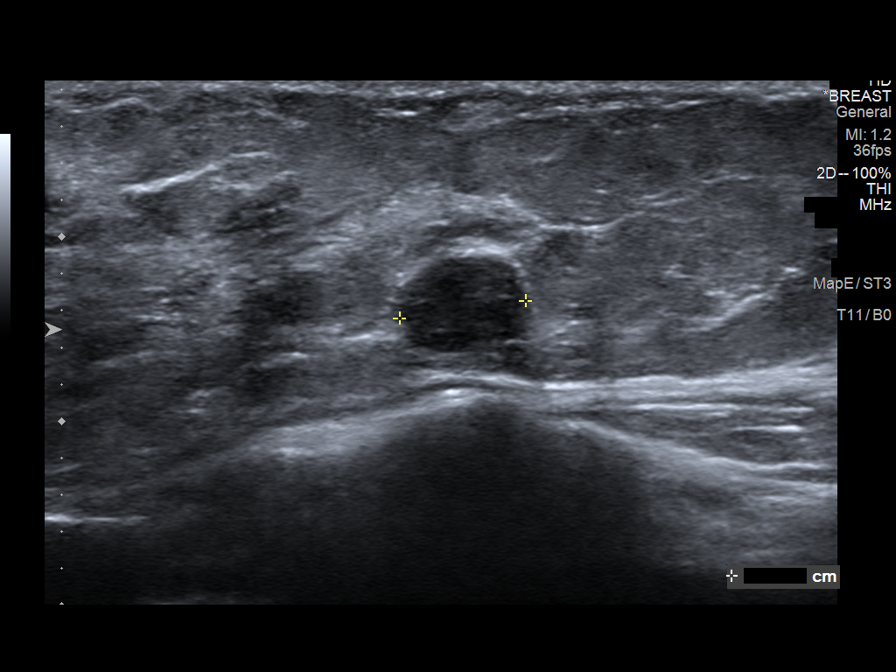
[im 3/9]
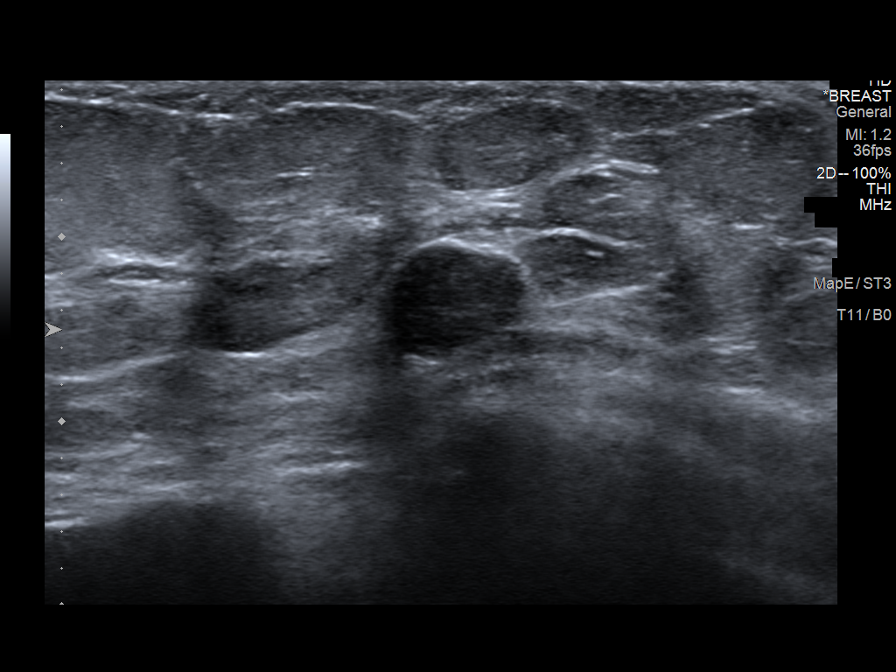
[im 4/9]
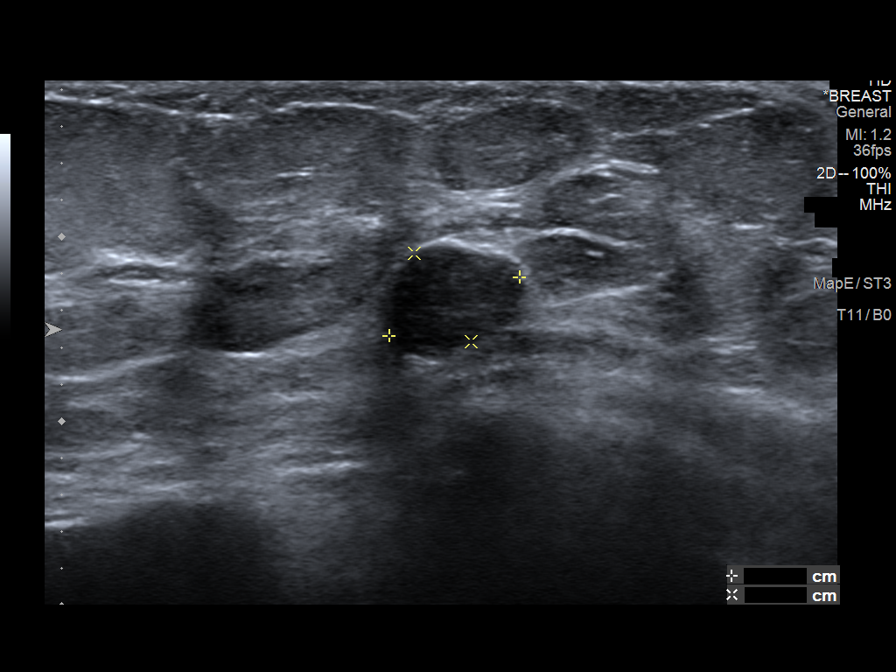
[im 5/9]
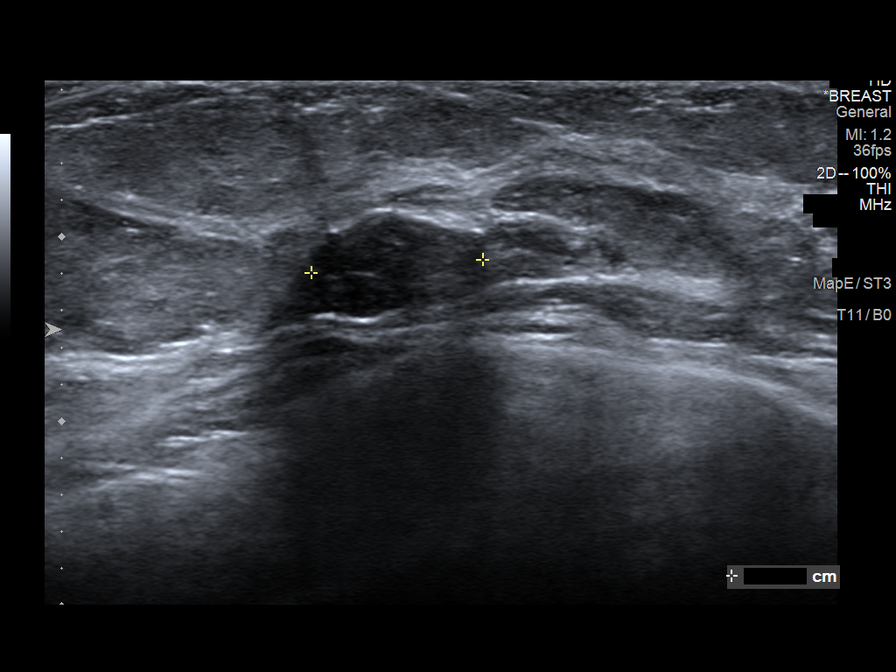
[im 6/9]
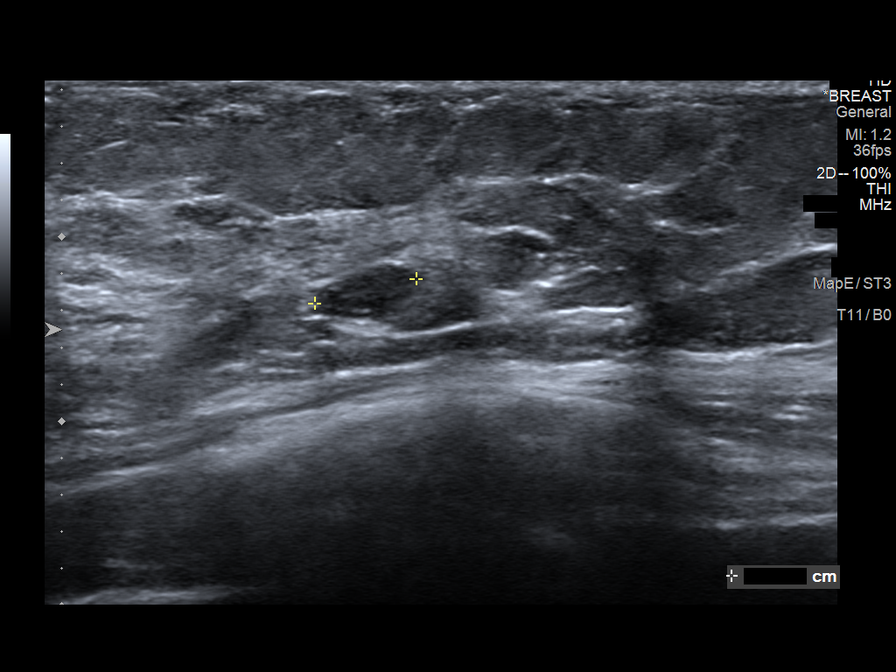
[im 7/9]
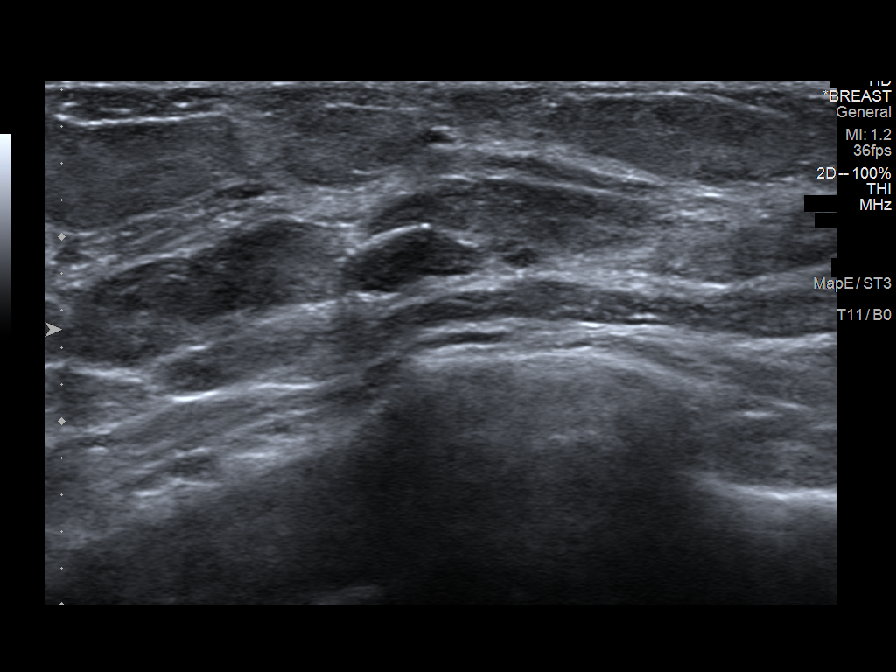
[im 8/9]
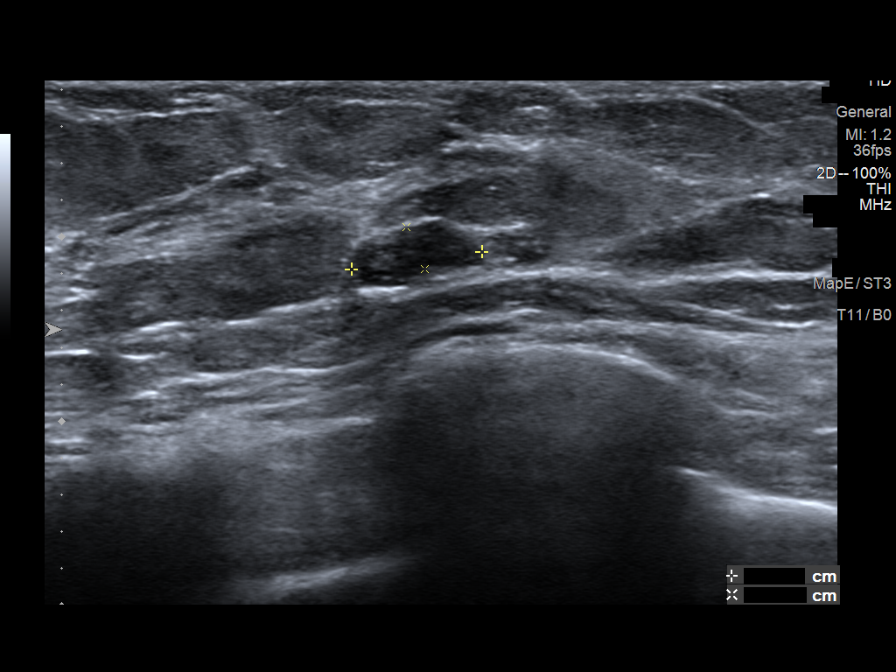
[im 9/9]
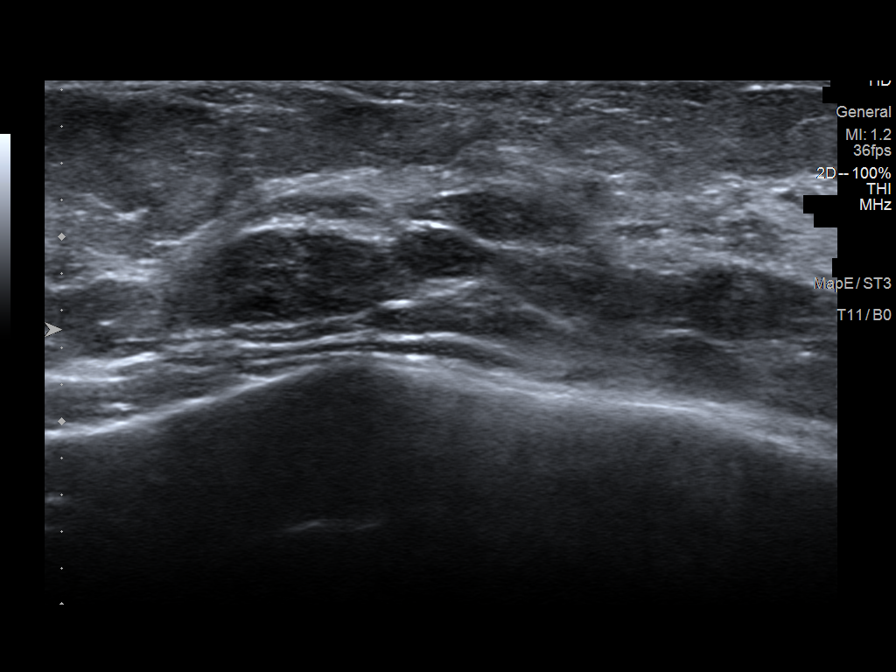

[9 of 9 positions shown; findings below may reference images not displayed]

ACR Breast Density Category c: The breast tissue is heterogeneously
dense, which may obscure small masses.
FINDINGS: Mammographically, there are no new suspicious masses, areas of
architectural distortion or microcalcifications in the left breast.
Again seen are 2 adjacent circumscribed masses in the left breast
lower inner quadrant, far posterior depth, which are stable in size
and continues to demonstrate benign morphology mammographically.

Mammographic images were processed with CAD.

On physical exam, no suspicious masses are palpated.

Targeted ultrasound is performed, showing left breast 7 o'clock 5 cm
from the nipple 2 adjacent stable hypoechoic circumscribed
benign-appearing masses measuring 0.7 x 0.6 x 0.9 cm and 0.6 x 0.7 x
0.3 cm. These masses correspond to the mammographically seen
findings and continue to demonstrate appearance suggestive of
fibroadenomas.
IMPRESSION: Two adjacent stable benign-appearing left breast 7 o'clock masses,
for which continued short-term follow-up is recommended.

RECOMMENDATION:
Bilateral diagnostic mammogram and focused left breast ultrasound in
6 months.

I have discussed the findings and recommendations with the patient.
Results were also provided in writing at the conclusion of the
visit. If applicable, a reminder letter will be sent to the patient
regarding the next appointment.

BI-RADS CATEGORY  3: Probably benign.

## 2018-06-30 ENCOUNTER — Other Ambulatory Visit: Payer: Self-pay | Admitting: Internal Medicine

## 2018-07-25 ENCOUNTER — Other Ambulatory Visit: Payer: Self-pay

## 2018-07-25 MED ORDER — PAROXETINE HCL 10 MG PO TABS: 20.0000 mg | ORAL_TABLET | Freq: Every day | ORAL | 0 refills | Status: DC

## 2018-08-18 ENCOUNTER — Other Ambulatory Visit: Payer: Self-pay | Admitting: Internal Medicine

## 2018-09-05 ENCOUNTER — Other Ambulatory Visit: Payer: 59

## 2018-10-02 ENCOUNTER — Other Ambulatory Visit: Payer: Self-pay | Admitting: Obstetrics and Gynecology

## 2018-10-29 ENCOUNTER — Other Ambulatory Visit: Payer: 59

## 2018-11-11 ENCOUNTER — Other Ambulatory Visit: Payer: Self-pay | Admitting: Internal Medicine

## 2018-12-10 ENCOUNTER — Other Ambulatory Visit: Payer: Self-pay | Admitting: Obstetrics and Gynecology

## 2018-12-10 ENCOUNTER — Other Ambulatory Visit: Payer: Self-pay

## 2018-12-10 ENCOUNTER — Ambulatory Visit
Admission: RE | Admit: 2018-12-10 | Discharge: 2018-12-10 | Disposition: A | Discharge status: Home / Self Care | Payer: 59 | Source: Ambulatory Visit | Attending: Obstetrics and Gynecology | Admitting: Obstetrics and Gynecology

## 2018-12-10 DIAGNOSIS — N63 Unspecified lump in unspecified breast: Secondary | ICD-10-CM

## 2018-12-10 IMAGING — US ULTRASOUND LEFT BREAST LIMITED
1 series · 9 of 9 positions shown · non-contrast
Comparison: Previous exam(s).

CLINICAL DATA: 47-year-old female presenting for follow-up of
probably benign left breast masses.

EXAM:
DIGITAL DIAGNOSTIC BILATERAL MAMMOGRAM WITH CAD AND TOMO
ULTRASOUND BILATERAL BREAST

[Series 1: ultrasound left breast limited · 0.05mm/px · 9 of 9 slices shown]
[im 1/9]
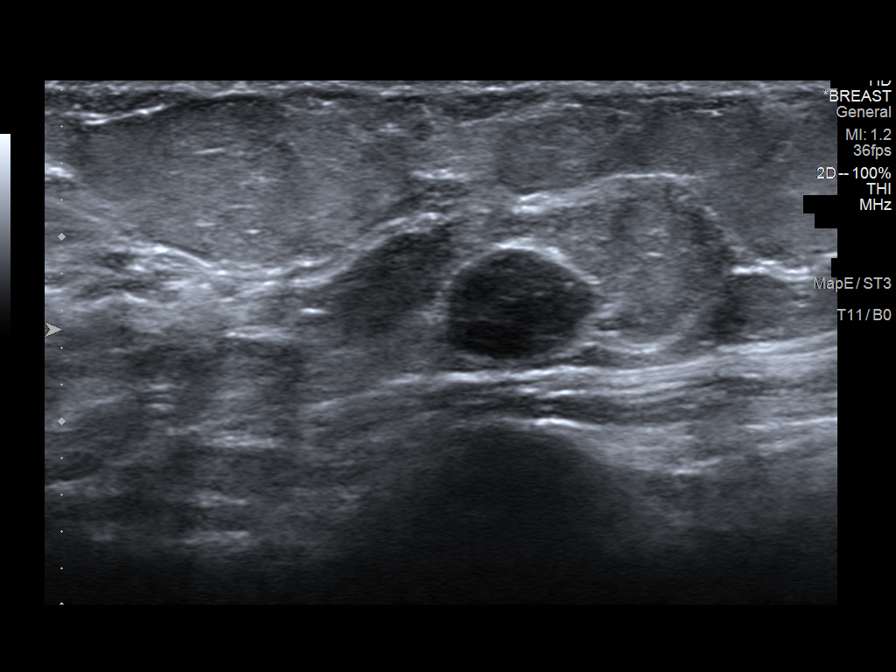
[im 2/9]
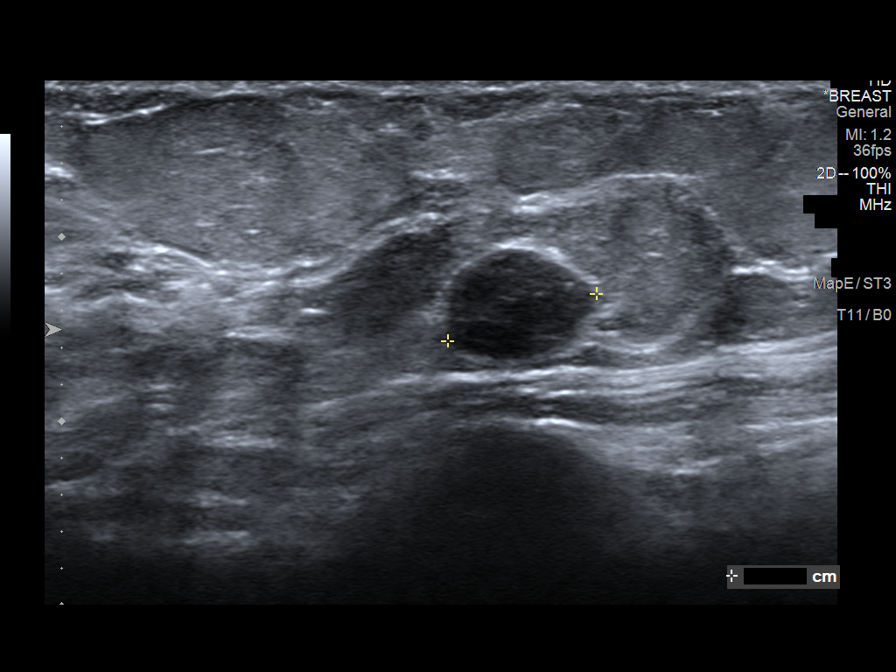
[im 3/9]
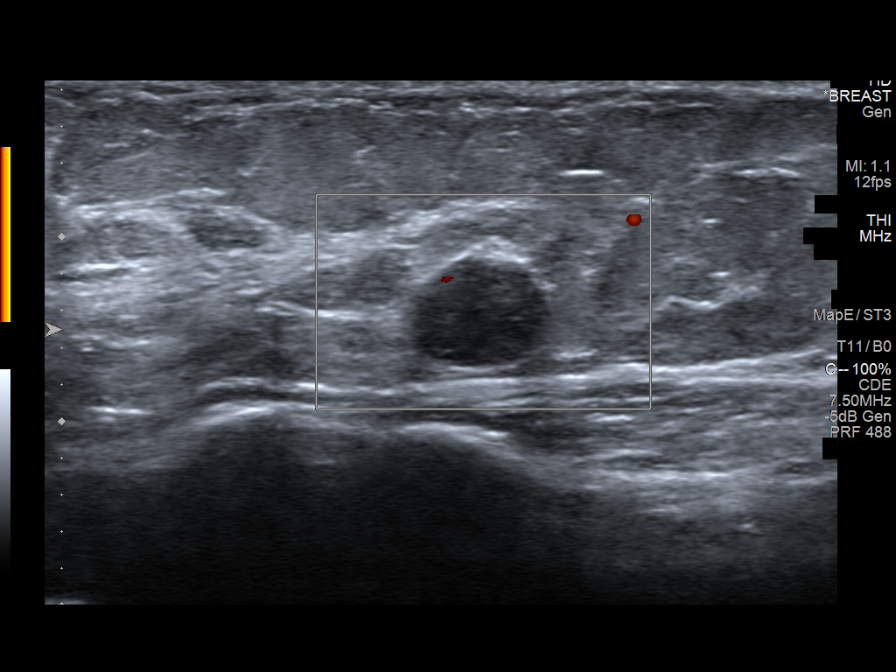
[im 4/9]
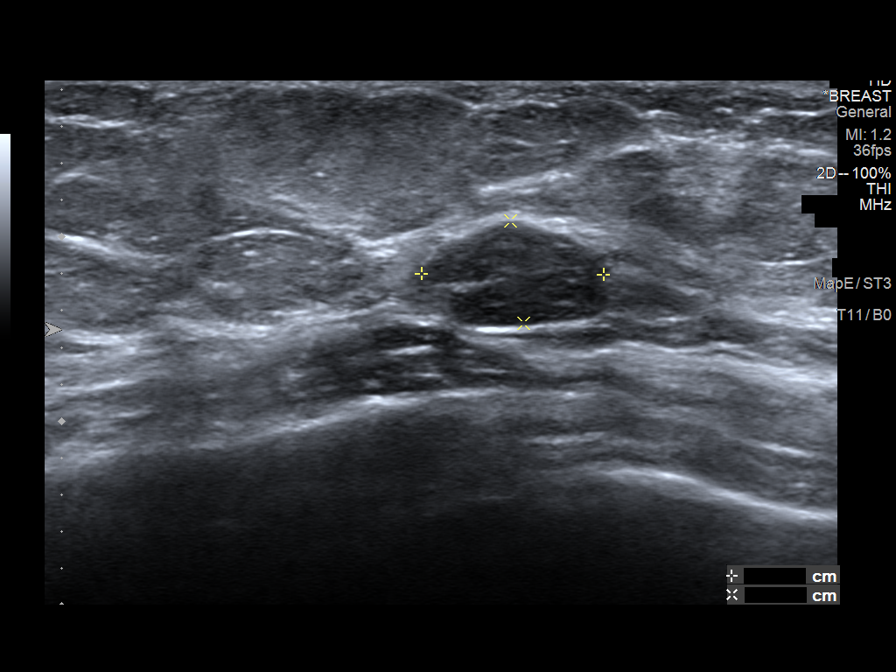
[im 5/9]
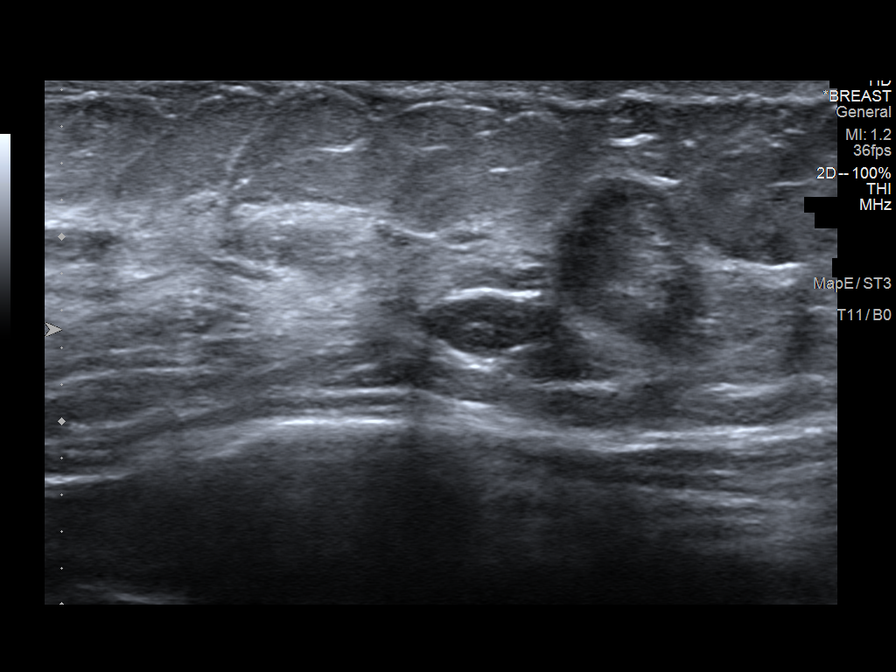
[im 6/9]
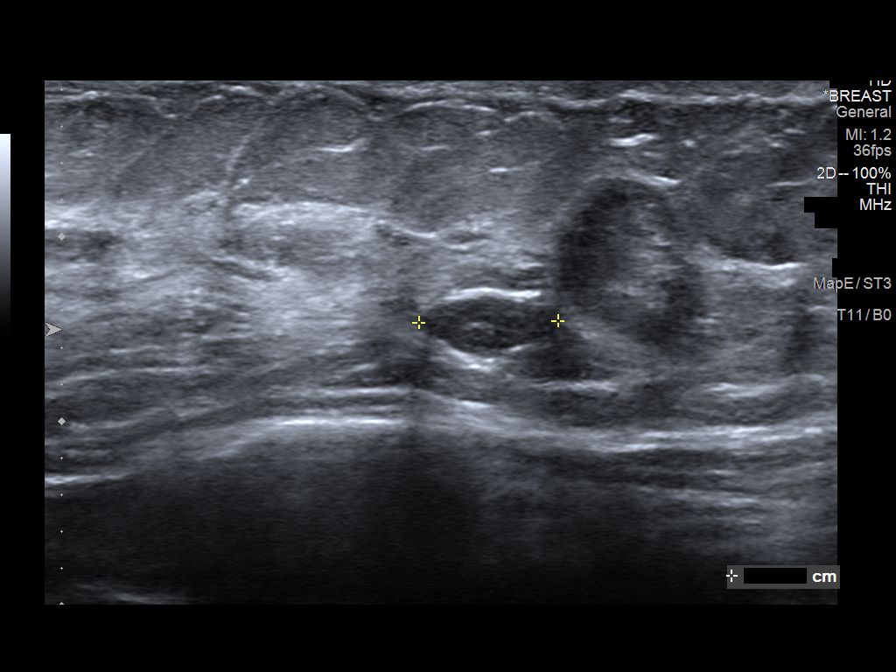
[im 7/9]
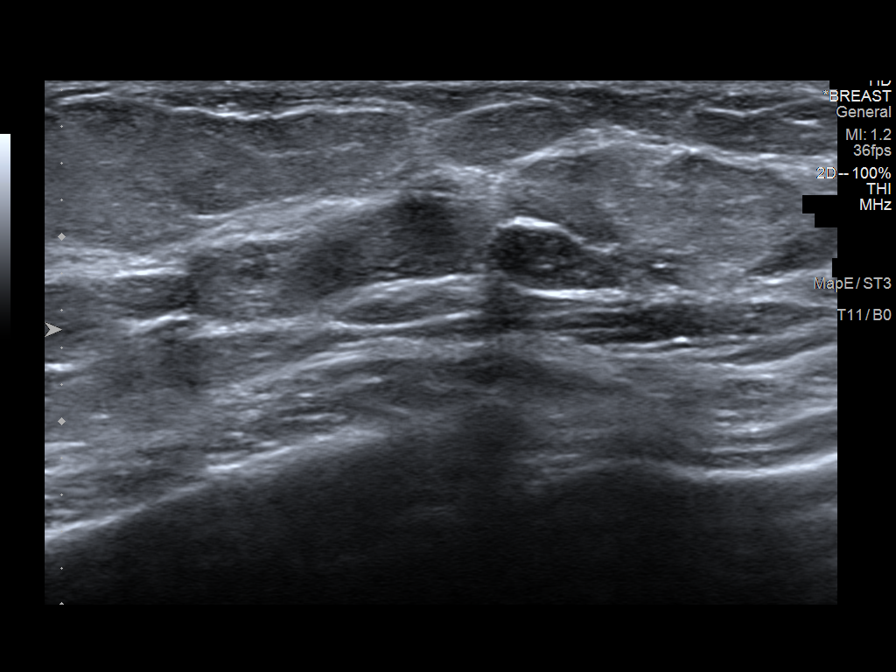
[im 8/9]
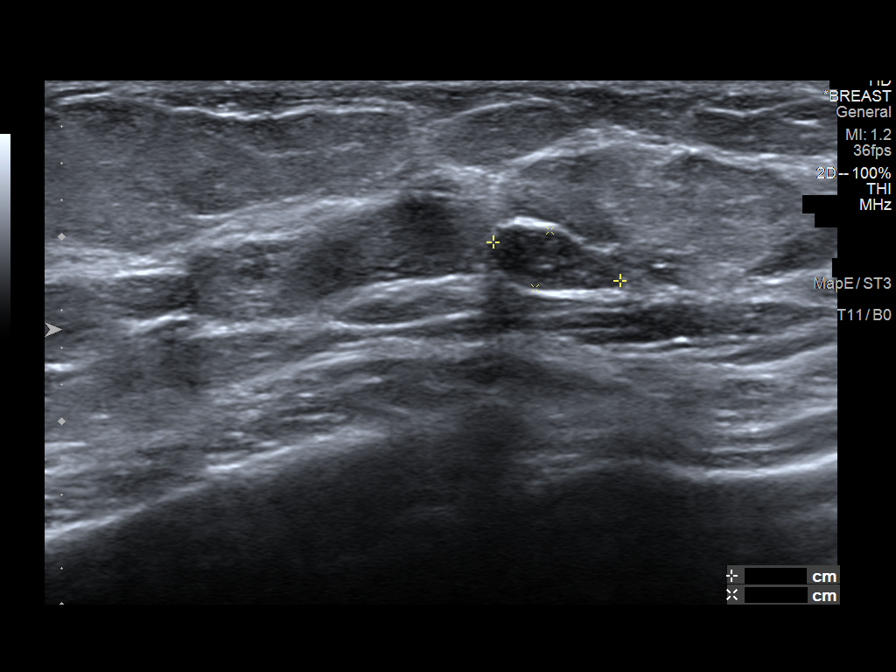
[im 9/9]
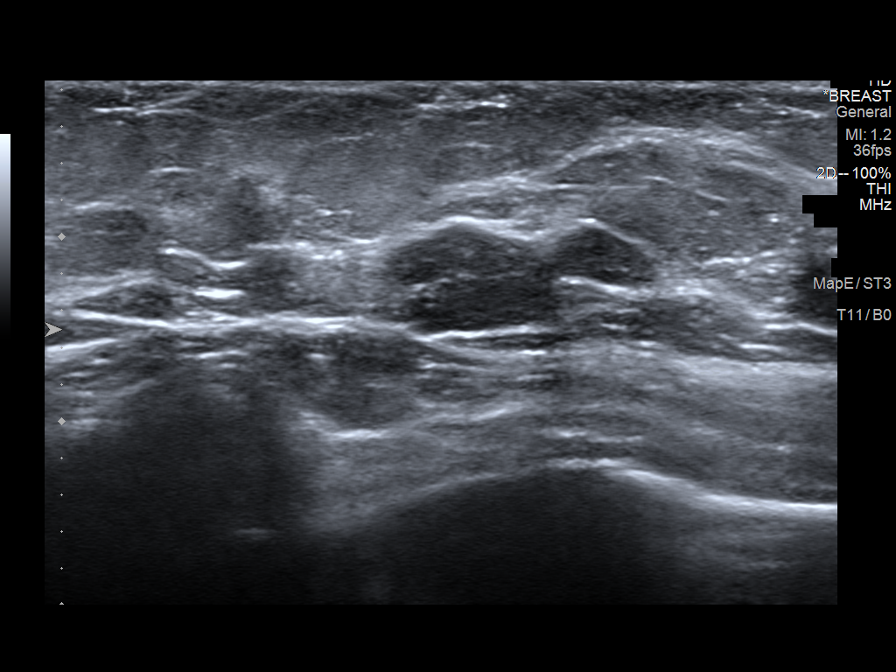

[9 of 9 positions shown; findings below may reference images not displayed]

ACR Breast Density Category c: The breast tissue is heterogeneously
dense, which may obscure small masses.
FINDINGS: The mass in the lower-inner quadrant of the left breast is
mammographically stable. There is an obscured mass in the
upper-outer posterior right breast measuring approximately 1 cm. No
other suspicious calcifications, masses or areas of distortion are
seen in the bilateral breasts.

Mammographic images were processed with CAD.

The 2 adjacent masses in the left breast at 7 o'clock, 5 cm from the
nipple are stable, one measuring 1.0 x 0.6 x 0.9 cm, previously
x 0.6 x 0.7 cm and the other measuring 0.8 x 0.3 x 0.7 cm,
previously 0.7 x 0.3 x 0.6 cm.

In the right breast at 10 o'clock, 10 cm from the nipple
demonstrates an anechoic oval circumscribed mass with posterior
acoustic enhancement measuring 1.0 x 0.5 x 0.9 cm.
IMPRESSION: 1. The 2 adjacent likely benign masses in the left breast are
stable.

2.  There is a benign cyst in the right breast at 10 o'clock.

3.  No mammographic evidence of malignancy in the bilateral breasts.

RECOMMENDATION:
Bilateral diagnostic mammogram and left breast ultrasound in 12
months.

I have discussed the findings and recommendations with the patient.
Results were also provided in writing at the conclusion of the
visit. If applicable, a reminder letter will be sent to the patient
regarding the next appointment.

BI-RADS CATEGORY  3: Probably benign.

## 2018-12-10 IMAGING — MG DIGITAL DIAGNOSTIC BILATERAL MAMMOGRAM WITH TOMO AND CAD
8 series · 8 of 24 positions shown · non-contrast
Comparison: Previous exam(s).

CLINICAL DATA: 47-year-old female presenting for follow-up of
probably benign left breast masses.

EXAM:
DIGITAL DIAGNOSTIC BILATERAL MAMMOGRAM WITH CAD AND TOMO
ULTRASOUND BILATERAL BREAST

[R MLO synth-2D]
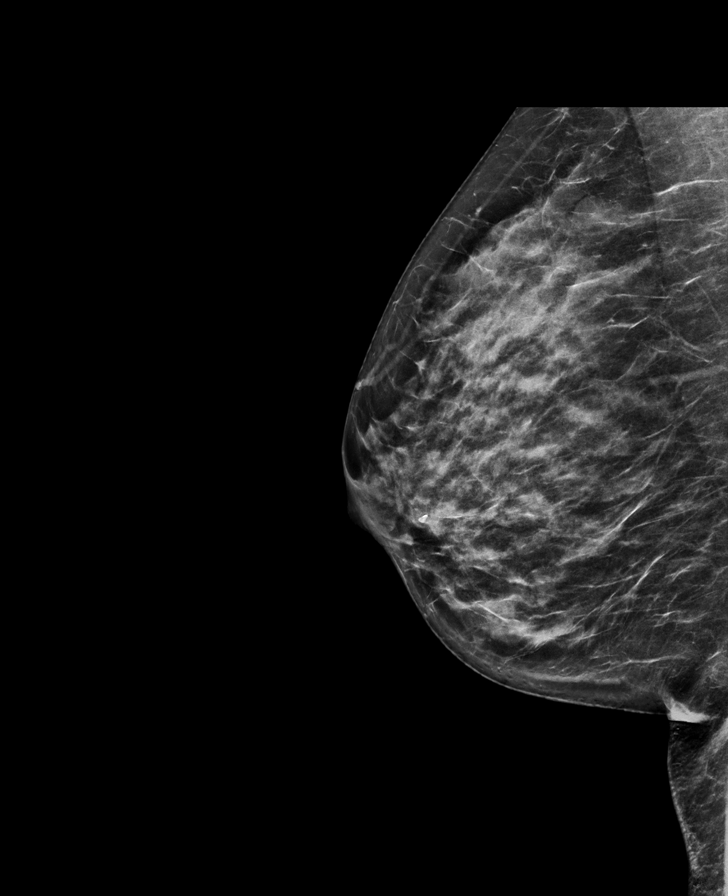

[R CC synth-2D]
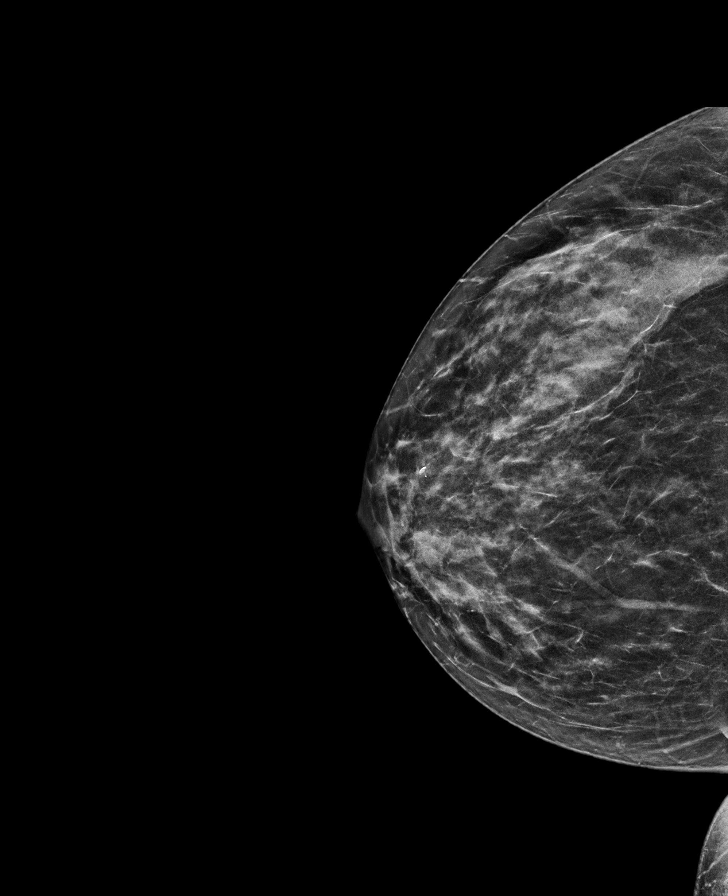

[L MLO synth-2D]
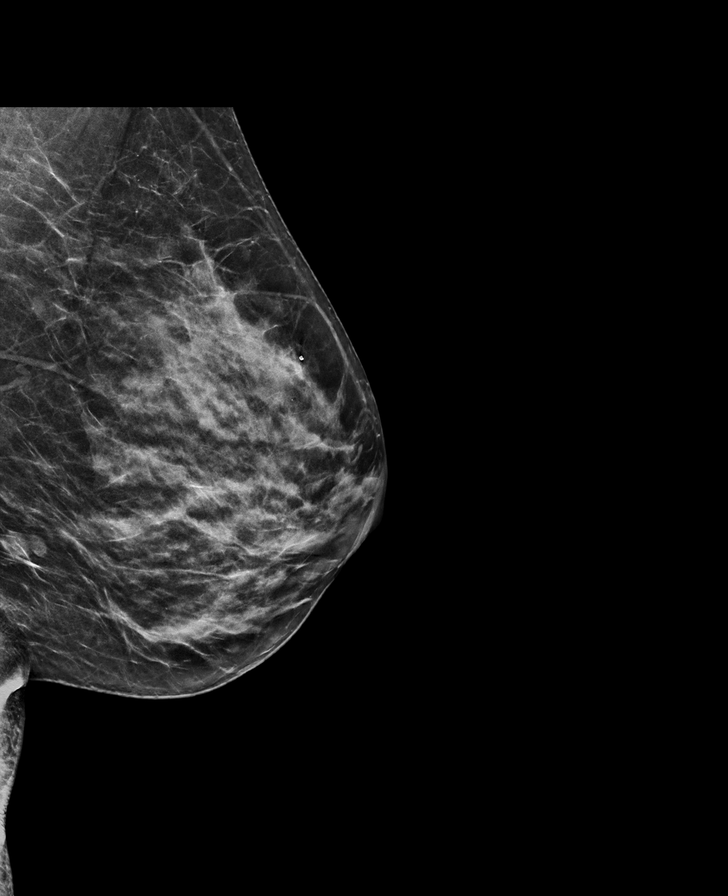

[L CC synth-2D]
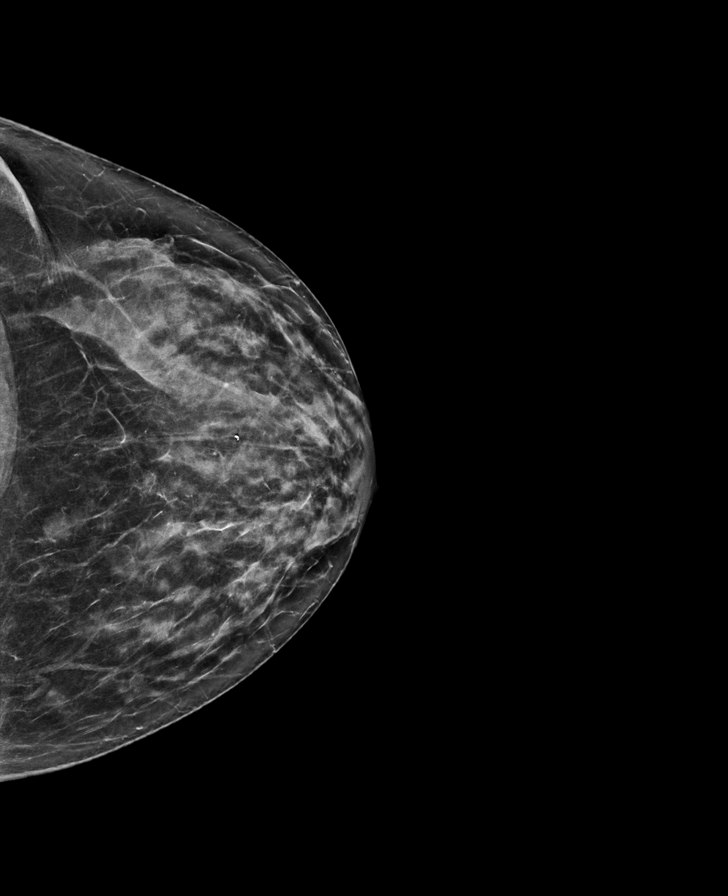

[R CC tomo · tomo slice 29/57.0]
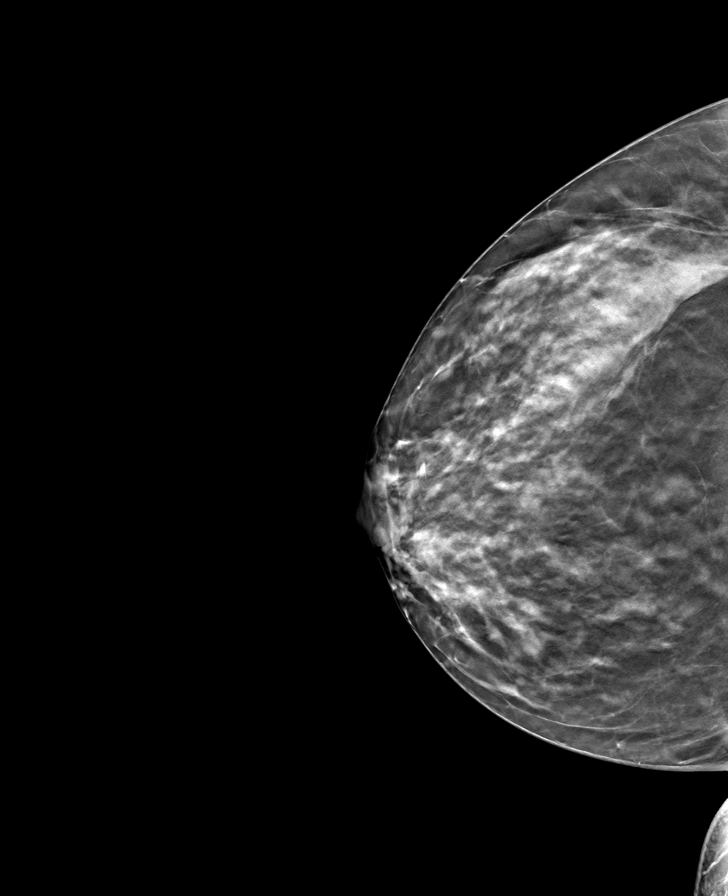

[L CC tomo · tomo slice 32/63.0]
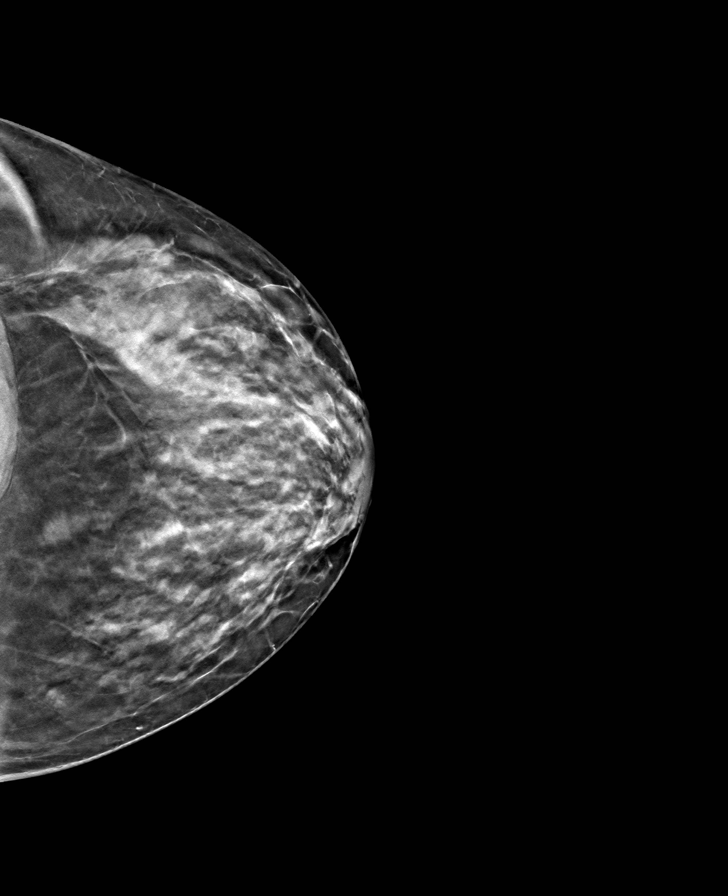

[L MLO tomo · tomo slice 30/59.0]
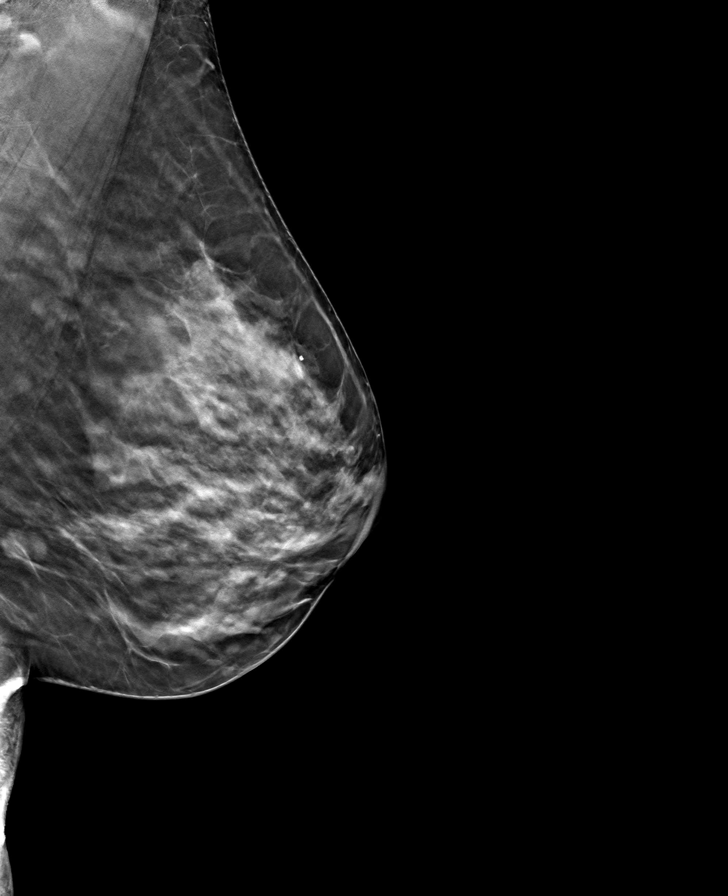

[R MLO tomo · tomo slice 31/62.0]
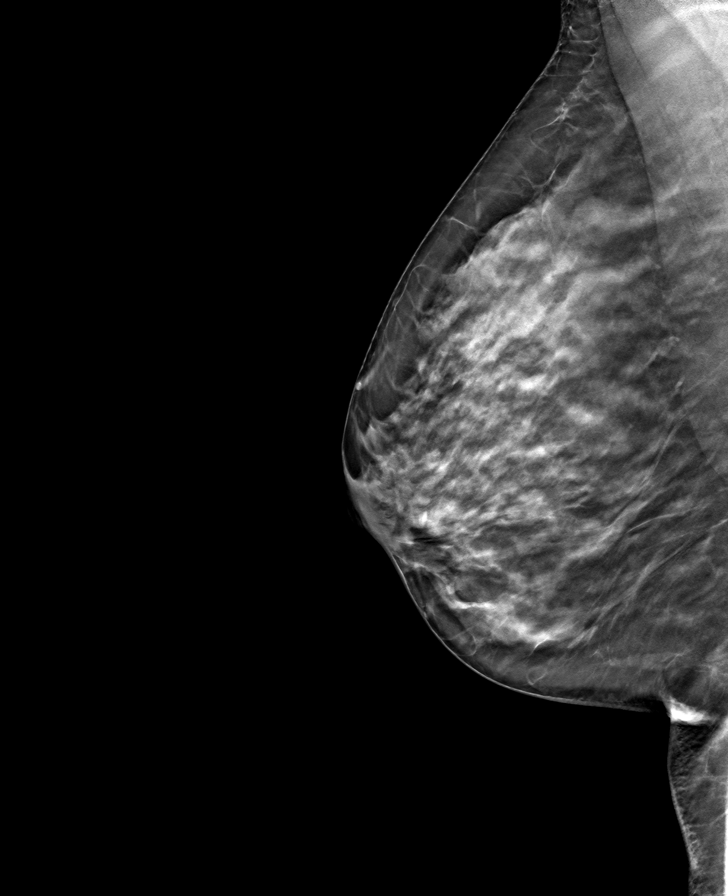

[8 of 24 positions shown; findings below may reference images not displayed]

ACR Breast Density Category c: The breast tissue is heterogeneously
dense, which may obscure small masses.
FINDINGS: The mass in the lower-inner quadrant of the left breast is
mammographically stable. There is an obscured mass in the
upper-outer posterior right breast measuring approximately 1 cm. No
other suspicious calcifications, masses or areas of distortion are
seen in the bilateral breasts.

Mammographic images were processed with CAD.

The 2 adjacent masses in the left breast at 7 o'clock, 5 cm from the
nipple are stable, one measuring 1.0 x 0.6 x 0.9 cm, previously
x 0.6 x 0.7 cm and the other measuring 0.8 x 0.3 x 0.7 cm,
previously 0.7 x 0.3 x 0.6 cm.

In the right breast at 10 o'clock, 10 cm from the nipple
demonstrates an anechoic oval circumscribed mass with posterior
acoustic enhancement measuring 1.0 x 0.5 x 0.9 cm.
IMPRESSION: 1. The 2 adjacent likely benign masses in the left breast are
stable.

2.  There is a benign cyst in the right breast at 10 o'clock.

3.  No mammographic evidence of malignancy in the bilateral breasts.

RECOMMENDATION:
Bilateral diagnostic mammogram and left breast ultrasound in 12
months.

I have discussed the findings and recommendations with the patient.
Results were also provided in writing at the conclusion of the
visit. If applicable, a reminder letter will be sent to the patient
regarding the next appointment.

BI-RADS CATEGORY  3: Probably benign.

## 2018-12-10 IMAGING — US ULTRASOUND RIGHT BREAST LIMITED
1 series · 5 of 5 positions shown · non-contrast
Comparison: Previous exam(s).

CLINICAL DATA: 47-year-old female presenting for follow-up of
probably benign left breast masses.

EXAM:
DIGITAL DIAGNOSTIC BILATERAL MAMMOGRAM WITH CAD AND TOMO
ULTRASOUND BILATERAL BREAST

[Series 1: ultrasound right breast limited · 0.06mm/px · 5 of 5 slices shown]
[im 1/5]
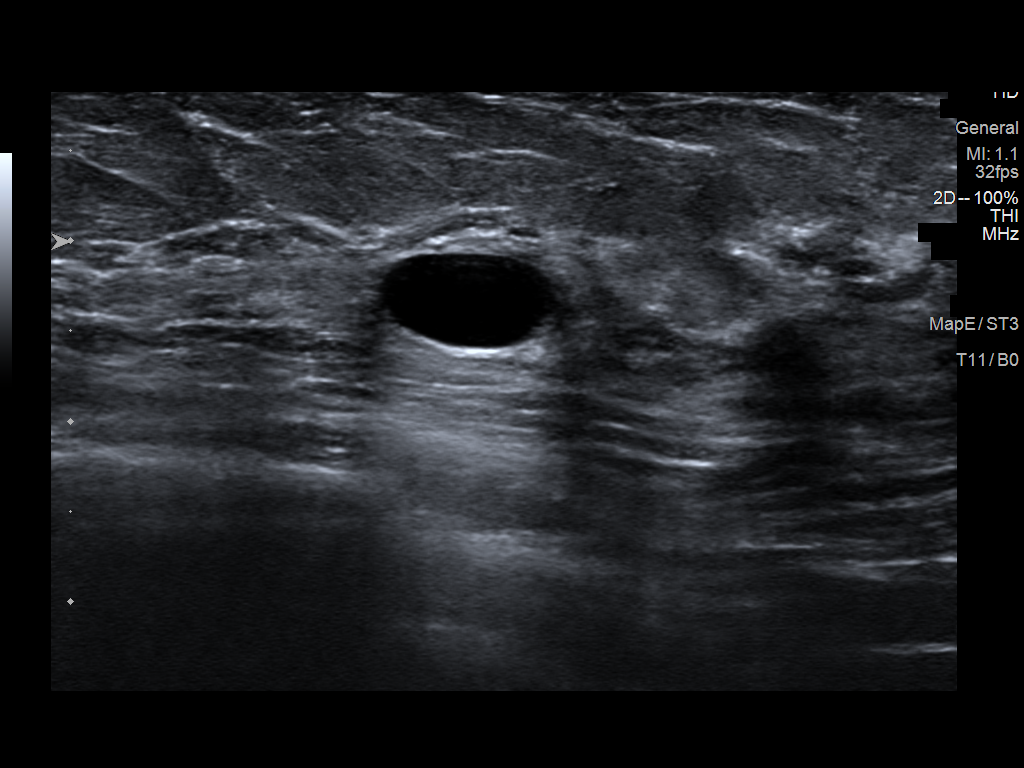
[im 2/5]
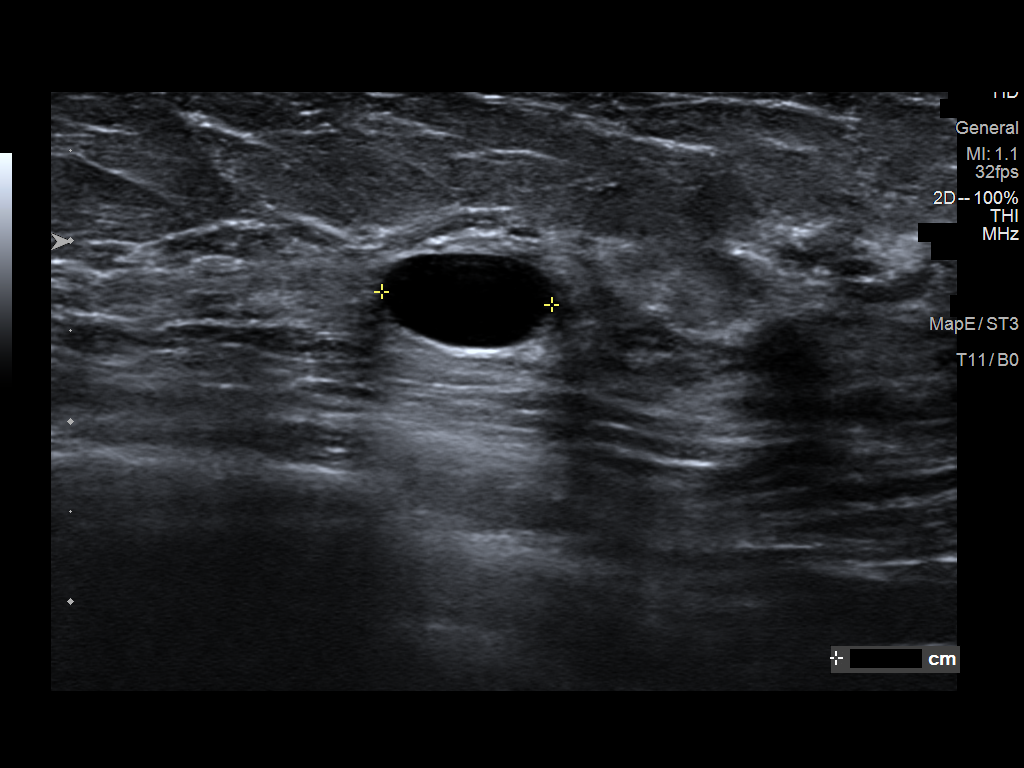
[im 3/5]
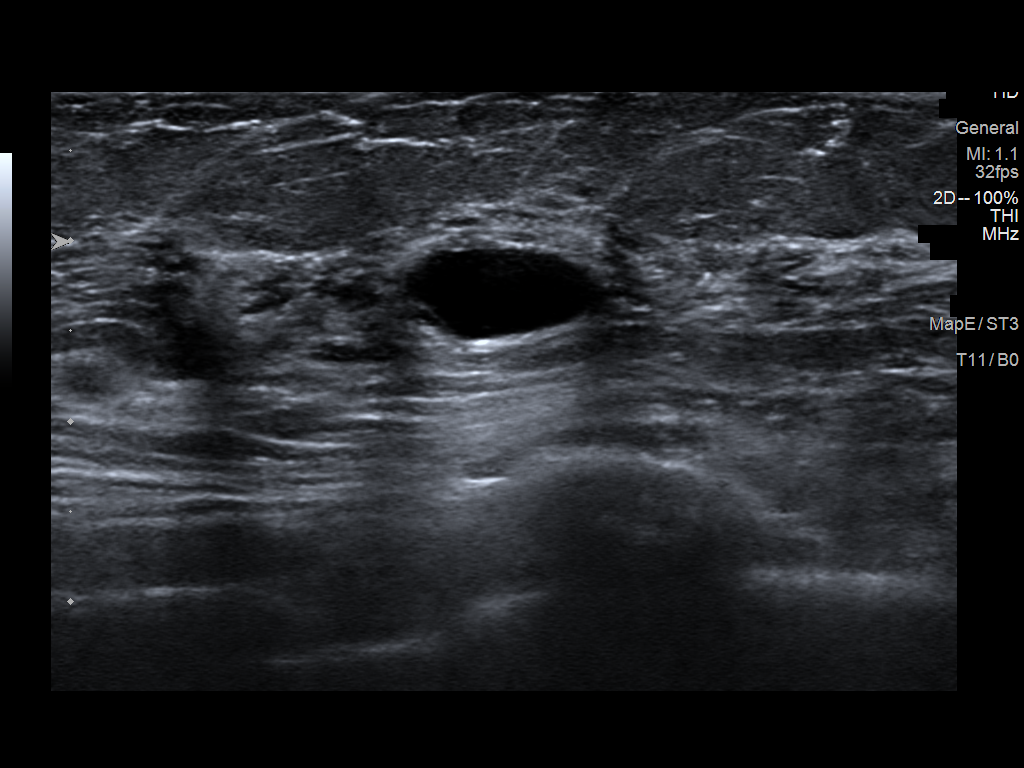
[im 4/5]
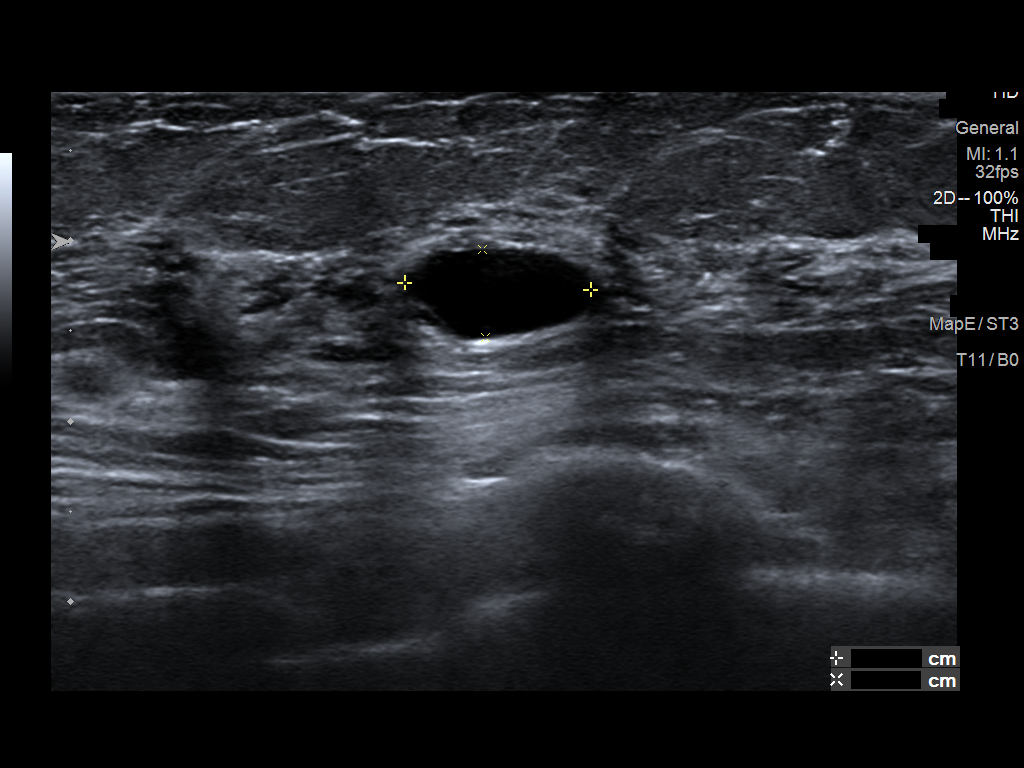
[im 5/5]
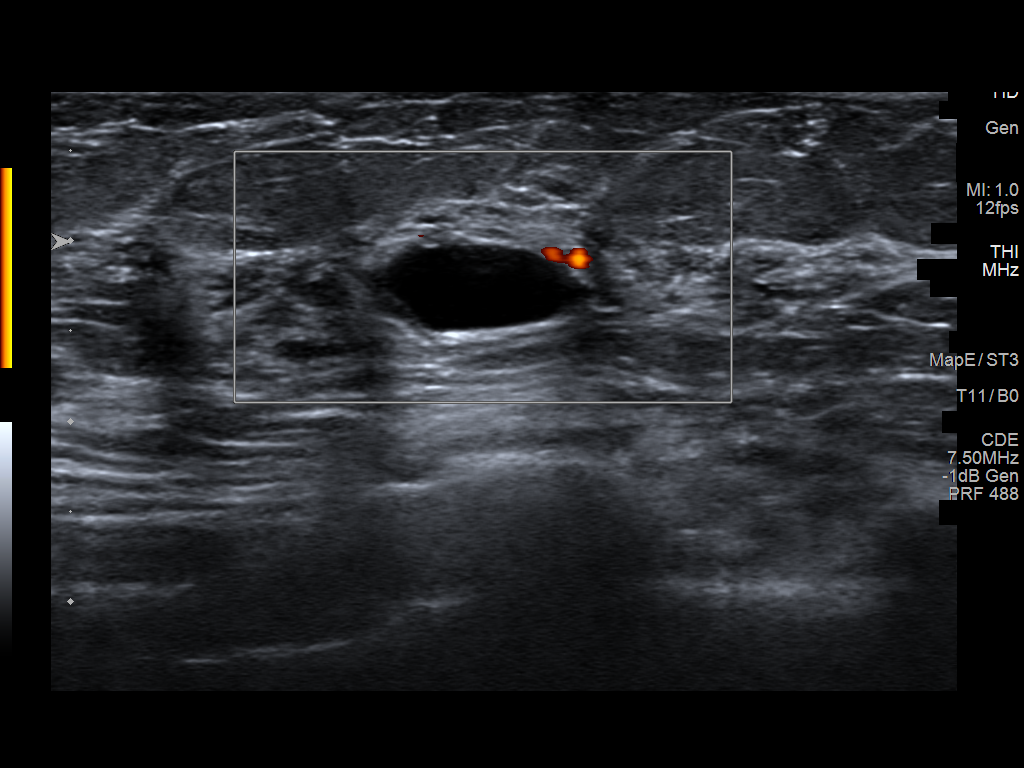

[5 of 5 positions shown; findings below may reference images not displayed]

ACR Breast Density Category c: The breast tissue is heterogeneously
dense, which may obscure small masses.
FINDINGS: The mass in the lower-inner quadrant of the left breast is
mammographically stable. There is an obscured mass in the
upper-outer posterior right breast measuring approximately 1 cm. No
other suspicious calcifications, masses or areas of distortion are
seen in the bilateral breasts.

Mammographic images were processed with CAD.

The 2 adjacent masses in the left breast at 7 o'clock, 5 cm from the
nipple are stable, one measuring 1.0 x 0.6 x 0.9 cm, previously
x 0.6 x 0.7 cm and the other measuring 0.8 x 0.3 x 0.7 cm,
previously 0.7 x 0.3 x 0.6 cm.

In the right breast at 10 o'clock, 10 cm from the nipple
demonstrates an anechoic oval circumscribed mass with posterior
acoustic enhancement measuring 1.0 x 0.5 x 0.9 cm.
IMPRESSION: 1. The 2 adjacent likely benign masses in the left breast are
stable.

2.  There is a benign cyst in the right breast at 10 o'clock.

3.  No mammographic evidence of malignancy in the bilateral breasts.

RECOMMENDATION:
Bilateral diagnostic mammogram and left breast ultrasound in 12
months.

I have discussed the findings and recommendations with the patient.
Results were also provided in writing at the conclusion of the
visit. If applicable, a reminder letter will be sent to the patient
regarding the next appointment.

BI-RADS CATEGORY  3: Probably benign.

## 2018-12-14 ENCOUNTER — Other Ambulatory Visit: Payer: Self-pay | Admitting: Internal Medicine

## 2018-12-31 ENCOUNTER — Other Ambulatory Visit: Payer: Self-pay | Admitting: Internal Medicine

## 2018-12-31 NOTE — Telephone Encounter (Signed)
Pt called and made an appointment for 01/03/19 and would like to know if a few can be called in until appointment. Pt only has enough for tomorrow.

## 2019-01-03 ENCOUNTER — Encounter: Payer: Self-pay | Admitting: Internal Medicine

## 2019-01-03 ENCOUNTER — Ambulatory Visit (INDEPENDENT_AMBULATORY_CARE_PROVIDER_SITE_OTHER): Payer: 59 | Admitting: Internal Medicine

## 2019-01-03 DIAGNOSIS — F419 Anxiety disorder, unspecified: Secondary | ICD-10-CM | POA: Diagnosis not present

## 2019-01-03 DIAGNOSIS — K7689 Other specified diseases of liver: Secondary | ICD-10-CM | POA: Diagnosis not present

## 2019-01-03 MED ORDER — PAROXETINE HCL 20 MG PO TABS: 20.0000 mg | ORAL_TABLET | Freq: Every day | ORAL | 3 refills | Status: DC

## 2019-01-03 NOTE — Assessment & Plan Note (Signed)
Controlled, stable Continue current dose of medication  

## 2019-01-03 NOTE — Progress Notes (Signed)
Virtual Visit via Video Note  I connected with Marie Adams on 01/03/19 at  8:30 AM EDT by a video enabled telemedicine application and verified that I am speaking with the correct person using two identifiers.   I discussed the limitations of evaluation and management by telemedicine and the availability of in person appointments. The patient expressed understanding and agreed to proceed.  The patient is currently at home and I am in the office.    No referring provider.    History of Present Illness: She is here for follow up of her chronic medical conditions.   Anxiety: She is taking her medication daily as prescribed. She denies any side effects from the medication. She feels her anxiety is well controlled and she is happy with her current dose of medication.   Hepatic cyst:  She had an ultrasound last year and was found to have a left sided liver cyst.  A yearly f/u US was advised.     She is exercising.  She denies depression.  She is sleeping well.     She thinks she is in per-menopause.  She has had increased irritability, irregular periods and hot/cold episodes.  She has an appt coming up with her gyn.  She denies any increase anxiety and does not feel we need to increase the paxil.    Review of Systems  Constitutional: Positive for malaise/fatigue. Negative for chills and fever.  Respiratory: Negative for cough, shortness of breath and wheezing.   Cardiovascular: Negative for chest pain and palpitations.  Neurological: Negative for dizziness and headaches.     Social History   Socioeconomic History  . Marital status: Single    Spouse name: Not on file  . Number of children: Not on file  . Years of education: Not on file  . Highest education level: Not on file  Occupational History  . Not on file  Social Needs  . Financial resource strain: Not on file  . Food insecurity    Worry: Not on file    Inability: Not on file  . Transportation needs    Medical:  Not on file    Non-medical: Not on file  Tobacco Use  . Smoking status: Never Smoker  . Smokeless tobacco: Never Used  Substance and Sexual Activity  . Alcohol use: Yes    Alcohol/week: 0.0 standard drinks    Comment: 1 glass of wine nightly  . Drug use: No  . Sexual activity: Not on file  Lifestyle  . Physical activity    Days per week: Not on file    Minutes per session: Not on file  . Stress: Not on file  Relationships  . Social Herbalist on phone: Not on file    Gets together: Not on file    Attends religious service: Not on file    Active member of club or organization: Not on file    Attends meetings of clubs or organizations: Not on file    Relationship status: Not on file  Other Topics Concern  . Not on file  Social History Narrative   Lives with wife Marie Adams) 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      Exercise: treadmill     Observations/Objective: Appears well in NAD Normal mood and affect.   Assessment and Plan:  See Problem List for Assessment and Plan of chronic medical problems.   Follow Up Instructions:    I discussed the assessment and treatment plan with the patient. The  patient was provided an opportunity to ask questions and all were answered. The patient agreed with the plan and demonstrated an understanding of the instructions.   The patient was advised to call back or seek an in-person evaluation if the symptoms worsen or if the condition fails to improve as anticipated.    Binnie Rail, MD

## 2019-01-03 NOTE — Assessment & Plan Note (Signed)
Due for 1 year follow-up of liver cyst Will order abdominal ultrasound

## 2019-01-17 ENCOUNTER — Ambulatory Visit
Admission: RE | Admit: 2019-01-17 | Discharge: 2019-01-17 | Disposition: A | Discharge status: Home / Self Care | Payer: 59 | Source: Ambulatory Visit | Attending: Internal Medicine | Admitting: Internal Medicine

## 2019-01-17 ENCOUNTER — Encounter: Payer: Self-pay | Admitting: Internal Medicine

## 2019-01-17 DIAGNOSIS — K7689 Other specified diseases of liver: Secondary | ICD-10-CM

## 2019-01-17 IMAGING — US US ABDOMEN LIMITED
1 series · 14 of 25 positions shown · non-contrast
Comparison: [DATE].

CLINICAL DATA: Follow-up liver cyst.

EXAM:
ULTRASOUND ABDOMEN LIMITED RIGHT UPPER QUADRANT

[Series 1: us abdomen limited · 0.19mm/px · 14 of 53 slices shown]
[im 1/53]
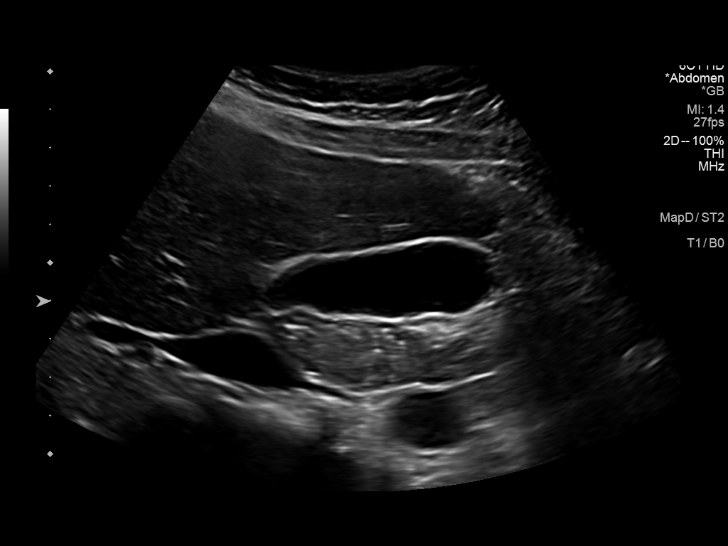
[im 5/53]
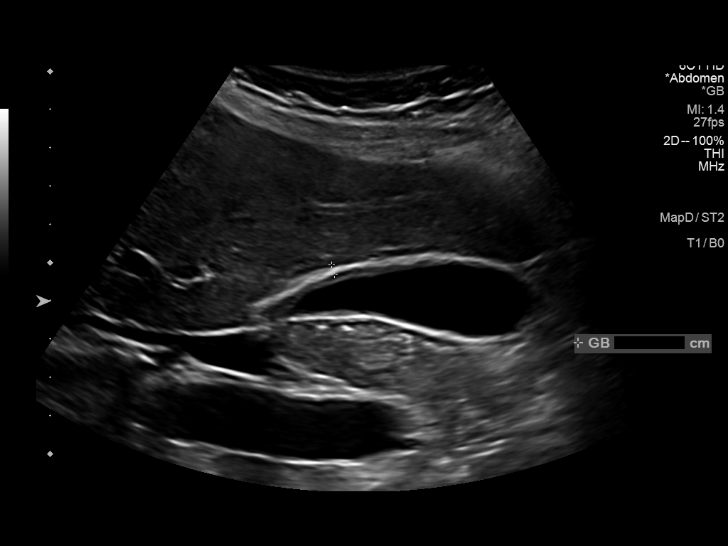
[im 9/53]
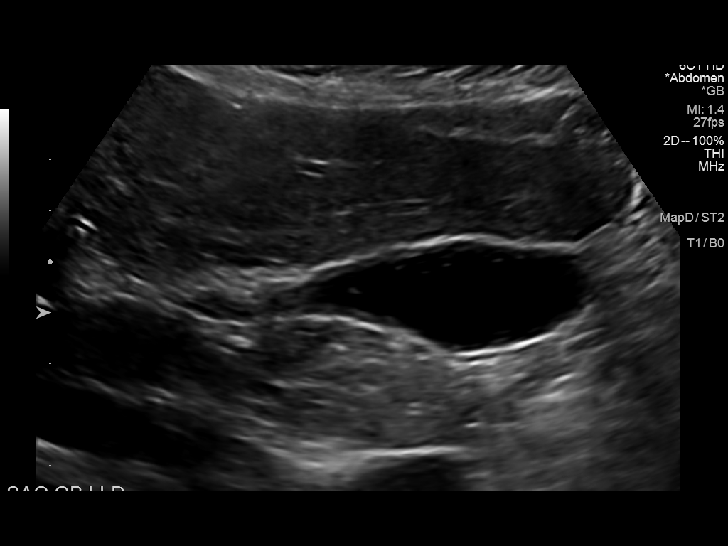
[im 14/53]
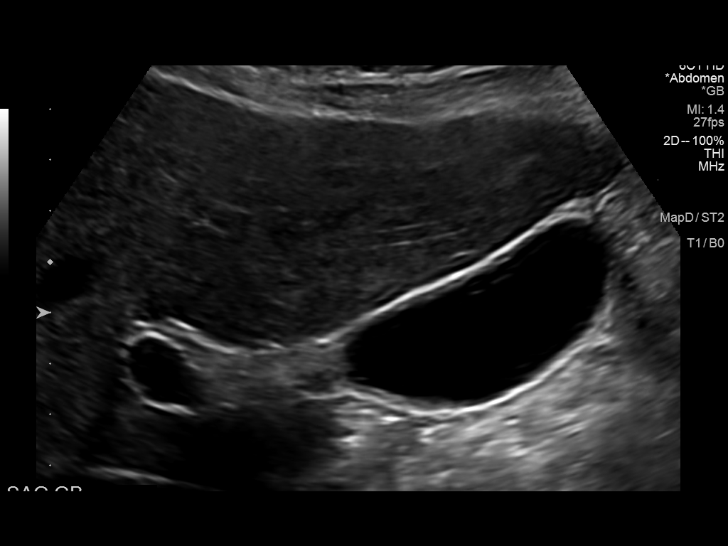
[im 18/53]
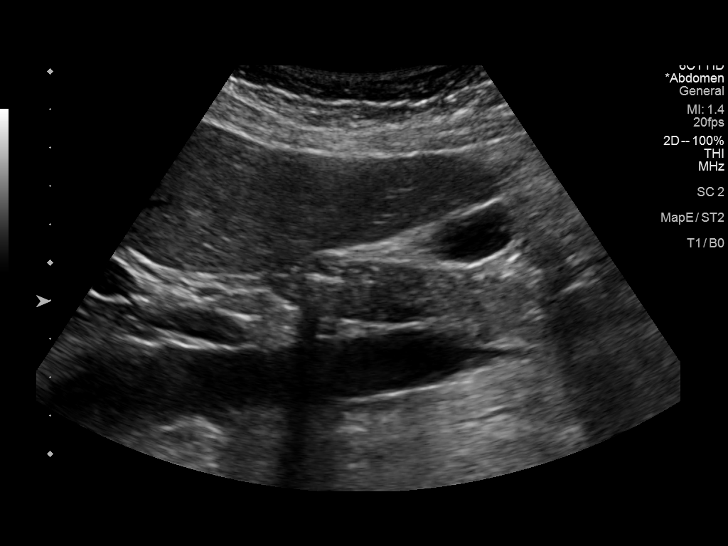
[im 20/53]
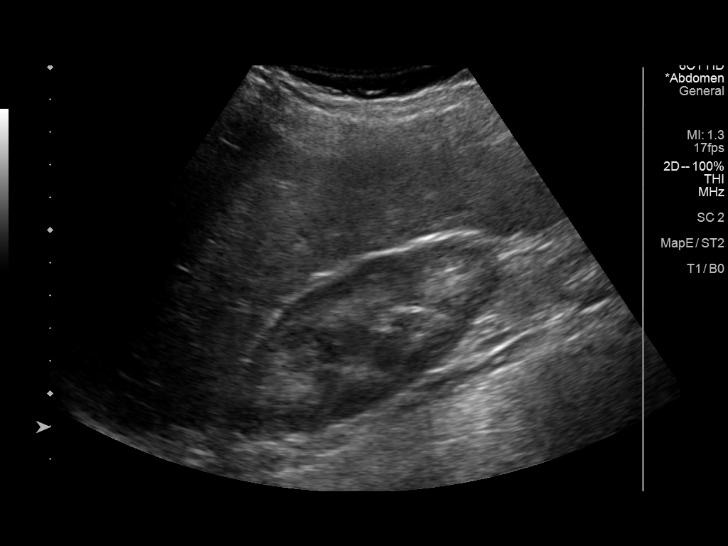
[im 24/53]
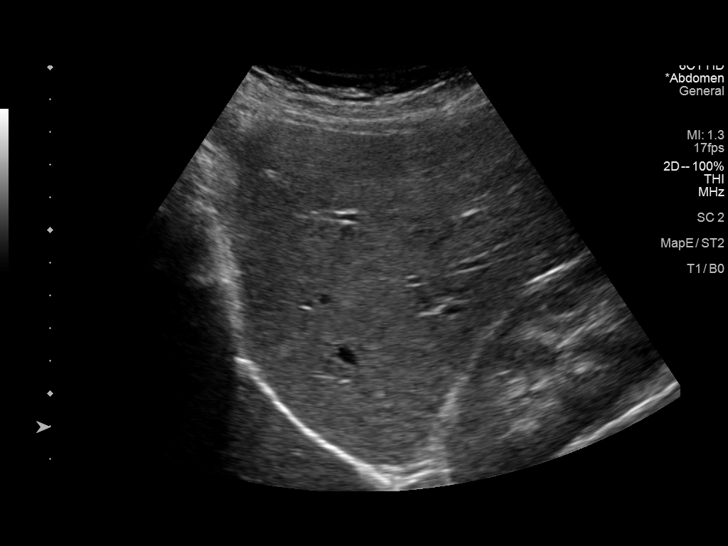
[im 29/53]
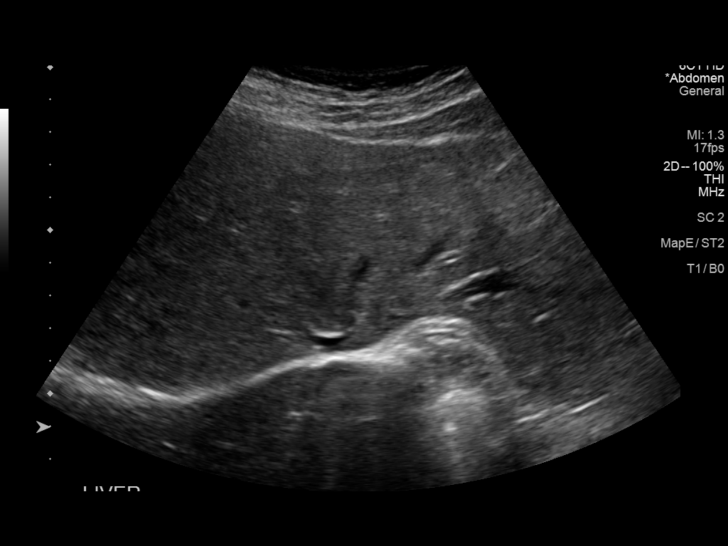
[im 33/53]
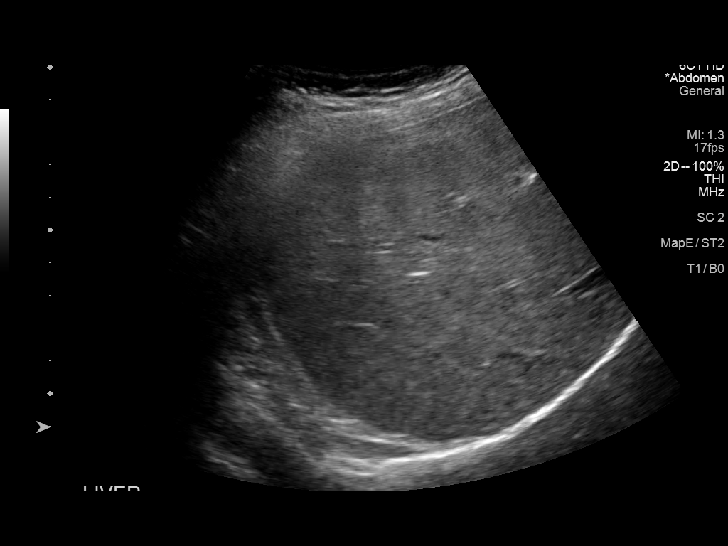
[im 35/53]
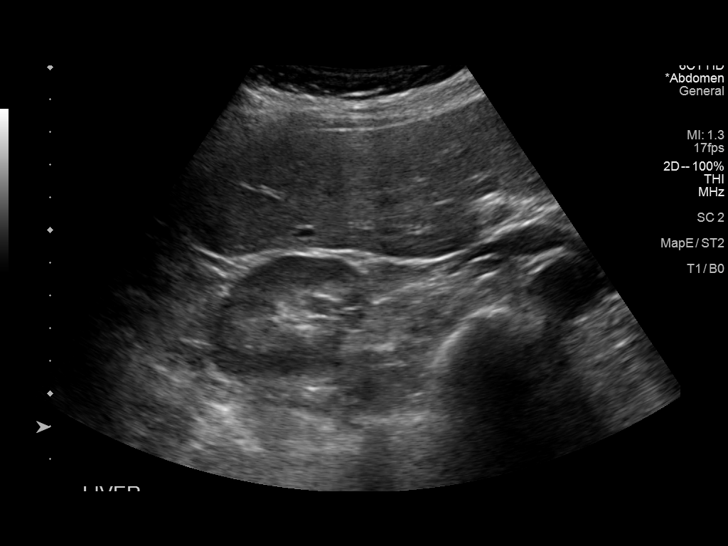
[im 40/53]
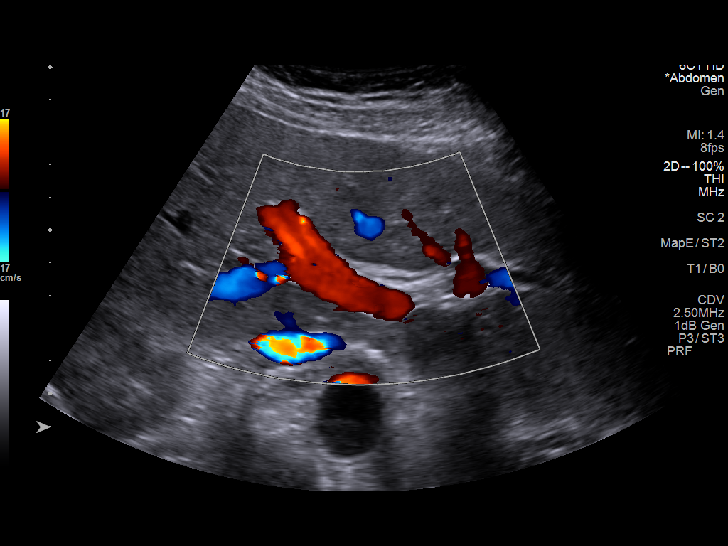
[im 44/53]
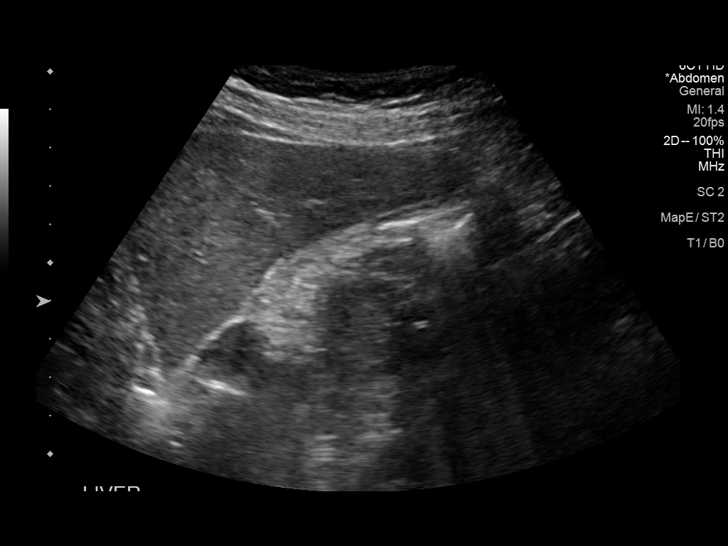
[im 48/53]
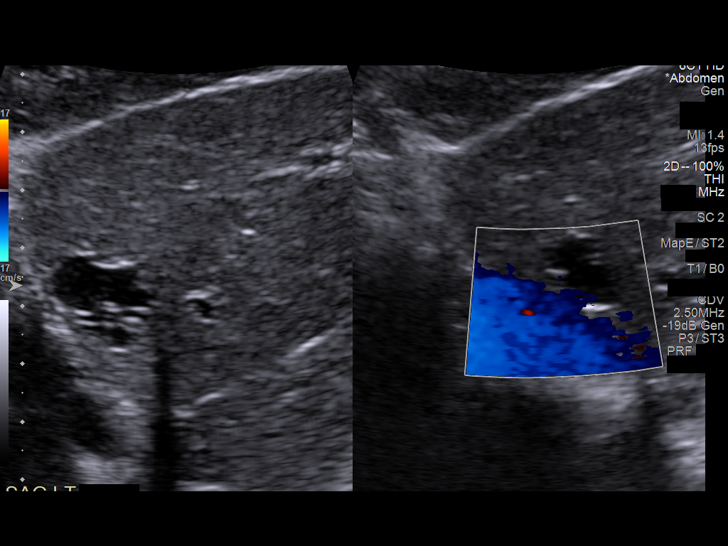
[im 53/53]
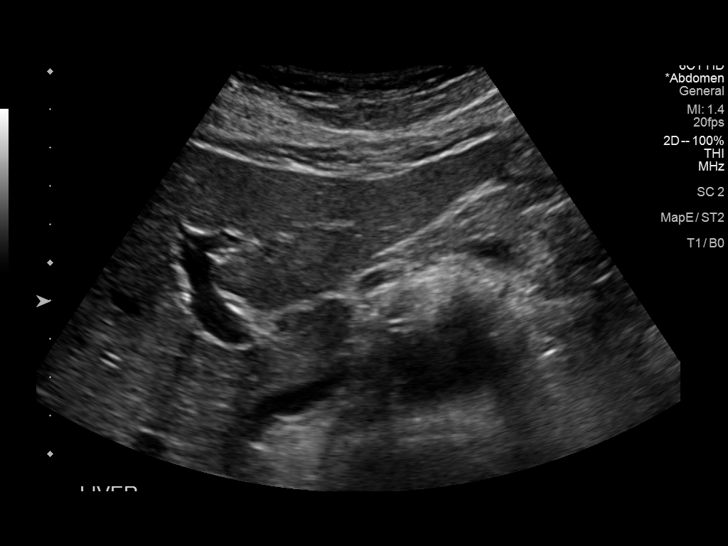

[14 of 25 positions shown; findings below may reference images not displayed]

FINDINGS: Gallbladder:

No gallstones or wall thickening visualized. No sonographic Murphy
sign noted by sonographer.

Common bile duct:

Diameter: 1.9 mm

Liver:

Liver is mildly heterogeneous suggesting fatty infiltration or
hepatocellular disease. 3.1 cm complex cyst, similar findings noted
on prior exam., given appearance and stability this is most likely
benign. Portal vein is patent on color Doppler imaging with normal
direction of blood flow towards the liver.

Other: None.
IMPRESSION: 1.  No gallstones or biliary distention.

2. Heterogeneous hepatic parenchymal pattern consistent with fatty
infiltration or hepatocellular disease. 3.1 cm stable complex
hepatic cyst, most likely benign.

## 2019-01-21 ENCOUNTER — Encounter: Payer: Self-pay | Admitting: Internal Medicine

## 2019-01-21 DIAGNOSIS — D649 Anemia, unspecified: Secondary | ICD-10-CM

## 2019-01-21 DIAGNOSIS — E782 Mixed hyperlipidemia: Secondary | ICD-10-CM

## 2019-01-22 DIAGNOSIS — E785 Hyperlipidemia, unspecified: Secondary | ICD-10-CM | POA: Insufficient documentation

## 2019-01-23 ENCOUNTER — Encounter: Payer: Self-pay | Admitting: Internal Medicine

## 2019-01-31 ENCOUNTER — Other Ambulatory Visit (INDEPENDENT_AMBULATORY_CARE_PROVIDER_SITE_OTHER): Payer: 59

## 2019-01-31 DIAGNOSIS — E782 Mixed hyperlipidemia: Secondary | ICD-10-CM

## 2019-01-31 DIAGNOSIS — D649 Anemia, unspecified: Secondary | ICD-10-CM | POA: Diagnosis not present

## 2019-01-31 LAB — COMPREHENSIVE METABOLIC PANEL
ALT: 15 U/L (ref 0–35)
AST: 16 U/L (ref 0–37)
Albumin: 4.6 g/dL (ref 3.5–5.2)
Alkaline Phosphatase: 37 U/L — ABNORMAL LOW (ref 39–117)
BUN: 15 mg/dL (ref 6–23)
CO2: 28 mEq/L (ref 19–32)
Calcium: 9.7 mg/dL (ref 8.4–10.5)
Chloride: 102 mEq/L (ref 96–112)
Creatinine, Ser: 0.75 mg/dL (ref 0.40–1.20)
GFR: 82.1 mL/min (ref >60.00)
Glucose, Bld: 88 mg/dL (ref 70–99)
Potassium: 4 mEq/L (ref 3.5–5.1)
Sodium: 139 mEq/L (ref 135–145)
Total Bilirubin: 0.5 mg/dL (ref 0.2–1.2)
Total Protein: 7.4 g/dL (ref 6.0–8.3)

## 2019-01-31 LAB — LIPID PANEL
Cholesterol: 227 mg/dL — ABNORMAL HIGH (ref 0–200)
HDL: 62.4 mg/dL (ref >39.00)
LDL Cholesterol: 143 mg/dL — ABNORMAL HIGH (ref 0–99)
NonHDL: 164.34
Total CHOL/HDL Ratio: 4
Triglycerides: 106 mg/dL (ref 0.0–149.0)
VLDL: 21.2 mg/dL (ref 0.0–40.0)

## 2019-01-31 LAB — CBC WITH DIFFERENTIAL/PLATELET
Basophils Absolute: 0.1 10*3/uL (ref 0.0–0.1)
Basophils Relative: 1 % (ref 0.0–3.0)
Eosinophils Absolute: 0.3 10*3/uL (ref 0.0–0.7)
Eosinophils Relative: 3.6 % (ref 0.0–5.0)
HCT: 43.7 % (ref 36.0–46.0)
Hemoglobin: 14.7 g/dL (ref 12.0–15.0)
Lymphocytes Relative: 26.4 % (ref 12.0–46.0)
Lymphs Abs: 2.1 10*3/uL (ref 0.7–4.0)
MCHC: 33.7 g/dL (ref 30.0–36.0)
MCV: 88.7 fl (ref 78.0–100.0)
Monocytes Absolute: 0.7 10*3/uL (ref 0.1–1.0)
Monocytes Relative: 8.9 % (ref 3.0–12.0)
Neutro Abs: 4.8 10*3/uL (ref 1.4–7.7)
Neutrophils Relative %: 60.1 % (ref 43.0–77.0)
Platelets: 307 10*3/uL (ref 150.0–400.0)
RBC: 4.93 Mil/uL (ref 3.87–5.11)
RDW: 13.2 % (ref 11.5–15.5)
WBC: 8.1 10*3/uL (ref 4.0–10.5)

## 2019-01-31 LAB — TSH: TSH: 3.3 u[IU]/mL (ref 0.35–4.50)

## 2019-02-01 LAB — IRON,TIBC AND FERRITIN PANEL
%SAT: 22 % (ref 16–45)
Ferritin: 20 ng/mL (ref 16–232)
Iron: 71 ug/dL (ref 40–190)
TIBC: 316 ug/dL (ref 250–450)

## 2019-02-02 ENCOUNTER — Encounter: Payer: Self-pay | Admitting: Internal Medicine

## 2019-03-13 LAB — HM PAP SMEAR

## 2019-03-14 ENCOUNTER — Other Ambulatory Visit: Payer: Self-pay | Admitting: Internal Medicine

## 2019-05-02 ENCOUNTER — Ambulatory Visit: Payer: Self-pay

## 2019-05-02 NOTE — Telephone Encounter (Signed)
Pt. Reports she started having abdominal pain 1 1/2 weeks ago. Pain comes and goes. Right side next to her belly button. Abdomen is tender to touch. Sometimes has a sharp pain the lasts 15 minutes. Goes away with rest and heat or cold compress. Denies any diarrhea, vomiting or constipation. Has an appointment for Monday. Feels "like I'll be ok until then." Instructed to go to UC if symptoms worsen. Verbalizes understanding.  Answer Assessment - Initial Assessment Questions 1. LOCATION: "Where does it hurt?"      Right side next to belly button 2. RADIATION: "Does the pain shoot anywhere else?" (e.g., chest, back)     Some back pain 3. ONSET: "When did the pain begin?" (e.g., minutes, hours or days ago)      1 week ago 4. SUDDEN: "Gradual or sudden onset?"     Gradual 5. PATTERN "Does the pain come and go, or is it constant?"    - If constant: "Is it getting better, staying the same, or worsening?"      (Note: Constant means the pain never goes away completely; most serious pain is constant and it progresses)     - If intermittent: "How long does it last?" "Do you have pain now?"     (Note: Intermittent means the pain goes away completely between bouts)     Comes and goes 6. SEVERITY: "How bad is the pain?"  (e.g., Scale 1-10; mild, moderate, or severe)   - MILD (1-3): doesn't interfere with normal activities, abdomen soft and not tender to touch    - MODERATE (4-7): interferes with normal activities or awakens from sleep, tender to touch    - SEVERE (8-10): excruciating pain, doubled over, unable to do any normal activities      Now - 5 7. RECURRENT SYMPTOM: "Have you ever had this type of abdominal pain before?" If so, ask: "When was the last time?" and "What happened that time?"      Yes - Unsure of cause 8. CAUSE: "What do you think is causing the abdominal pain?"     Unsure 9. RELIEVING/AGGRAVATING FACTORS: "What makes it better or worse?" (e.g., movement, antacids, bowel movement)  Being still with heat and cold 10. OTHER SYMPTOMS: "Has there been any vomiting, diarrhea, constipation, or urine problems?"       No 11. PREGNANCY: "Is there any chance you are pregnant?" "When was your last menstrual period?"       No  Protocols used: ABDOMINAL PAIN - [facility] [place]

## 2019-05-04 NOTE — Progress Notes (Signed)
Subjective:    Patient ID: Marie Adams, female    DOB: 08/05/1970, 48 y.o.   MRN: [id number]  HPI The patient is here for an acute visit.   Cramping on right side of abdomen:  It started two weeks ago -she had a soreness across her abdomen in the upper abdomen.  3 days ago she had a sharp pain in her right upper quadrant that occurred after she drank her morning coffee, which is nothing unusual.  That pain lasted about 30 minutes.  She has not had any sharp pain since then.  She had another similar episode couple of weeks ago that was in the same location.  We have gotten abdominal ultrasounds in the past due to concerns over gallbladder disease, but they have been normal-no gallstones.  She does get GERD often.  She denies any nausea.  Typically she will drink baking soda and water-she was on Zantac in the past and would prefer not to be on any medication.  Her bowel movements are inconsistent and vary between diarrhea and constipation depending on what she eats.    She has a bump on her left lateral foot.  It is occasionally tender if her shoe is tight.  She just want to make sure was nothing concerning.    Medications and allergies reviewed with patient and updated if appropriate.  Patient Active Problem List   Diagnosis Date Noted  . Hyperlipidemia 01/22/2019  . Liver cyst 07/12/2017  . Abnormal ultrasound of kidney 07/12/2017  . Anemia 06/28/2017  . RUQ pain 06/28/2017  . GERD (gastroesophageal reflux disease) 06/28/2017  . Anxiety 03/02/2009    Current Outpatient Medications on File Prior to Visit  Medication Sig Dispense Refill  . PARoxetine (PAXIL) 20 MG tablet Take 1 tablet (20 mg total) by mouth daily. 90 tablet 3   No current facility-administered medications on file prior to visit.    Past Medical History:  Diagnosis Date  . ANXIETY     Past Surgical History:  Procedure Laterality Date  . BREAST EXCISIONAL BIOPSY Left   . BREAST SURGERY  1990   Breast biopsy - benign    Social History   Socioeconomic History  . Marital status: Single    Spouse name: Not on file  . Number of children: Not on file  . Years of education: Not on file  . Highest education level: Not on file  Occupational History  . Not on file  Tobacco Use  . Smoking status: Never Smoker  . Smokeless tobacco: Never Used  Substance and Sexual Activity  . Alcohol use: Yes    Alcohol/week: 0.0 standard drinks    Comment: 1 glass of wine nightly  . Drug use: No  . Sexual activity: Not on file  Other Topics Concern  . Not on file  Social History Narrative   Lives with wife [name]) 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      Exercise: treadmill   Social Determinants of Health   Financial Resource Strain:   . Difficulty of Paying Living Expenses: Not on file  Food Insecurity:   . Worried About Charity fundraiser in the Last Year: Not on file  . Ran Out of Food in the Last Year: Not on file  Transportation Needs:   . Lack of Transportation (Medical): Not on file  . Lack of Transportation (Non-Medical): Not on file  Physical Activity:   . Days of Exercise per Week: Not on file  . Minutes of  Exercise per Session: Not on file  Stress:   . Feeling of Stress : Not on file  Social Connections:   . Frequency of Communication with Friends and Family: Not on file  . Frequency of Social Gatherings with Friends and Family: Not on file  . Attends Religious Services: Not on file  . Active Member of Clubs or Organizations: Not on file  . Attends Archivist Meetings: Not on file  . Marital Status: Not on file    Family History  Problem Relation Age of Onset  . Arthritis Maternal Grandmother   . Diabetes Father        borderline  . Hyperlipidemia Father   . Hypertension Father   . Transient ischemic attack Paternal Grandfather   . Hypertension Mother     Review of Systems     Objective:   Vitals:   05/05/19 0925  BP: (!) 146/90  Pulse: 86  Resp:  16  Temp: 98.2 F (36.8 C)  SpO2: 99%   BP Readings from Last 3 Encounters:  05/05/19 (!) 146/90  06/28/17 (!) 142/94  01/28/16 124/80   Wt Readings from Last 3 Encounters:  05/05/19 160 lb 9.6 oz (72.8 kg)  06/28/17 162 lb (73.5 kg)  01/28/16 153 lb (69.4 kg)   Body mass index is 26.73 kg/m.   Physical Exam Constitutional:      General: She is not in acute distress.    Appearance: Normal appearance. She is not ill-appearing.  HENT:     Head: Normocephalic and atraumatic.  Abdominal:     General: Abdomen is flat. There is no distension.     Palpations: Abdomen is soft. There is no mass.     Tenderness: There is no abdominal tenderness. There is no guarding or rebound.     Hernia: No hernia is present.  Skin:    General: Skin is warm and dry.     Findings: No erythema.     Comments: Mobile, pea-sized lump lateral mid left foot-likely ganglion cyst  Neurological:     Mental Status: She is alert.            Assessment & Plan:    See Problem List for Assessment and Plan of chronic medical problems.    This visit occurred during the SARS-CoV-2 public health emergency.  Safety protocols were in place, including screening questions prior to the visit, additional usage of staff PPE, and extensive cleaning of exam room while observing appropriate contact time as indicated for disinfecting solutions.

## 2019-05-05 ENCOUNTER — Other Ambulatory Visit: Payer: Self-pay

## 2019-05-05 ENCOUNTER — Ambulatory Visit (INDEPENDENT_AMBULATORY_CARE_PROVIDER_SITE_OTHER): Payer: 59 | Admitting: Internal Medicine

## 2019-05-05 ENCOUNTER — Encounter: Payer: Self-pay | Admitting: Internal Medicine

## 2019-05-05 VITALS — BP 146/90 | HR 86 | Temp 98.2°F | Resp 16 | Ht 65.0 in | Wt 160.6 lb

## 2019-05-05 DIAGNOSIS — Z23 Encounter for immunization: Secondary | ICD-10-CM

## 2019-05-05 DIAGNOSIS — M67472 Ganglion, left ankle and foot: Secondary | ICD-10-CM

## 2019-05-05 DIAGNOSIS — R1011 Right upper quadrant pain: Secondary | ICD-10-CM

## 2019-05-05 NOTE — Assessment & Plan Note (Signed)
Lateral left foot Mobile, nontender Benign in nature If causes discomfort can refer to podiatry

## 2019-05-05 NOTE — Assessment & Plan Note (Signed)
Has had some soreness across upper abdomen and sharp right upper quadrant pain concerning for possible gallbladder disease versus atypical GERD Ultrasounds in the past have revealed no gallstones Will check HIDA Discussed treating GERD and if HIDA shows normal function may need to start good medication daily or refer to GI

## 2019-05-05 NOTE — Patient Instructions (Addendum)
A scan of your gallbladder was ordered.  Someone from [facility] will call you to schedule this.    Tetanus vaccine was given today.

## 2019-05-15 ENCOUNTER — Other Ambulatory Visit: Payer: Self-pay | Admitting: Internal Medicine

## 2019-05-19 ENCOUNTER — Other Ambulatory Visit: Payer: Self-pay | Admitting: Internal Medicine

## 2019-05-22 ENCOUNTER — Ambulatory Visit (HOSPITAL_COMMUNITY): Payer: 59

## 2019-05-28 ENCOUNTER — Ambulatory Visit (HOSPITAL_COMMUNITY): Payer: 59

## 2019-06-05 ENCOUNTER — Encounter: Payer: Self-pay | Admitting: Internal Medicine

## 2019-08-01 ENCOUNTER — Ambulatory Visit: Payer: 59 | Attending: Internal Medicine

## 2019-08-01 DIAGNOSIS — Z23 Encounter for immunization: Secondary | ICD-10-CM

## 2019-08-01 NOTE — Progress Notes (Signed)
   Covid-19 Vaccination Clinic  Name:  Marie Adams    MRN: [id number] DOB: 1971/03/25  08/01/2019  Ms. Mancuso was observed post Covid-19 immunization for 15 minutes without incident. She was provided with Vaccine Information Sheet and instruction to access the V-Safe system.   Ms. Riesen was instructed to call 911 with any severe reactions post vaccine: Marland Kitchen Difficulty breathing  . Swelling of face and throat  . A fast heartbeat  . A bad rash all over body  . Dizziness and weakness   Immunizations Administered    Name Date Dose VIS Date Route   Pfizer COVID-19 Vaccine 08/01/2019  4:13 PM 0.3 mL 04/25/2019 Intramuscular   Manufacturer: [place]   Lot: [id number]   [place]: [id number]

## 2019-08-27 ENCOUNTER — Ambulatory Visit: Payer: 59 | Attending: Internal Medicine

## 2019-08-27 DIAGNOSIS — Z23 Encounter for immunization: Secondary | ICD-10-CM

## 2019-08-27 NOTE — Progress Notes (Signed)
   Covid-19 Vaccination Clinic  Name:  Marie Adams    MRN: [id number] DOB: 1970-11-12  08/27/2019  [name] was observed post Covid-19 immunization for 15 minutes without incident. She was provided with Vaccine Information Sheet and instruction to access the V-Safe system.   [name] was instructed to call 911 with any severe reactions post vaccine: Marland Kitchen Difficulty breathing  . Swelling of face and throat  . A fast heartbeat  . A bad rash all over body  . Dizziness and weakness   Immunizations Administered    Name Date Dose VIS Date Route   Pfizer COVID-19 Vaccine 08/27/2019  3:55 PM 0.3 mL 04/25/2019 Intramuscular   Manufacturer: [place]   Lot: [id number]   [place]: [id number]

## 2019-09-03 ENCOUNTER — Encounter: Payer: Self-pay | Admitting: Internal Medicine

## 2019-09-29 ENCOUNTER — Ambulatory Visit (HOSPITAL_COMMUNITY): Payer: 59

## 2019-10-02 ENCOUNTER — Encounter (HOSPITAL_COMMUNITY)
Admission: RE | Admit: 2019-10-02 | Discharge: 2019-10-02 | Disposition: A | Discharge status: Home / Self Care | Payer: 59 | Source: Ambulatory Visit | Attending: Internal Medicine | Admitting: Internal Medicine

## 2019-10-02 ENCOUNTER — Other Ambulatory Visit: Payer: Self-pay

## 2019-10-02 ENCOUNTER — Encounter: Payer: Self-pay | Admitting: Internal Medicine

## 2019-10-02 DIAGNOSIS — R1011 Right upper quadrant pain: Secondary | ICD-10-CM

## 2019-10-02 IMAGING — NM NM HEPATO W/GB/PHARM/[PERSON_NAME]
2 series · 12 of 12 positions shown · non-contrast
Comparison: None

CLINICAL DATA: RIGHT upper quadrant pain extending to back,
intermittent episodes for 2 years especially after fatty/greasy
foods

EXAM:
NUCLEAR MEDICINE HEPATOBILIARY IMAGING WITH GALLBLADDER EF
TECHNIQUE: Sequential images of the abdomen were obtained [DATE] minutes
following intravenous administration of radiopharmaceutical. After
oral ingestion of Ensure, gallbladder ejection fraction was
determined. At 60 min, normal ejection fraction is greater than 33%.
RADIOPHARMACEUTICALS:  5.1 mCi [YM]  Choletec IV

[Series 1: biliary · 3.25mm/px · 6 of 60 frames shown]
[frame 6/60]
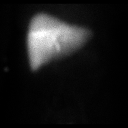
[frame 16/60]
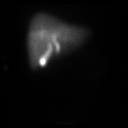
[frame 26/60]
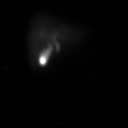
[frame 36/60]
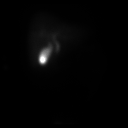
[frame 46/60]
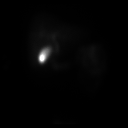
[frame 56/60]
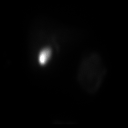

[Series 2: gbef · 3.25mm/px · 6 of 60 frames shown]
[frame 6/60]
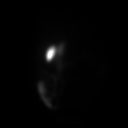
[frame 16/60]
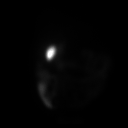
[frame 26/60]
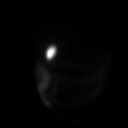
[frame 36/60]
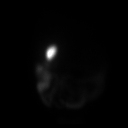
[frame 46/60]
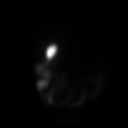
[frame 56/60]
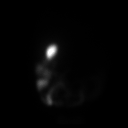

[12 of 12 positions shown; findings below may reference images not displayed]

FINDINGS: Normal tracer extraction from bloodstream indicating normal
hepatocellular function.

Normal excretion of tracer into biliary tree.

Gallbladder visualized at 11 min.

Small bowel visualized at 39 min.

No hepatic retention of tracer.

Subjectively poor emptying of tracer from gallbladder following
fatty meal stimulation.

Calculated gallbladder ejection fraction is 6%, abnormally low.

Patient reported no symptoms following Ensure ingestion.

Normal gallbladder ejection fraction following Ensure ingestion is
greater than 33% at 1 hour.
IMPRESSION: Patent biliary tree.

Abnormal gallbladder response to fatty meal stimulation with a
markedly decreased ejection fraction of 6%.

## 2019-10-02 MED ORDER — TECHNETIUM TC 99M MEBROFENIN IV KIT
5.1000 | PACK | Freq: Once | INTRAVENOUS | Status: AC | PRN
  Administered 2019-10-02: 5.1 via INTRAVENOUS

## 2020-01-07 ENCOUNTER — Other Ambulatory Visit: Payer: Self-pay | Admitting: Obstetrics and Gynecology

## 2020-01-07 DIAGNOSIS — N6320 Unspecified lump in the left breast, unspecified quadrant: Secondary | ICD-10-CM

## 2020-01-08 ENCOUNTER — Encounter: Payer: Self-pay | Admitting: Internal Medicine

## 2020-01-08 DIAGNOSIS — Z1211 Encounter for screening for malignant neoplasm of colon: Secondary | ICD-10-CM

## 2020-01-09 ENCOUNTER — Encounter: Payer: Self-pay | Admitting: Gastroenterology

## 2020-01-27 ENCOUNTER — Other Ambulatory Visit: Payer: 59

## 2020-01-29 ENCOUNTER — Encounter: Payer: 59 | Admitting: Gastroenterology

## 2020-01-29 ENCOUNTER — Ambulatory Visit (AMBULATORY_SURGERY_CENTER): Payer: Self-pay | Admitting: *Deleted

## 2020-01-29 ENCOUNTER — Other Ambulatory Visit: Payer: Self-pay

## 2020-01-29 VITALS — Ht 65.0 in | Wt 167.0 lb

## 2020-01-29 DIAGNOSIS — Z1211 Encounter for screening for malignant neoplasm of colon: Secondary | ICD-10-CM

## 2020-01-29 NOTE — Progress Notes (Signed)
Patient is here in-person for PV. Patient denies any allergies to eggs or soy. Patient denies any problems with anesthesia/sedation. Patient denies any oxygen use at home. Patient denies taking any diet/weight loss medications or blood thinners. Patient is not being treated for MRSA or C-diff. Patient is aware of our care-partner policy and ETKKO-46 safety protocol. EMMI education assisgned to the patient for the procedure, sent to MyChart.   COVID-19 vaccines completed on 08/27/19, per patient.

## 2020-02-04 ENCOUNTER — Ambulatory Visit
Admission: RE | Admit: 2020-02-04 | Discharge: 2020-02-04 | Disposition: A | Discharge status: Home / Self Care | Payer: 59 | Source: Ambulatory Visit | Attending: Obstetrics and Gynecology | Admitting: Obstetrics and Gynecology

## 2020-02-04 ENCOUNTER — Other Ambulatory Visit: Payer: Self-pay

## 2020-02-04 DIAGNOSIS — N6320 Unspecified lump in the left breast, unspecified quadrant: Secondary | ICD-10-CM

## 2020-02-04 IMAGING — US US BREAST*L* LIMITED INC AXILLA
1 series · 9 of 9 positions shown · non-contrast
Comparison: Previous exam(s).

CLINICAL DATA: Short-term follow-up for probably benign left breast
masses, which were initially assessed in [DATE].

EXAM:
DIGITAL DIAGNOSTIC BILATERAL MAMMOGRAM WITH CAD AND TOMO
ULTRASOUND LEFT BREAST

[Series 1: us breast*left* limited inc axilla · 0.06mm/px · 9 of 9 slices shown]
[im 1/9]
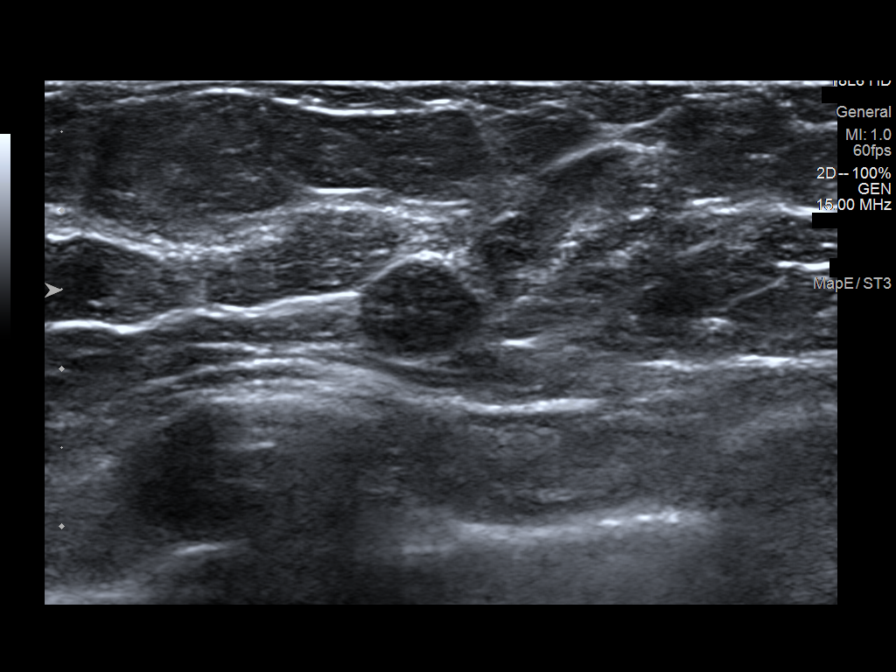
[im 2/9]
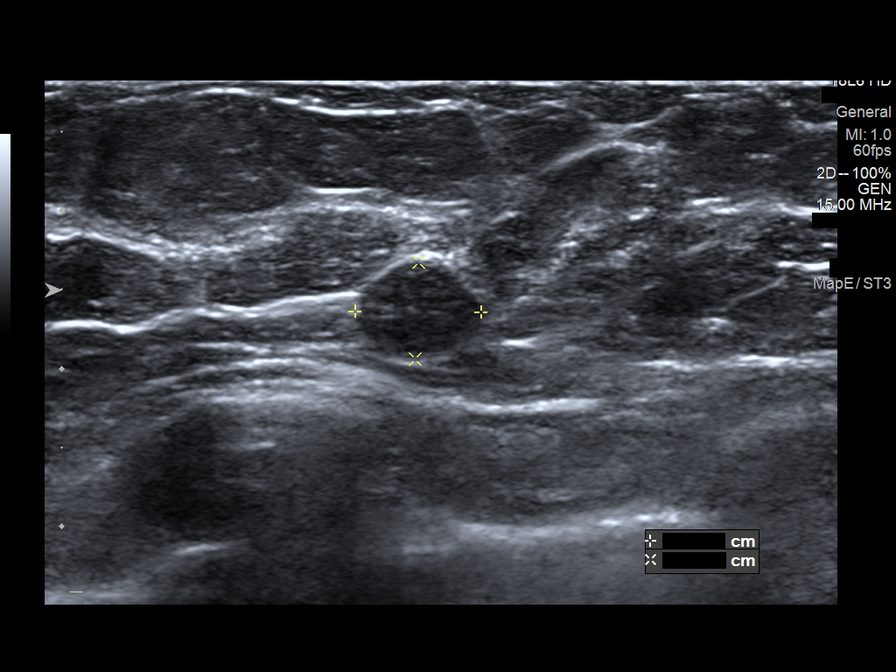
[im 3/9]
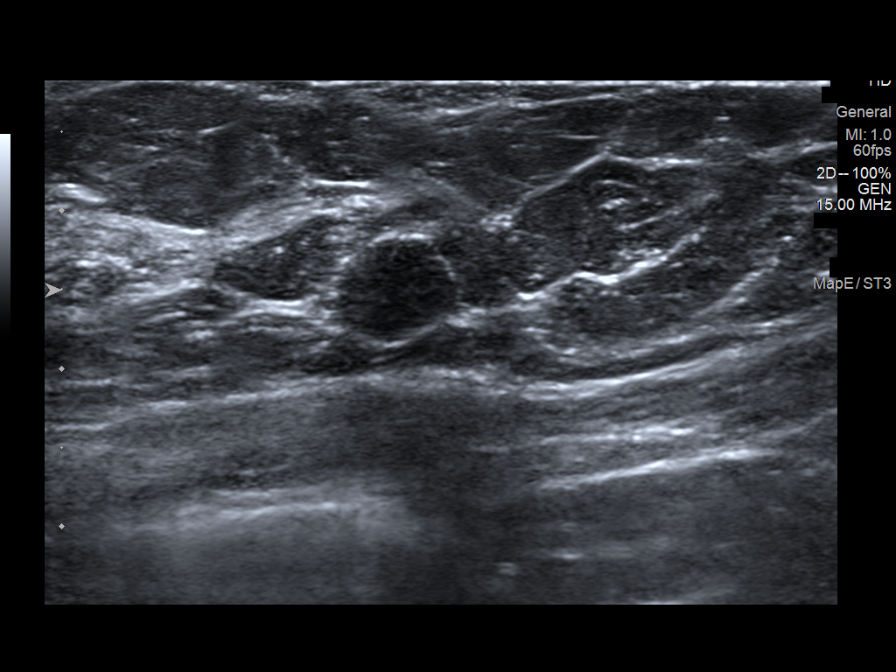
[im 4/9]
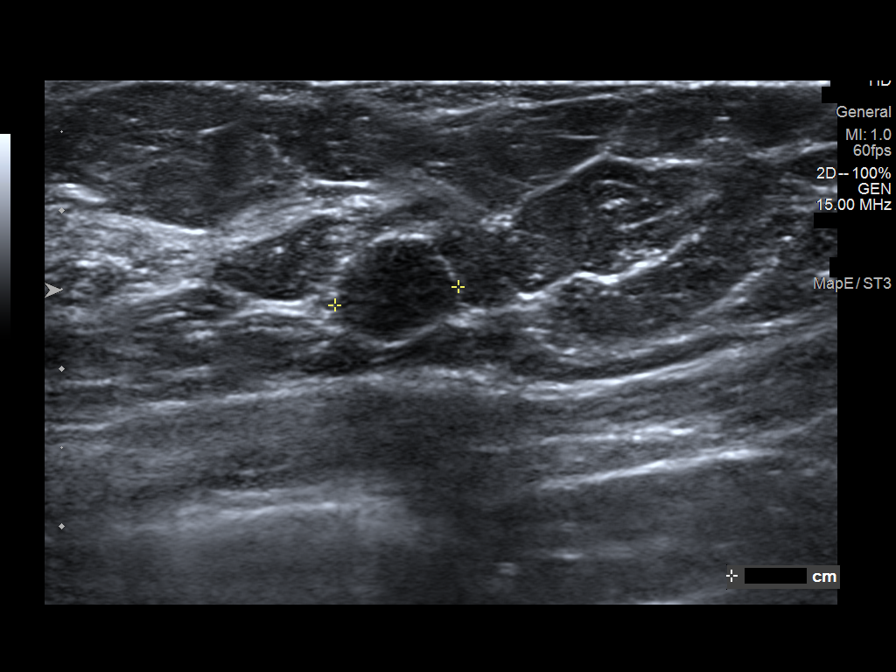
[im 5/9]
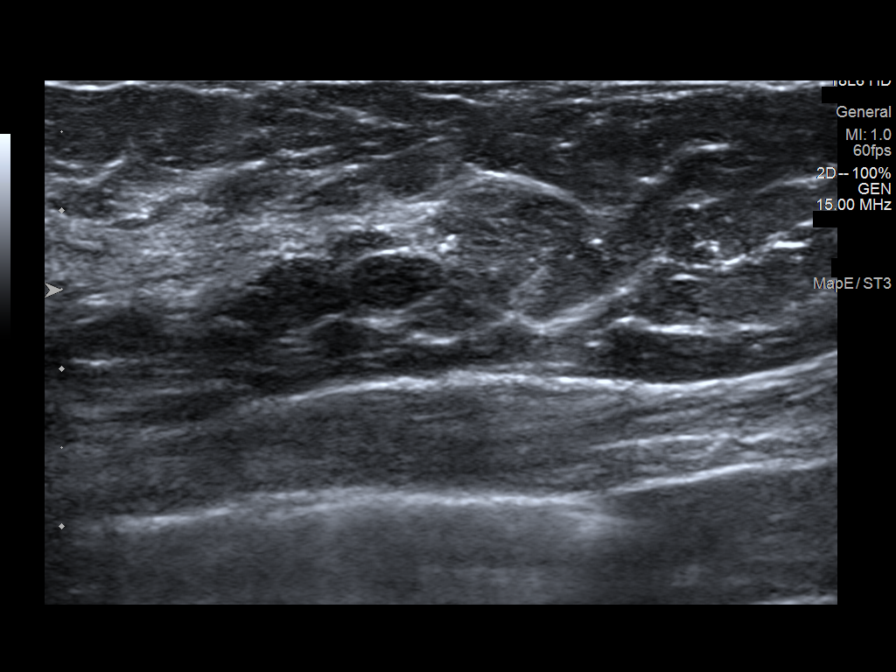
[im 6/9]
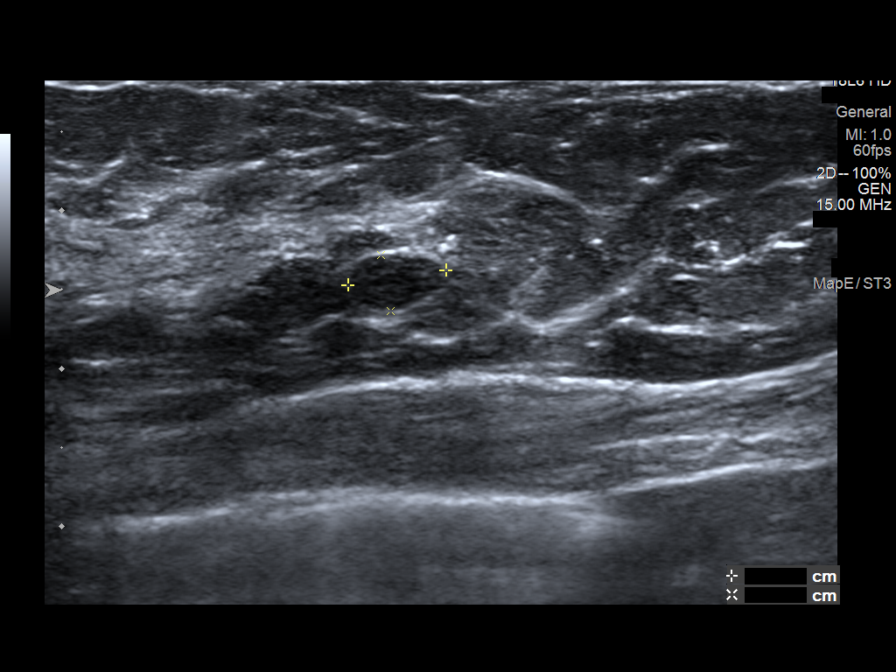
[im 7/9]
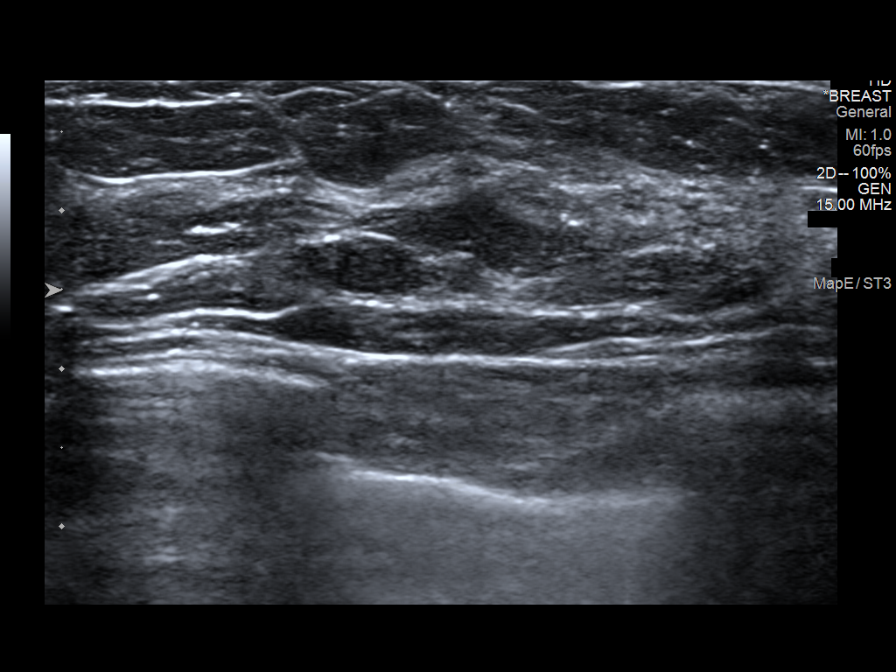
[im 8/9]
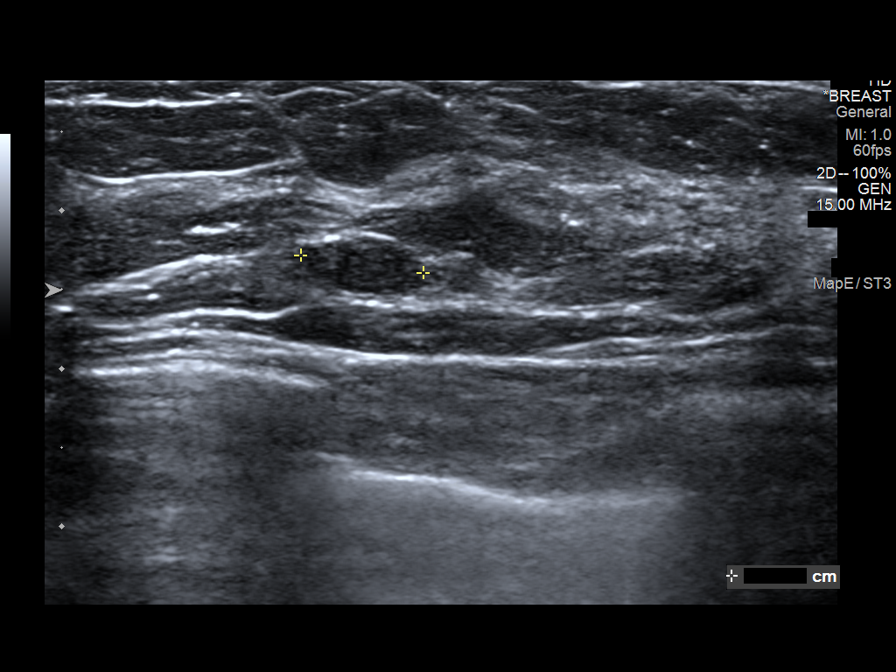
[im 9/9]
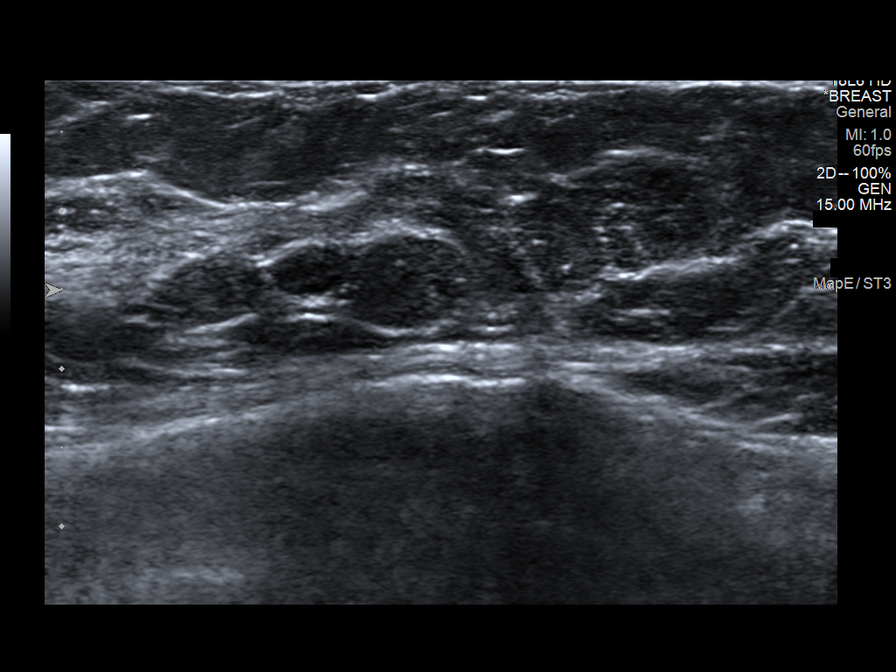

[9 of 9 positions shown; findings below may reference images not displayed]

Is

ACR Breast Density Category c: The breast tissue is heterogeneously
dense, which may obscure small masses.
FINDINGS: The mass/contiguous masses, noted in the inferior left breast on the
prior exams is without mammographic change.

There are no new or suspicious masses, no areas of architectural
distortion and no suspicious calcifications.

Mammographic images were processed with CAD.

Targeted ultrasound is performed, showing 2 adjacent masses, both
hypoechoic, oval and circumscribed, the larger measuring 8 x 6 x 8
mm and the smaller measuring 8 x 4 x 6 mm. Both lie at 7 o'clock, 5
cm from the nipple, and are without significant change compared to
the prior exams allowing for differences in measurement technique.
IMPRESSION: 1. No evidence of breast malignancy.
2. Two adjacent benign masses, consistent with fibroadenomas, in the
7 o'clock position of the left breast, stable for 2 years.

RECOMMENDATION:
Screening mammogram in one year.(Code:[MI])

I have discussed the findings and recommendations with the patient.
If applicable, a reminder letter will be sent to the patient
regarding the next appointment.

BI-RADS CATEGORY  2: Benign.

## 2020-02-04 IMAGING — MG DIGITAL DIAGNOSTIC BILAT W/ TOMO W/ CAD
8 series · 8 of 24 positions shown · non-contrast
Comparison: Previous exam(s).

CLINICAL DATA: Short-term follow-up for probably benign left breast
masses, which were initially assessed in [DATE].

EXAM:
DIGITAL DIAGNOSTIC BILATERAL MAMMOGRAM WITH CAD AND TOMO
ULTRASOUND LEFT BREAST

[L MLO synth-2D]
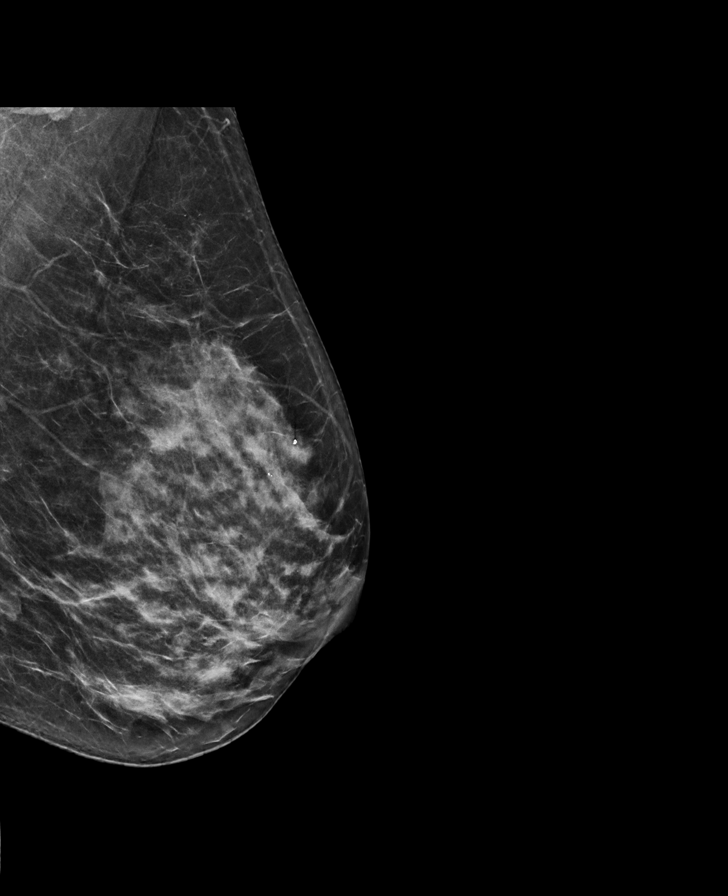

[L CC synth-2D]
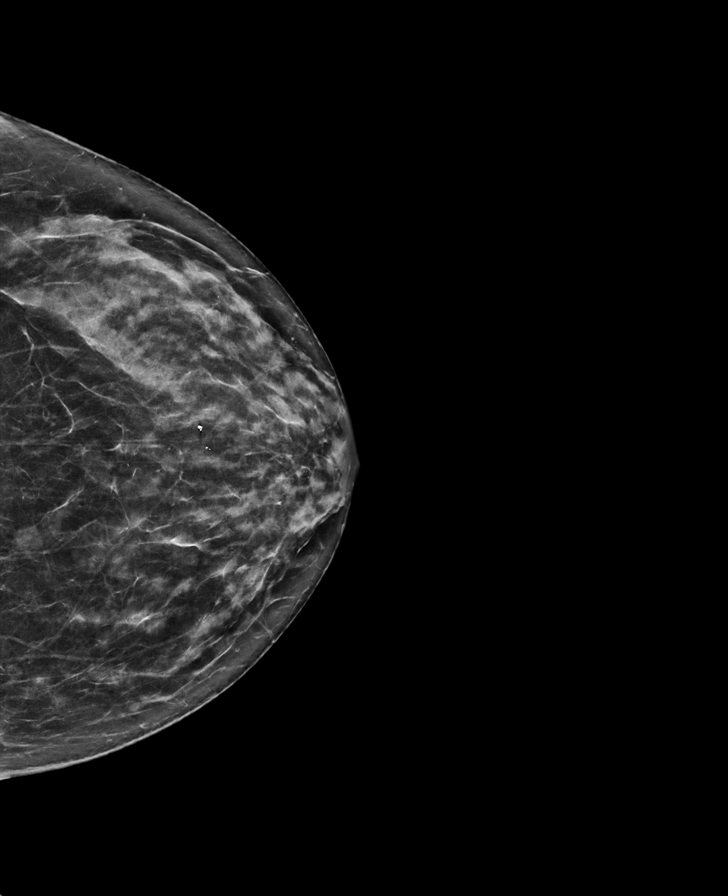

[R MLO synth-2D]
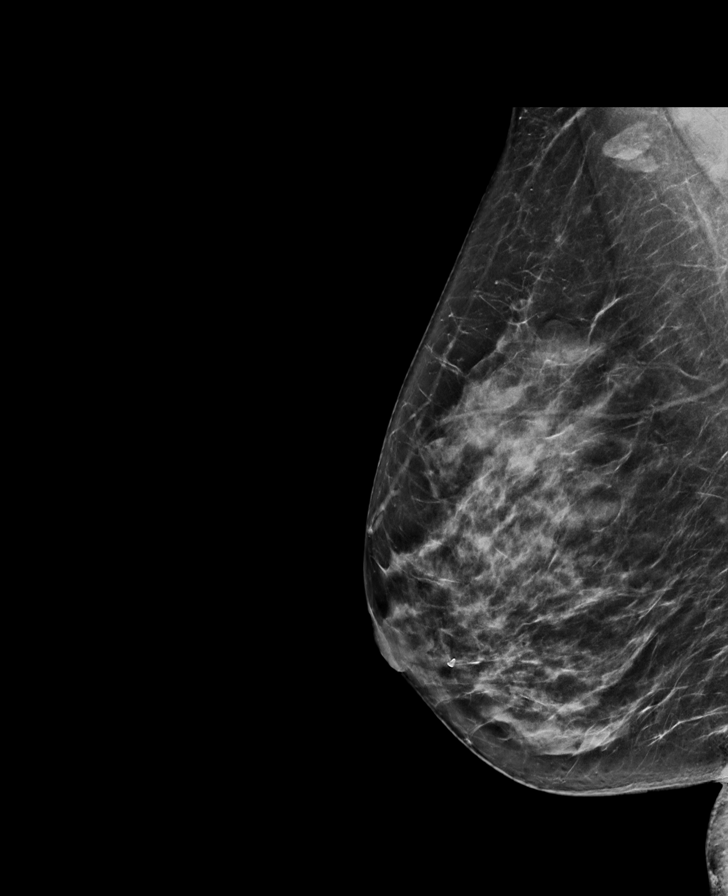

[R CC synth-2D]
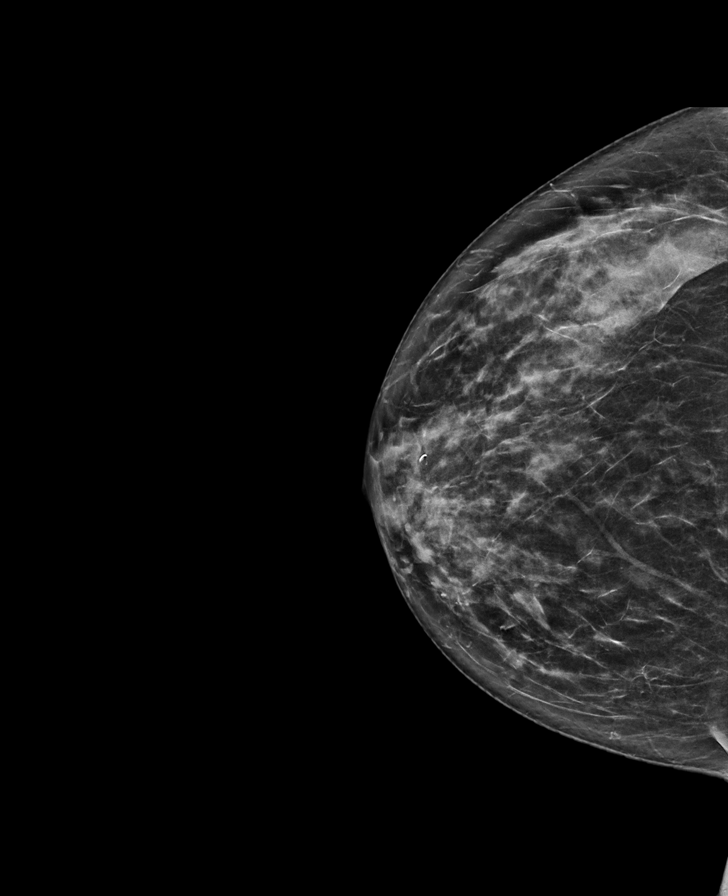

[L MLO tomo · tomo slice 35/69.0]
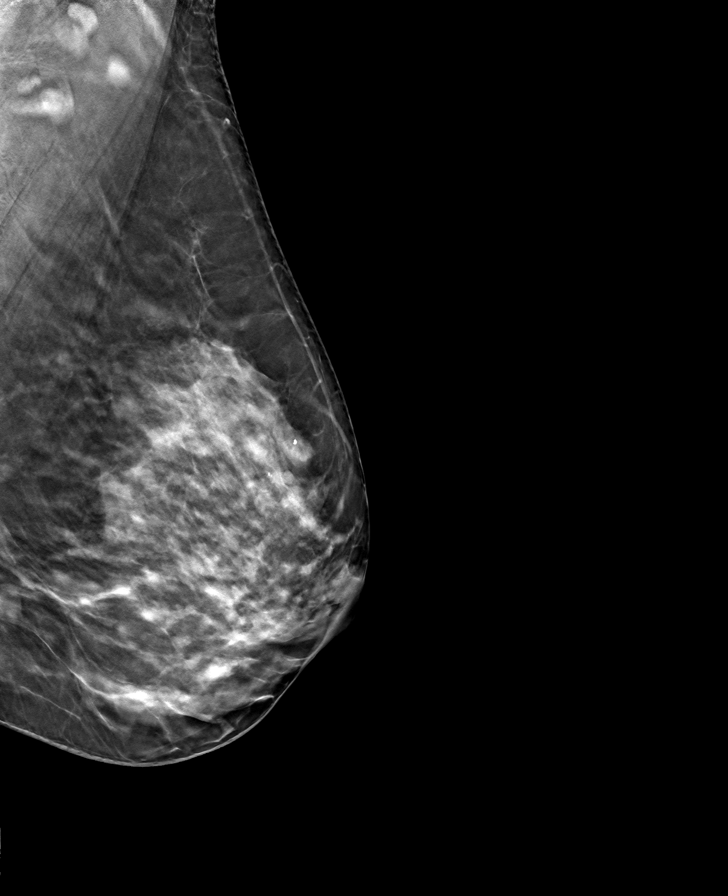

[R CC tomo · tomo slice 35/70.0]
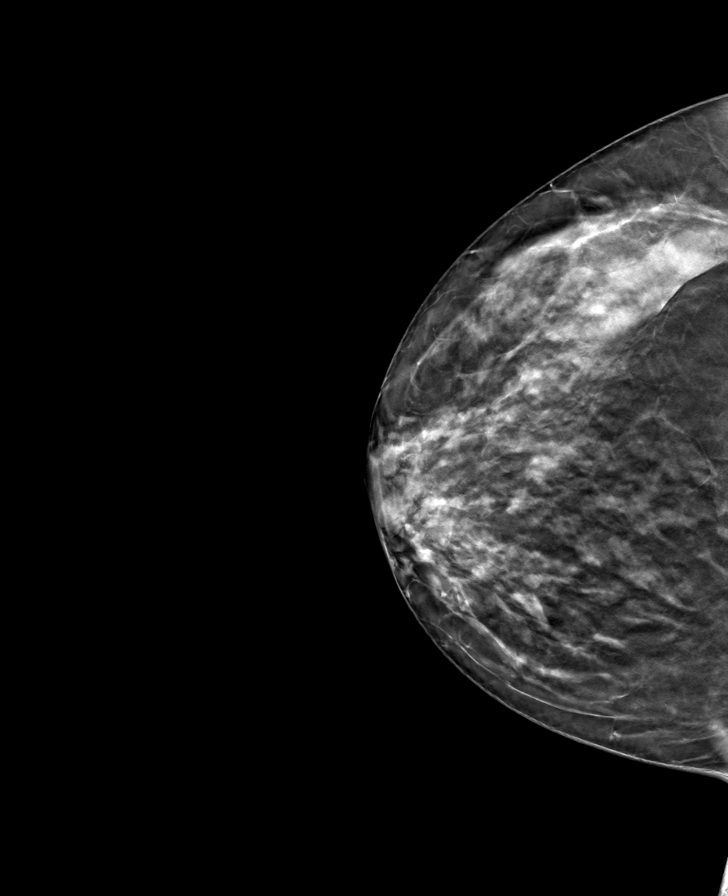

[L CC tomo · tomo slice 33/66.0]
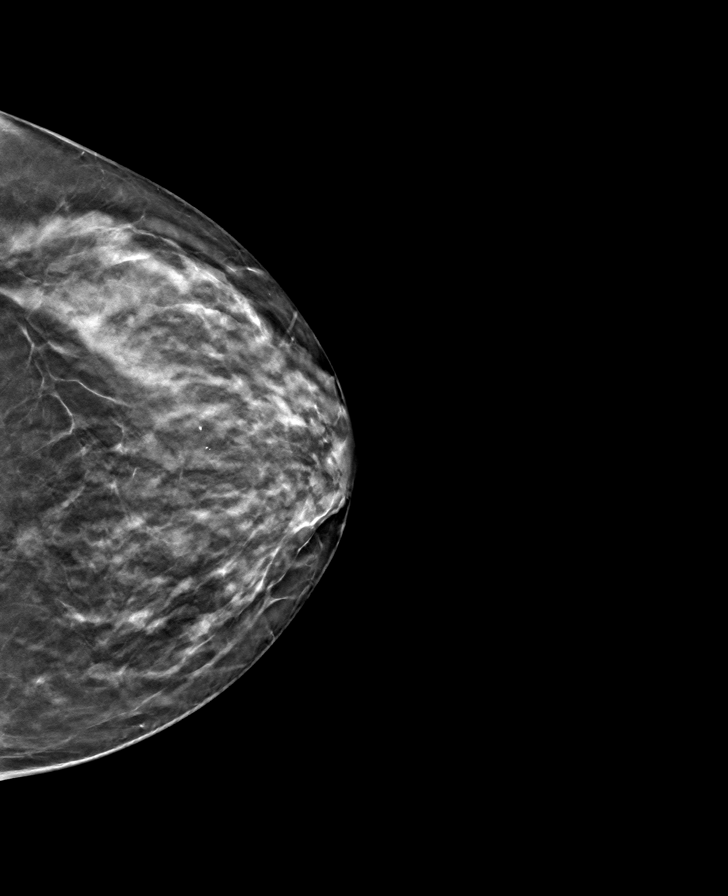

[R MLO tomo · tomo slice 41/80.0]
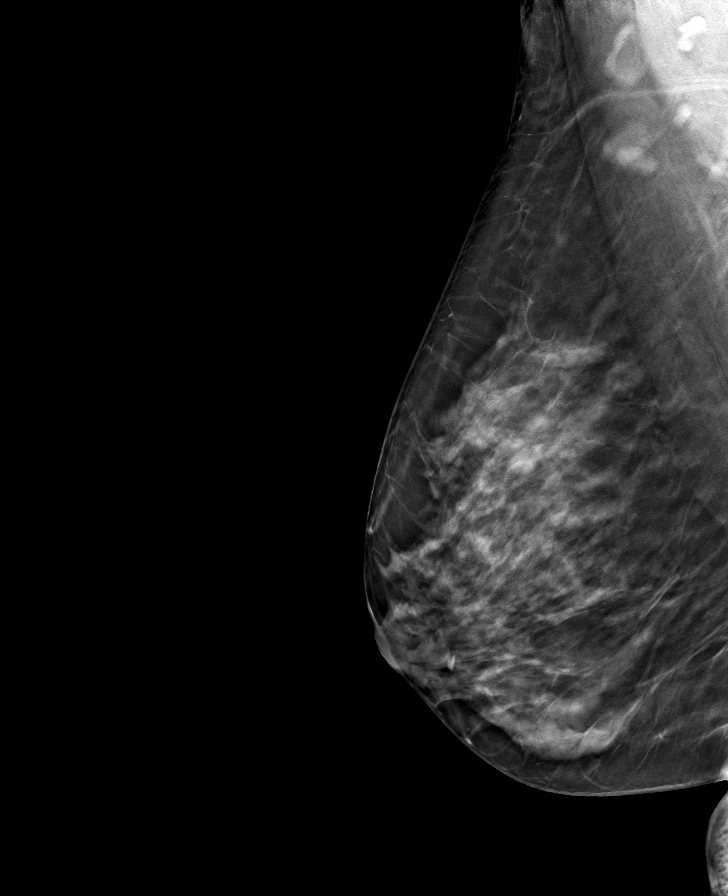

[8 of 24 positions shown; findings below may reference images not displayed]

Is

ACR Breast Density Category c: The breast tissue is heterogeneously
dense, which may obscure small masses.
FINDINGS: The mass/contiguous masses, noted in the inferior left breast on the
prior exams is without mammographic change.

There are no new or suspicious masses, no areas of architectural
distortion and no suspicious calcifications.

Mammographic images were processed with CAD.

Targeted ultrasound is performed, showing 2 adjacent masses, both
hypoechoic, oval and circumscribed, the larger measuring 8 x 6 x 8
mm and the smaller measuring 8 x 4 x 6 mm. Both lie at 7 o'clock, 5
cm from the nipple, and are without significant change compared to
the prior exams allowing for differences in measurement technique.
IMPRESSION: 1. No evidence of breast malignancy.
2. Two adjacent benign masses, consistent with fibroadenomas, in the
7 o'clock position of the left breast, stable for 2 years.

RECOMMENDATION:
Screening mammogram in one year.(Code:[MI])

I have discussed the findings and recommendations with the patient.
If applicable, a reminder letter will be sent to the patient
regarding the next appointment.

BI-RADS CATEGORY  2: Benign.

## 2020-02-12 ENCOUNTER — Ambulatory Visit (AMBULATORY_SURGERY_CENTER): Payer: 59 | Admitting: Gastroenterology

## 2020-02-12 ENCOUNTER — Encounter: Payer: Self-pay | Admitting: Gastroenterology

## 2020-02-12 ENCOUNTER — Other Ambulatory Visit: Payer: Self-pay

## 2020-02-12 VITALS — BP 124/86 | HR 85 | Temp 97.8°F | Resp 14

## 2020-02-12 DIAGNOSIS — K635 Polyp of colon: Secondary | ICD-10-CM

## 2020-02-12 DIAGNOSIS — Z1211 Encounter for screening for malignant neoplasm of colon: Secondary | ICD-10-CM

## 2020-02-12 DIAGNOSIS — K6389 Other specified diseases of intestine: Secondary | ICD-10-CM | POA: Diagnosis not present

## 2020-02-12 DIAGNOSIS — D125 Benign neoplasm of sigmoid colon: Secondary | ICD-10-CM

## 2020-02-12 MED ORDER — METRONIDAZOLE 500 MG PO TABS: 500.0000 mg | ORAL_TABLET | Freq: Two times a day (BID) | ORAL | 0 refills | Status: DC

## 2020-02-12 MED ORDER — CIPROFLOXACIN HCL 500 MG PO TABS: 500.0000 mg | ORAL_TABLET | Freq: Two times a day (BID) | ORAL | 0 refills | Status: DC

## 2020-02-12 MED ORDER — SODIUM CHLORIDE 0.9 % IV SOLN: 500.0000 mL | Freq: Once | INTRAVENOUS | Status: DC

## 2020-02-12 NOTE — Op Note (Signed)
[facility] Patient Name: Marie Adams Procedure Date: 02/12/2020 2:41 PM MRN: [id number] Endoscopist: Justice Britain , MD Age: 49 Referring MD:  Date of Birth: 16-Jan-1971 Gender: Female Account #: [id number] Procedure:                Colonoscopy Indications:              Screening for colorectal malignant neoplasm Medicines:                Monitored Anesthesia Care Procedure:                Pre-Anesthesia Assessment:                           - Prior to the procedure, a History and Physical                            was performed, and patient medications and                            allergies were reviewed. The patient's tolerance of                            previous anesthesia was also reviewed. The risks                            and benefits of the procedure and the sedation                            options and risks were discussed with the patient.                            All questions were answered, and informed consent                            was obtained. Prior Anticoagulants: The patient has                            taken no previous anticoagulant or antiplatelet                            agents. ASA Grade Assessment: II - A patient with                            mild systemic disease. After reviewing the risks                            and benefits, the patient was deemed in                            satisfactory condition to undergo the procedure.                           After obtaining informed consent, the colonoscope  was passed under direct vision. Throughout the                            procedure, the patient's blood pressure, pulse, and                            oxygen saturations were monitored continuously. The                            Colonoscope was introduced through the anus and                            advanced to the the cecum, identified by                            appendiceal orifice and  ileocecal valve. The                            colonoscopy was performed without difficulty. The                            patient tolerated the procedure. The quality of the                            bowel preparation was adequate. The ileocecal                            valve, appendiceal orifice, and rectum were                            photographed. Scope In: 2:46:51 PM Scope Out: 3:19:52 PM Scope Withdrawal Time: 0 hours 27 minutes 54 seconds  Total Procedure Duration: 0 hours 33 minutes 1 second  Findings:                 Skin tags were found on perianal exam.                           The digital rectal exam findings include                            hemorrhoids. Pertinent negatives include no                            palpable rectal lesions.                           The colon (entire examined portion) revealed                            significantly excessive looping.                           Many small and large-mouthed diverticula were found  in the recto-sigmoid colon, sigmoid colon,                            descending colon and transverse colon.                           A 3 mm polyp was found in the sigmoid colon                            entirely within a diverticulum. The polyp was                            sessile. NBI imaging suggested this to be more                            likely adenomatous rather than hyperplastic based                            on a differentiation in the pit pattern.                            Preparations were made for mucosal resection. NBI                            imaging and White-light imaging was done to                            demarcate the borders of the lesion. Saline was                            injected to raise the lesion out of the                            diverticulum. Cold snare mucosal resection was                            performed. Resection and retrieval were complete.                            Normal mucosa was found in the entire colon                            otherwise.                           Non-bleeding non-thrombosed internal and external                            hemorrhoids were found during retroflexion, during                            perianal exam and during digital exam. Complications:            No immediate complications. Estimated Blood Loss:     Estimated blood loss was  minimal. Impression:               - Perianal skin tags found on perianal exam.                           - Hemorrhoids found on digital rectal exam.                           - There was significant looping of the colon.                           - Diverticulosis in the recto-sigmoid colon, in the                            sigmoid colon, in the descending colon and in the                            transverse colon.                           - One 3 mm polyp in the sigmoid colon found within                            a diverticulum entirely, removed with mucosal cold                            resection. Resected and retrieved.                           - Normal mucosa in the entire examined colon                            otherwise.                           - Non-bleeding non-thrombosed internal and external                            hemorrhoids. Recommendation:           - The patient will be observed post-procedure,                            until all discharge criteria are met.                           - Discharge patient to home.                           - Patient has a contact number available for                            emergencies. The signs and symptoms of potential                            delayed complications were discussed with the  patient. Return to normal activities tomorrow.                            Written discharge instructions were provided to the                            patient.                            - High fiber diet.                           - Use FiberCon 1-2 tablets PO daily.                           - Start Ciprofloxacin 500 mg twice daily + Flagyl                            500 mg twice daily. Total of 5-days of antibiotics                            in effort of trying to decrease chance of                            diverticulitis development since we worked on the                            tissue within the diverticulum.                           - Follow up pathology findings.                           - Follow up colonoscopy to be determined based on                            findings on pathology - likely 7 v 10 years.                           - The findings and recommendations were discussed                            with the patient.                           - The findings and recommendations were discussed                            with the patient's family. Justice Britain, MD 02/12/2020 3:32:03 PM

## 2020-02-12 NOTE — Progress Notes (Signed)
Report given to PACU, vss 

## 2020-02-12 NOTE — Patient Instructions (Signed)
Handouts on polyps, hemorrhoids, and diverticulosis given to you today  Await pathology results  High fiber diet recommended  Start antibiotics    YOU HAD AN ENDOSCOPIC PROCEDURE TODAY AT [facility]:   Refer to the procedure report that was given to you for any specific questions about what was found during the examination.  If the procedure report does not answer your questions, please call your gastroenterologist to clarify.  If you requested that your care partner not be given the details of your procedure findings, then the procedure report has been included in a sealed envelope for you to review at your convenience later.  YOU SHOULD EXPECT: Some feelings of bloating in the abdomen. Passage of more gas than usual.  Walking can help get rid of the air that was put into your GI tract during the procedure and reduce the bloating. If you had a lower endoscopy (such as a colonoscopy or flexible sigmoidoscopy) you may notice spotting of blood in your stool or on the toilet paper. If you underwent a bowel prep for your procedure, you may not have a normal bowel movement for a few days.  Please Note:  You might notice some irritation and congestion in your nose or some drainage.  This is from the oxygen used during your procedure.  There is no need for concern and it should clear up in a day or so.  SYMPTOMS TO REPORT IMMEDIATELY:   Following lower endoscopy (colonoscopy or flexible sigmoidoscopy):  Excessive amounts of blood in the stool  Significant tenderness or worsening of abdominal pains  Swelling of the abdomen that is new, acute  Fever of 100F or higher  For urgent or emergent issues, a gastroenterologist can be reached at any hour by calling [phone]. Do not use MyChart messaging for urgent concerns.    DIET:  We do recommend a small meal at first, but then you may proceed to your regular diet.  Drink plenty of fluids but you should avoid alcoholic beverages  for 24 hours.  ACTIVITY:  You should plan to take it easy for the rest of today and you should NOT DRIVE or use heavy machinery until tomorrow (because of the sedation medicines used during the test).    FOLLOW UP: Our staff will call the number listed on your records 48-72 hours following your procedure to check on you and address any questions or concerns that you may have regarding the information given to you following your procedure. If we do not reach you, we will leave a message.  We will attempt to reach you two times.  During this call, we will ask if you have developed any symptoms of COVID 19. If you develop any symptoms (ie: fever, flu-like symptoms, shortness of breath, cough etc.) before then, please call [phone].  If you test positive for Covid 19 in the 2 weeks post procedure, please call and report this information to [place].    If any biopsies were taken you will be contacted by phone or by letter within the next 1-3 weeks.  Please call us at [phone] if you have not heard about the biopsies in 3 weeks.    SIGNATURES/CONFIDENTIALITY: You and/or your care partner have signed paperwork which will be entered into your electronic medical record.  These signatures attest to the fact that that the information above on your After Visit Summary has been reviewed and is understood.  Full responsibility of the confidentiality of this discharge information  lies with you and/or your care-partner.

## 2020-02-12 NOTE — Progress Notes (Signed)
Called to room to assist during endoscopic procedure.  Patient ID and intended procedure confirmed with present staff. Received instructions for my participation in the procedure from the performing physician.  

## 2020-02-12 NOTE — Progress Notes (Signed)
1453 HR > 100 with esmolol 25 mg given IV, MD updated, vss  

## 2020-02-16 ENCOUNTER — Telehealth: Payer: Self-pay | Admitting: *Deleted

## 2020-02-16 NOTE — Telephone Encounter (Signed)
  Follow up Call-  Call back number 02/12/2020  Post procedure Call Back phone  # [phone]  Permission to leave phone message Yes  Some recent data might be hidden     Patient questions:  Do you have a fever, pain , or abdominal swelling? No. Pain Score  0 *  Have you tolerated food without any problems? Yes.    Have you been able to return to your normal activities? Yes.    Do you have any questions about your discharge instructions: Diet   No. Medications  No. Follow up visit  No.  Do you have questions or concerns about your Care? No.  Actions: * If pain score is 4 or above: No action needed, pain <4.  1. Have you developed a fever since your procedure? no  2.   Have you had an respiratory symptoms (SOB or cough) since your procedure? no  3.   Have you tested positive for COVID 19 since your procedure no  4.   Have you had any family members/close contacts diagnosed with the COVID 19 since your procedure?  no   If yes to any of these questions please route to Joylene John, RN and Joella Prince, RN

## 2020-02-21 ENCOUNTER — Encounter: Payer: Self-pay | Admitting: Gastroenterology

## 2020-03-09 ENCOUNTER — Other Ambulatory Visit: Payer: Self-pay | Admitting: Internal Medicine

## 2020-03-18 ENCOUNTER — Encounter: Payer: Self-pay | Admitting: Internal Medicine

## 2020-03-18 DIAGNOSIS — I10 Essential (primary) hypertension: Secondary | ICD-10-CM

## 2020-03-21 MED ORDER — LISINOPRIL 5 MG PO TABS: 5.0000 mg | ORAL_TABLET | Freq: Every day | ORAL | 1 refills | Status: DC

## 2020-03-21 NOTE — Addendum Note (Signed)
Addended by: Binnie Rail on: 03/21/2020 02:54 PM   Modules accepted: Orders

## 2020-06-01 ENCOUNTER — Other Ambulatory Visit: Payer: Self-pay | Admitting: Internal Medicine

## 2020-06-23 ENCOUNTER — Encounter: Payer: Self-pay | Admitting: Internal Medicine

## 2020-07-01 ENCOUNTER — Other Ambulatory Visit (INDEPENDENT_AMBULATORY_CARE_PROVIDER_SITE_OTHER): Payer: 59

## 2020-07-01 DIAGNOSIS — I10 Essential (primary) hypertension: Secondary | ICD-10-CM

## 2020-07-01 LAB — BASIC METABOLIC PANEL
BUN: 8 mg/dL (ref 6–23)
CO2: 29 mEq/L (ref 19–32)
Calcium: 9.2 mg/dL (ref 8.4–10.5)
Chloride: 101 mEq/L (ref 96–112)
Creatinine, Ser: 0.74 mg/dL (ref 0.40–1.20)
GFR: 94.28 mL/min (ref >60.00)
Glucose, Bld: 100 mg/dL — ABNORMAL HIGH (ref 70–99)
Potassium: 3.7 mEq/L (ref 3.5–5.1)
Sodium: 135 mEq/L (ref 135–145)

## 2020-07-06 ENCOUNTER — Encounter: Payer: Self-pay | Admitting: Internal Medicine

## 2020-07-06 ENCOUNTER — Ambulatory Visit (INDEPENDENT_AMBULATORY_CARE_PROVIDER_SITE_OTHER): Payer: 59 | Admitting: Internal Medicine

## 2020-07-06 ENCOUNTER — Other Ambulatory Visit: Payer: Self-pay

## 2020-07-06 VITALS — BP 128/80 | HR 97 | Temp 98.2°F | Resp 18 | Ht 65.0 in | Wt 167.6 lb

## 2020-07-06 DIAGNOSIS — R350 Frequency of micturition: Secondary | ICD-10-CM

## 2020-07-06 LAB — URINALYSIS, ROUTINE W REFLEX MICROSCOPIC
Bilirubin Urine: NEGATIVE
Hgb urine dipstick: NEGATIVE
Ketones, ur: NEGATIVE
Leukocytes,Ua: NEGATIVE
Nitrite: NEGATIVE
RBC / HPF: NONE SEEN (ref 0–?)
Specific Gravity, Urine: 1.01 (ref 1.000–1.030)
Total Protein, Urine: NEGATIVE
Urine Glucose: NEGATIVE
Urobilinogen, UA: 0.2 (ref 0.0–1.0)
WBC, UA: NONE SEEN (ref 0–?)
pH: 7 (ref 5.0–8.0)

## 2020-07-06 MED ORDER — CIPROFLOXACIN HCL 500 MG PO TABS: 500.0000 mg | ORAL_TABLET | Freq: Two times a day (BID) | ORAL | 0 refills | Status: AC

## 2020-07-06 NOTE — Patient Instructions (Signed)
Please take all new medication as prescribed - the antibiotic  Please continue all other medications as before, and refills have been done if requested.  Please have the pharmacy call with any other refills you may need.  Please keep your appointments with your specialists as you may have planned  Please go to the LAB at the blood drawing area for the tests to be done - just the urine testing today  You will be contacted by phone if any changes need to be made immediately.  Otherwise, you will receive a letter about your results with an explanation, but please check with MyChart first.  Please remember to sign up for MyChart if you have not done so, as this will be important to you in the future with finding out test results, communicating by private email, and scheduling acute appointments online when needed.   

## 2020-07-06 NOTE — Progress Notes (Signed)
Patient ID: Marie Adams, female   DOB: 09-Feb-1971, 50 y.o.   MRN: [id number]        Chief Complaint: urinary freq       HPI:  Marie Adams is a 50 y.o. female here with c/o 2-3 days onset urinary frequency with lbp and nausea, but Denies urinary symptoms such as dysuria, urgency, flank pain, hematuria or n/v, fever, chills.  Pt denies chest pain, increased sob or doe, wheezing, orthopnea, PND, increased LE swelling, palpitations, dizziness or syncope.  Pt denies polydipsia, polyuria, Denies focal neuro s/s.  Wt Readings from Last 3 Encounters:  07/06/20 167 lb 9.6 oz (76 kg)  01/29/20 167 lb (75.8 kg)  05/05/19 160 lb 9.6 oz (72.8 kg)   BP Readings from Last 3 Encounters:  07/06/20 128/80  02/12/20 124/86  05/05/19 (!) 146/90         Past Medical History:  Diagnosis Date  . ANXIETY   . Heartburn    Past Surgical History:  Procedure Laterality Date  . BREAST EXCISIONAL BIOPSY Left   . BREAST SURGERY  1990   Breast biopsy - benign  . SIGMOIDOSCOPY  1990's    reports that she has never smoked. She has never used smokeless tobacco. She reports current alcohol use of about 9.0 standard drinks of alcohol per week. She reports that she does not use drugs. family history includes Arthritis in her maternal grandmother; Colon polyps in her mother; Diabetes in her father; Hyperlipidemia in her father; Hypertension in her father and mother; Transient ischemic attack in her paternal grandfather. Allergies  Allergen Reactions  . Promethazine Hcl Itching   Current Outpatient Medications on File Prior to Visit  Medication Sig Dispense Refill  . CALCIUM PO Take by mouth.    . Cyanocobalamin (VITAMIN B-12 PO) Take by mouth.    Marland Kitchen lisinopril (ZESTRIL) 5 MG tablet Take 1 tablet (5 mg total) by mouth daily. 90 tablet 1  . Multiple Vitamin (MULTIVITAMIN) tablet Take 1 tablet by mouth daily.    . Omega-3 Fatty Acids (FISH OIL PO) Take by mouth.    Marland Kitchen PARoxetine (PAXIL) 20 MG tablet TAKE 1  TABLET (20 MG TOTAL) BY MOUTH DAILY. APPT DUE FOR FOLLOW UP IN DECEMBER 90 tablet 1   No current facility-administered medications on file prior to visit.        ROS:  All others reviewed and negative.  Objective        PE:  BP 128/80   Pulse 97   Temp 98.2 F (36.8 C) (Oral)   Resp 18   Ht 5\' 5"  (1.651 m)   Wt 167 lb 9.6 oz (76 kg)   SpO2 98%   BMI 27.89 kg/m                 Constitutional: Pt appears in NAD               HENT: Head: NCAT.                Right Ear: External ear normal.                 Left Ear: External ear normal.                Eyes: . Pupils are equal, round, and reactive to light. Conjunctivae and EOM are normal               Nose: without d/c or deformity  Neck: Neck supple. Gross normal ROM               Cardiovascular: Normal rate and regular rhythm.                 Pulmonary/Chest: Effort normal and breath sounds without rales or wheezing.                Abd:  Soft, mild low mid abd tender, ND, + BS, no organomegaly               Neurological: Pt is alert. At baseline orientation, motor grossly intact               Skin: Skin is warm. No rashes, no other new lesions, LE edema - none               Psychiatric: Pt behavior is normal without agitation   Micro: none  Cardiac tracings I have personally interpreted today:  none  Pertinent Radiological findings (summarize): none   Lab Results  Component Value Date   WBC 8.1 01/31/2019   HGB 14.7 01/31/2019   HCT 43.7 01/31/2019   PLT 307.0 01/31/2019   GLUCOSE 100 (H) 07/01/2020   CHOL 227 (H) 01/31/2019   TRIG 106.0 01/31/2019   HDL 62.40 01/31/2019   LDLDIRECT 129.0 04/17/2013   LDLCALC 143 (H) 01/31/2019   ALT 15 01/31/2019   AST 16 01/31/2019   NA 135 07/01/2020   K 3.7 07/01/2020   CL 101 07/01/2020   CREATININE 0.74 07/01/2020   BUN 8 07/01/2020   CO2 29 07/01/2020   TSH 3.30 01/31/2019   Assessment/Plan:  Marie Adams is a 50 y.o. White or Caucasian [1] female  with  has a past medical history of ANXIETY and Heartburn.  Urinary frequency Mild to mod, for antibx course, urine studies,  to f/u any worsening symptoms or concerns  Followup: Return if symptoms worsen or fail to improve.  Cathlean Cower, MD 07/12/2020 11:08 PM [facility]

## 2020-07-07 ENCOUNTER — Encounter: Payer: Self-pay | Admitting: Internal Medicine

## 2020-07-07 LAB — URINE CULTURE: Result:: NO GROWTH

## 2020-07-09 ENCOUNTER — Encounter: Payer: Self-pay | Admitting: Internal Medicine

## 2020-07-12 ENCOUNTER — Encounter: Payer: Self-pay | Admitting: Internal Medicine

## 2020-07-12 DIAGNOSIS — R350 Frequency of micturition: Secondary | ICD-10-CM | POA: Insufficient documentation

## 2020-07-12 NOTE — Assessment & Plan Note (Signed)
Mild to mod, for antibx course, urine studies,  to f/u any worsening symptoms or concerns

## 2020-09-11 ENCOUNTER — Other Ambulatory Visit: Payer: Self-pay | Admitting: Internal Medicine

## 2020-10-05 ENCOUNTER — Other Ambulatory Visit: Payer: Self-pay | Admitting: Internal Medicine

## 2020-10-18 ENCOUNTER — Other Ambulatory Visit: Payer: Self-pay | Admitting: Internal Medicine

## 2020-10-18 NOTE — Telephone Encounter (Signed)
Error

## 2020-10-19 ENCOUNTER — Other Ambulatory Visit: Payer: Self-pay

## 2020-10-19 DIAGNOSIS — I10 Essential (primary) hypertension: Secondary | ICD-10-CM | POA: Insufficient documentation

## 2020-10-19 DIAGNOSIS — R739 Hyperglycemia, unspecified: Secondary | ICD-10-CM | POA: Insufficient documentation

## 2020-10-19 NOTE — Patient Instructions (Addendum)
  Blood work was ordered.     Medications changes include : meloxicam 15 mg daily x 2 weeks - take with food   Your prescription(s) have been submitted to your pharmacy. Please take as directed and contact our office if you believe you are having problem(s) with the medication(s).    Please followup in 1 year

## 2020-10-19 NOTE — Progress Notes (Signed)
Subjective:    Patient ID: Marie Adams, female    DOB: 09-Dec-1970, 50 y.o.   MRN: [id number]  HPI The patient is here for follow up of their chronic medical problems, including anxiety, htn  muscles on sides of hips sore at end of day.  No back pain.  Ibuprofen helps.  No leg pain or n/t.  Has taking advil on occasion and it helps    Medications and allergies reviewed with patient and updated if appropriate.  Patient Active Problem List   Diagnosis Date Noted  . Hypertension 10/19/2020  . Hyperglycemia 10/19/2020  . Urinary frequency 07/12/2020  . Ganglion cyst of left foot 05/05/2019  . Hyperlipidemia 01/22/2019  . Liver cyst 07/12/2017  . Abnormal ultrasound of kidney 07/12/2017  . Anemia 06/28/2017  . Right upper quadrant pain 06/28/2017  . GERD (gastroesophageal reflux disease) 06/28/2017  . Anxiety 03/02/2009    Current Outpatient Medications on File Prior to Visit  Medication Sig Dispense Refill  . CALCIUM PO Take by mouth.    . Cyanocobalamin (VITAMIN B-12 PO) Take by mouth.    Marland Kitchen lisinopril (ZESTRIL) 5 MG tablet TAKE 1 TABLET BY MOUTH EVERY DAY 30 tablet 0  . Multiple Vitamin (MULTIVITAMIN) tablet Take 1 tablet by mouth daily.    . Omega-3 Fatty Acids (FISH OIL PO) Take by mouth.    Marland Kitchen PARoxetine (PAXIL) 20 MG tablet TAKE 1 TABLET (20 MG TOTAL) BY MOUTH DAILY. APPT DUE FOR FOLLOW UP IN DECEMBER 90 tablet 1   No current facility-administered medications on file prior to visit.    Past Medical History:  Diagnosis Date  . ANXIETY   . Heartburn     Past Surgical History:  Procedure Laterality Date  . BREAST EXCISIONAL BIOPSY Left   . BREAST SURGERY  1990   Breast biopsy - benign  . SIGMOIDOSCOPY  1990's    Social History   Socioeconomic History  . Marital status: Married    Spouse name: Not on file  . Number of children: Not on file  . Years of education: Not on file  . Highest education level: Not on file  Occupational History  . Not on file   Tobacco Use  . Smoking status: Never Smoker  . Smokeless tobacco: Never Used  Vaping Use  . Vaping Use: Never used  Substance and Sexual Activity  . Alcohol use: Yes    Alcohol/week: 9.0 standard drinks    Types: 9 Glasses of wine per week    Comment: 1 glass of wine nightly  . Drug use: No  . Sexual activity: Not on file  Other Topics Concern  . Not on file  Social History Narrative   Lives with wife [name]) 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      Exercise: treadmill   Social Determinants of Health   Financial Resource Strain: Not on file  Food Insecurity: Not on file  Transportation Needs: Not on file  Physical Activity: Not on file  Stress: Not on file  Social Connections: Not on file    Family History  Problem Relation Age of Onset  . Arthritis Maternal Grandmother   . Diabetes Father        borderline  . Hyperlipidemia Father   . Hypertension Father   . Transient ischemic attack Paternal Grandfather   . Hypertension Mother   . Colon polyps Mother   . Colon cancer Neg Hx   . Esophageal cancer Neg Hx   . Stomach cancer Neg  Hx   . Rectal cancer Neg Hx     Review of Systems  Constitutional: Negative for chills and fever.  Respiratory: Negative for cough, shortness of breath and wheezing.   Cardiovascular: Negative for chest pain, palpitations and leg swelling.  Neurological: Negative for light-headedness and headaches.       Objective:   Vitals:   10/20/20 0958  BP: 122/84  Pulse: 77  Temp: 98.4 F (36.9 C)  SpO2: 98%   BP Readings from Last 3 Encounters:  10/20/20 122/84  07/06/20 128/80  02/12/20 124/86   Wt Readings from Last 3 Encounters:  10/20/20 167 lb (75.8 kg)  07/06/20 167 lb 9.6 oz (76 kg)  01/29/20 167 lb (75.8 kg)   Body mass index is 27.79 kg/m.   Physical Exam    Constitutional: Appears well-developed and well-nourished. No distress.  HENT:  Head: Normocephalic and atraumatic.  Neck: Neck supple. No tracheal deviation  present. No thyromegaly present.  No cervical lymphadenopathy Cardiovascular: Normal rate, regular rhythm and normal heart sounds.   No murmur heard. No carotid bruit .  No edema Pulmonary/Chest: Effort normal and breath sounds normal. No respiratory distress. No has no wheezes. No rales.  Msk: Mild tenderness with palpation superior aspect of iliac crest bilaterally, slight tenderness paravertebral muscles lumbar spine, no lumbar spine tenderness Skin: Skin is warm and dry. Not diaphoretic.  Psychiatric: Normal mood and affect. Behavior is normal.      Assessment & Plan:    See Problem List for Assessment and Plan of chronic medical problems.    This visit occurred during the SARS-CoV-2 public health emergency.  Safety protocols were in place, including screening questions prior to the visit, additional usage of staff PPE, and extensive cleaning of exam room while observing appropriate contact time as indicated for disinfecting solutions.

## 2020-10-20 ENCOUNTER — Other Ambulatory Visit: Payer: Self-pay

## 2020-10-20 ENCOUNTER — Encounter: Payer: Self-pay | Admitting: Internal Medicine

## 2020-10-20 ENCOUNTER — Ambulatory Visit (INDEPENDENT_AMBULATORY_CARE_PROVIDER_SITE_OTHER): Payer: BC Managed Care – PPO | Admitting: Internal Medicine

## 2020-10-20 VITALS — BP 122/84 | HR 77 | Temp 98.4°F | Ht 65.0 in | Wt 167.0 lb

## 2020-10-20 DIAGNOSIS — I10 Essential (primary) hypertension: Secondary | ICD-10-CM | POA: Diagnosis not present

## 2020-10-20 DIAGNOSIS — F419 Anxiety disorder, unspecified: Secondary | ICD-10-CM

## 2020-10-20 DIAGNOSIS — R739 Hyperglycemia, unspecified: Secondary | ICD-10-CM

## 2020-10-20 DIAGNOSIS — M898X8 Other specified disorders of bone, other site: Secondary | ICD-10-CM

## 2020-10-20 LAB — COMPREHENSIVE METABOLIC PANEL
ALT: 15 U/L (ref 0–35)
AST: 19 U/L (ref 0–37)
Albumin: 4.3 g/dL (ref 3.5–5.2)
Alkaline Phosphatase: 50 U/L (ref 39–117)
BUN: 14 mg/dL (ref 6–23)
CO2: 30 mEq/L (ref 19–32)
Calcium: 9.2 mg/dL (ref 8.4–10.5)
Chloride: 101 mEq/L (ref 96–112)
Creatinine, Ser: 0.71 mg/dL (ref 0.40–1.20)
GFR: 98.87 mL/min (ref >60.00)
Glucose, Bld: 86 mg/dL (ref 70–99)
Potassium: 4.1 mEq/L (ref 3.5–5.1)
Sodium: 138 mEq/L (ref 135–145)
Total Bilirubin: 0.6 mg/dL (ref 0.2–1.2)
Total Protein: 7.4 g/dL (ref 6.0–8.3)

## 2020-10-20 LAB — LIPID PANEL
Cholesterol: 196 mg/dL (ref 0–200)
HDL: 57.4 mg/dL (ref >39.00)
LDL Cholesterol: 118 mg/dL — ABNORMAL HIGH (ref 0–99)
NonHDL: 139.02
Total CHOL/HDL Ratio: 3
Triglycerides: 106 mg/dL (ref 0.0–149.0)
VLDL: 21.2 mg/dL (ref 0.0–40.0)

## 2020-10-20 LAB — CBC WITH DIFFERENTIAL/PLATELET
Basophils Absolute: 0.1 10*3/uL (ref 0.0–0.1)
Basophils Relative: 1.1 % (ref 0.0–3.0)
Eosinophils Absolute: 0.4 10*3/uL (ref 0.0–0.7)
Eosinophils Relative: 5.7 % — ABNORMAL HIGH (ref 0.0–5.0)
HCT: 39.7 % (ref 36.0–46.0)
Hemoglobin: 13.4 g/dL (ref 12.0–15.0)
Lymphocytes Relative: 24.5 % (ref 12.0–46.0)
Lymphs Abs: 1.8 10*3/uL (ref 0.7–4.0)
MCHC: 33.9 g/dL (ref 30.0–36.0)
MCV: 85.8 fl (ref 78.0–100.0)
Monocytes Absolute: 0.7 10*3/uL (ref 0.1–1.0)
Monocytes Relative: 9.4 % (ref 3.0–12.0)
Neutro Abs: 4.3 10*3/uL (ref 1.4–7.7)
Neutrophils Relative %: 59.3 % (ref 43.0–77.0)
Platelets: 349 10*3/uL (ref 150.0–400.0)
RBC: 4.63 Mil/uL (ref 3.87–5.11)
RDW: 13.3 % (ref 11.5–15.5)
WBC: 7.2 10*3/uL (ref 4.0–10.5)

## 2020-10-20 LAB — HEMOGLOBIN A1C: Hgb A1c MFr Bld: 5.3 % (ref 4.6–6.5)

## 2020-10-20 LAB — TSH: TSH: 1.98 u[IU]/mL (ref 0.35–4.50)

## 2020-10-20 MED ORDER — PAROXETINE HCL 20 MG PO TABS
20.0000 mg | ORAL_TABLET | Freq: Every day | ORAL | 3 refills | Status: DC
Start: 2020-10-20 — End: 2021-04-13

## 2020-10-20 MED ORDER — LISINOPRIL 5 MG PO TABS
5.0000 mg | ORAL_TABLET | Freq: Every day | ORAL | 3 refills | Status: DC
Start: 2020-10-20 — End: 2021-10-25

## 2020-10-20 MED ORDER — MELOXICAM 15 MG PO TABS: 15.0000 mg | ORAL_TABLET | Freq: Every day | ORAL | 0 refills | Status: DC

## 2020-10-20 NOTE — Assessment & Plan Note (Signed)
Chronic Blood pressure well controlled Continue lisinopril 5 mg daily CMP, CBC, TSH

## 2020-10-20 NOTE — Assessment & Plan Note (Signed)
Chronic Mildly elevated glucose at time We will check A1c She is exercising regularly, eating healthier and working on weight loss

## 2020-10-20 NOTE — Assessment & Plan Note (Signed)
Acute Bilateral tenderness on iliac crest ?  Related to lower back-she denies any lower back pain, but this could be referred pain ?  Compensation, need adjustment She has taken Advil on occasion and that does help, but has not taken it consistently-we will try meloxicam 15 mg daily for 2 weeks If pain persists would consider having her see sports medicine to see if something like an adjustment would help-possibly from lower back

## 2020-10-20 NOTE — Assessment & Plan Note (Signed)
Chronic Controlled, stable Continue paroxetine 20 mg daily

## 2020-11-23 ENCOUNTER — Encounter: Payer: Self-pay | Admitting: Internal Medicine

## 2021-01-26 ENCOUNTER — Encounter: Payer: Self-pay | Admitting: Internal Medicine

## 2021-03-21 DIAGNOSIS — Z6828 Body mass index (BMI) 28.0-28.9, adult: Secondary | ICD-10-CM | POA: Diagnosis not present

## 2021-03-21 DIAGNOSIS — Z01419 Encounter for gynecological examination (general) (routine) without abnormal findings: Secondary | ICD-10-CM | POA: Diagnosis not present

## 2021-03-22 ENCOUNTER — Other Ambulatory Visit: Payer: Self-pay | Admitting: Internal Medicine

## 2021-03-22 DIAGNOSIS — Z1231 Encounter for screening mammogram for malignant neoplasm of breast: Secondary | ICD-10-CM

## 2021-04-12 ENCOUNTER — Encounter: Payer: Self-pay | Admitting: Internal Medicine

## 2021-04-12 NOTE — Progress Notes (Signed)
Subjective:    Patient ID: Marie Adams, female    DOB: [birth year], 50 y.o.   MRN: [id number]  This visit occurred during the SARS-CoV-2 public health emergency.  Safety protocols were in place, including screening questions prior to the visit, additional usage of staff PPE, and extensive cleaning of exam room while observing appropriate contact time as indicated for disinfecting solutions.    HPI The patient is here for an acute visit.   Day after thanksgiving - sharp pain in RUQ and right posterior shoulder pain x 20-30 min.  Some diarrhea over the weekend.   Has been bloated, sore in RUQ.  Yesterday it was a continuous cramping her RUQ and just does not feel right.  She had some diarrhea yesterday and this morning.  She continues to have bloating.  States dec appetite.  No fever.   In May 2021 she had a decreased EF of her gallbladder noted.  She switched to a pescatarian diet and has not had any issues since then.  At Thanksgiving she months she was eating things she typically does not eat and was concerned maybe that caused her symptoms.  She was concerned that her symptoms were still lingering.   Medications and allergies reviewed with patient and updated if appropriate.  Patient Active Problem List   Diagnosis Date Noted   Abdominal pain 04/13/2021   Abdominal distension (gaseous) 04/13/2021   Iliac crest bone pain 10/20/2020   Hypertension 10/19/2020   Hyperglycemia 10/19/2020   Ganglion cyst of left foot 05/05/2019   Hyperlipidemia 01/22/2019   Liver cyst 07/12/2017   Abnormal ultrasound of kidney 07/12/2017   Anemia 06/28/2017   Right upper quadrant pain 06/28/2017   GERD (gastroesophageal reflux disease) 06/28/2017   Anxiety 03/02/2009    Current Outpatient Medications on File Prior to Visit  Medication Sig Dispense Refill   CALCIUM PO Take by mouth.     Cyanocobalamin (VITAMIN B-12 PO) Take by mouth.     lisinopril (ZESTRIL) 5 MG tablet Take 1 tablet (5  mg total) by mouth daily. 90 tablet 3   Multiple Vitamin (MULTIVITAMIN) tablet Take 1 tablet by mouth daily.     No current facility-administered medications on file prior to visit.    Past Medical History:  Diagnosis Date   ANXIETY    Heartburn     Past Surgical History:  Procedure Laterality Date   BREAST EXCISIONAL BIOPSY Left    BREAST SURGERY  1990   Breast biopsy - benign   SIGMOIDOSCOPY  1990's    Social History   Socioeconomic History   Marital status: Married    Spouse name: Not on file   Number of children: Not on file   Years of education: Not on file   Highest education level: Not on file  Occupational History   Not on file  Tobacco Use   Smoking status: Never   Smokeless tobacco: Never  Vaping Use   Vaping Use: Never used  Substance and Sexual Activity   Alcohol use: Yes    Alcohol/week: 9.0 standard drinks    Types: 9 Glasses of wine per week    Comment: 1 glass of wine nightly   Drug use: No   Sexual activity: Not on file  Other Topics Concern   Not on file  Social History Narrative   Lives with wife Marie Adams) 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      Exercise: treadmill   Social Determinants of Health   Financial Resource Strain:  Not on file  Food Insecurity: Not on file  Transportation Needs: Not on file  Physical Activity: Not on file  Stress: Not on file  Social Connections: Not on file    Family History  Problem Relation Age of Onset   Arthritis Maternal Grandmother    Diabetes Father        borderline   Hyperlipidemia Father    Hypertension Father    Transient ischemic attack Paternal Grandfather    Hypertension Mother    Colon polyps Mother    Colon cancer Neg Hx    Esophageal cancer Neg Hx    Stomach cancer Neg Hx    Rectal cancer Neg Hx     Review of Systems  Constitutional:  Positive for appetite change (dec). Negative for chills and fever.  Gastrointestinal:  Positive for abdominal distention (bloated), abdominal pain (RUQ)  and diarrhea. Negative for nausea and vomiting.       GERD intermittent - not new  Genitourinary:        No change in urine color      Objective:   Vitals:   04/13/21 0857  BP: 112/78  Pulse: 80  Temp: 98.1 F (36.7 C)  SpO2: 99%   BP Readings from Last 3 Encounters:  04/13/21 112/78  10/20/20 122/84  07/06/20 128/80   Wt Readings from Last 3 Encounters:  04/13/21 169 lb (76.7 kg)  10/20/20 167 lb (75.8 kg)  07/06/20 167 lb 9.6 oz (76 kg)   Body mass index is 28.12 kg/m.   Physical Exam Constitutional:      General: She is not in acute distress.    Appearance: She is well-developed. She is not ill-appearing.  HENT:     Head: Normocephalic and atraumatic.  Abdominal:     General: Abdomen is flat. There is no distension.     Palpations: Abdomen is soft.     Tenderness: There is abdominal tenderness in the right upper quadrant and right lower quadrant. There is guarding. Positive signs include Murphy's sign and McBurney's sign.  Neurological:     Mental Status: She is alert.       CT Abdomen Pelvis W Contrast CLINICAL DATA:  Abdominal pain and abdominal distension.  EXAM: CT ABDOMEN AND PELVIS WITH CONTRAST  TECHNIQUE: Multidetector CT imaging of the abdomen and pelvis was performed using the standard protocol following bolus administration of intravenous contrast.  CONTRAST:  173mL ISOVUE-300 IOPAMIDOL (ISOVUE-300) INJECTION 61%  COMPARISON:  Abdominal ultrasound 01/15/2018.  FINDINGS: Lower chest: No acute abnormality.  Hepatobiliary: There is a lobulated cyst in the left lobe of the liver measuring 2.3 x 2.8 cm which appear similar in size compared to prior ultrasound. The liver, gallbladder and bile ducts are otherwise within normal limits.  Pancreas: Unremarkable. No pancreatic ductal dilatation or surrounding inflammatory changes.  Spleen: Normal in size without focal abnormality.  Adrenals/Urinary Tract: There are some rounded hypodensities  in both kidneys which are too small to characterize, most likely cysts. Otherwise, the kidneys, adrenal glands and bladder are within normal limits.  Stomach/Bowel: Stomach is within normal limits. Appendix appears normal. No evidence of bowel wall thickening, distention, or inflammatory changes. There is sigmoid colon diverticulosis without evidence for diverticulitis.  Vascular/Lymphatic: No significant vascular findings are present. No enlarged abdominal or pelvic lymph nodes.  Reproductive: There is a rounded 1 cm lesion in the mid endometrium. Otherwise, the uterus and ovaries are within normal limits.  Other: No abdominal wall hernia or abnormality. No abdominopelvic ascites.  Musculoskeletal: No acute or significant osseous findings.  IMPRESSION: 1. 1 cm lesion in the endometrium, indeterminate. Recommend follow-up pelvic ultrasound for further evaluation. 2. Sigmoid colon diverticulosis without evidence for acute diverticulitis. 3. Unchanged hepatic complex cyst.  Electronically Signed   By: Ronney Asters M.D.   On: 04/13/2021 16:50     Assessment & Plan:      See Problem List for Assessment and Plan of chronic medical problems.     Call patient and discussed results

## 2021-04-13 ENCOUNTER — Ambulatory Visit (INDEPENDENT_AMBULATORY_CARE_PROVIDER_SITE_OTHER): Payer: BC Managed Care – PPO | Admitting: Internal Medicine

## 2021-04-13 ENCOUNTER — Other Ambulatory Visit: Payer: Self-pay

## 2021-04-13 ENCOUNTER — Other Ambulatory Visit: Payer: Self-pay | Admitting: Internal Medicine

## 2021-04-13 ENCOUNTER — Ambulatory Visit
Admission: RE | Admit: 2021-04-13 | Discharge: 2021-04-13 | Disposition: A | Discharge status: Home / Self Care | Payer: BC Managed Care – PPO | Source: Ambulatory Visit | Attending: Internal Medicine | Admitting: Internal Medicine

## 2021-04-13 VITALS — BP 112/78 | HR 80 | Temp 98.1°F | Ht 65.0 in | Wt 169.0 lb

## 2021-04-13 DIAGNOSIS — R140 Abdominal distension (gaseous): Secondary | ICD-10-CM | POA: Diagnosis not present

## 2021-04-13 DIAGNOSIS — R109 Unspecified abdominal pain: Secondary | ICD-10-CM | POA: Diagnosis not present

## 2021-04-13 DIAGNOSIS — K829 Disease of gallbladder, unspecified: Secondary | ICD-10-CM

## 2021-04-13 LAB — CBC WITH DIFFERENTIAL/PLATELET
Basophils Absolute: 0 10*3/uL (ref 0.0–0.1)
Basophils Relative: 0.3 % (ref 0.0–3.0)
Eosinophils Absolute: 0.2 10*3/uL (ref 0.0–0.7)
Eosinophils Relative: 1.9 % (ref 0.0–5.0)
HCT: 37.6 % (ref 36.0–46.0)
Hemoglobin: 12.3 g/dL (ref 12.0–15.0)
Lymphocytes Relative: 20.5 % (ref 12.0–46.0)
Lymphs Abs: 1.8 10*3/uL (ref 0.7–4.0)
MCHC: 32.8 g/dL (ref 30.0–36.0)
MCV: 79.6 fl (ref 78.0–100.0)
Monocytes Absolute: 0.7 10*3/uL (ref 0.1–1.0)
Monocytes Relative: 7.9 % (ref 3.0–12.0)
Neutro Abs: 6 10*3/uL (ref 1.4–7.7)
Neutrophils Relative %: 69.4 % (ref 43.0–77.0)
Platelets: 384 10*3/uL (ref 150.0–400.0)
RBC: 4.73 Mil/uL (ref 3.87–5.11)
RDW: 15.7 % — ABNORMAL HIGH (ref 11.5–15.5)
WBC: 8.6 10*3/uL (ref 4.0–10.5)

## 2021-04-13 LAB — COMPREHENSIVE METABOLIC PANEL
ALT: 13 U/L (ref 0–35)
AST: 18 U/L (ref 0–37)
Albumin: 4.7 g/dL (ref 3.5–5.2)
Alkaline Phosphatase: 43 U/L (ref 39–117)
BUN: 8 mg/dL (ref 6–23)
CO2: 29 mEq/L (ref 19–32)
Calcium: 9.6 mg/dL (ref 8.4–10.5)
Chloride: 102 mEq/L (ref 96–112)
Creatinine, Ser: 0.73 mg/dL (ref 0.40–1.20)
GFR: 95.31 mL/min (ref >60.00)
Glucose, Bld: 99 mg/dL (ref 70–99)
Potassium: 4.4 mEq/L (ref 3.5–5.1)
Sodium: 139 mEq/L (ref 135–145)
Total Bilirubin: 0.5 mg/dL (ref 0.2–1.2)
Total Protein: 7.8 g/dL (ref 6.0–8.3)

## 2021-04-13 LAB — BASIC METABOLIC PANEL
BUN: 8 mg/dL (ref 6–23)
CO2: 29 mEq/L (ref 19–32)
Calcium: 9.6 mg/dL (ref 8.4–10.5)
Chloride: 102 mEq/L (ref 96–112)
Creatinine, Ser: 0.73 mg/dL (ref 0.40–1.20)
GFR: 95.31 mL/min (ref >60.00)
Glucose, Bld: 99 mg/dL (ref 70–99)
Potassium: 4.4 mEq/L (ref 3.5–5.1)
Sodium: 139 mEq/L (ref 135–145)

## 2021-04-13 IMAGING — CT CT ABD-PELV W/ CM
1 of 3 series · 13 of 32 positions shown, 19 images · IV contrast (APPLIED)
Comparison: Abdominal ultrasound [DATE].

CLINICAL DATA: Abdominal pain and abdominal distension.

EXAM:
CT ABDOMEN AND PELVIS WITH CONTRAST
TECHNIQUE: Multidetector CT imaging of the abdomen and pelvis was performed
using the standard protocol following bolus administration of
intravenous contrast.
CONTRAST:  100mL [VT] IOPAMIDOL ([VT]) INJECTION 61%

[Series 2: abd/pelvis w/cm · axial · 0.78mm/px · z∈[-612,-192]mm · 13 of 98 slices shown, 19 images]
[im 7/98  soft-tissue]
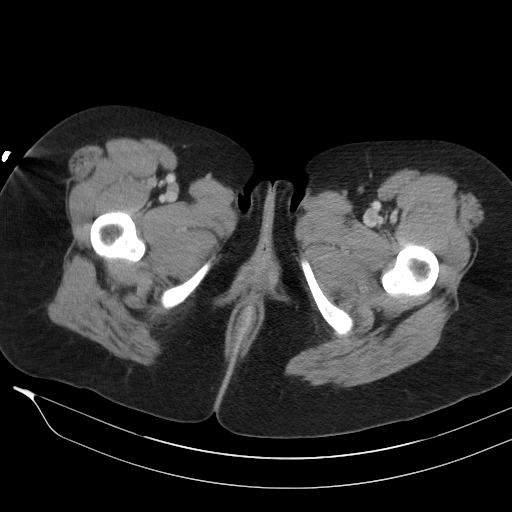
[im 7/98  bone]
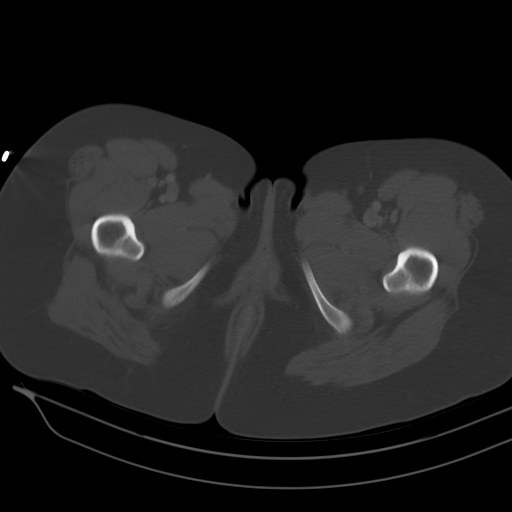
[im 14/98  soft-tissue]
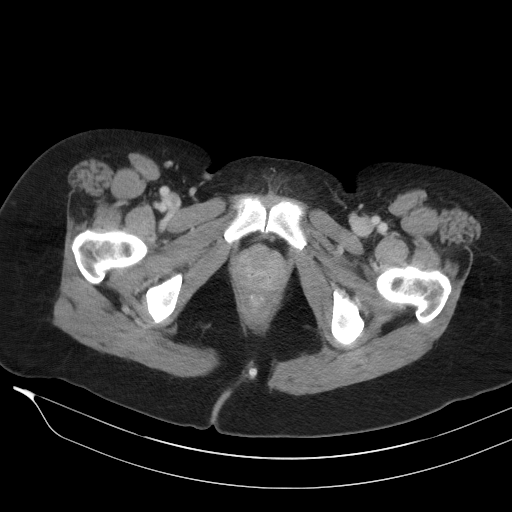
[im 21/98  soft-tissue]
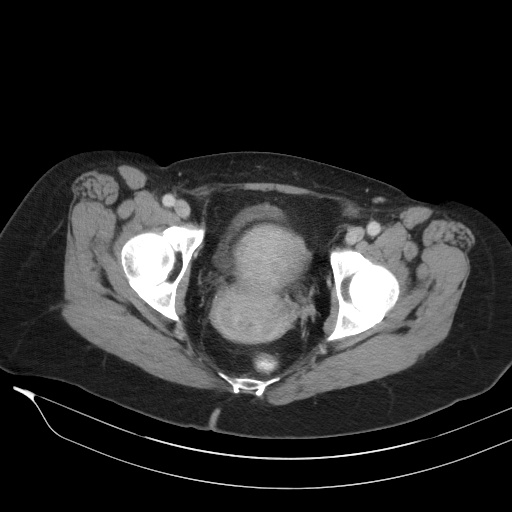
[im 28/98  soft-tissue]
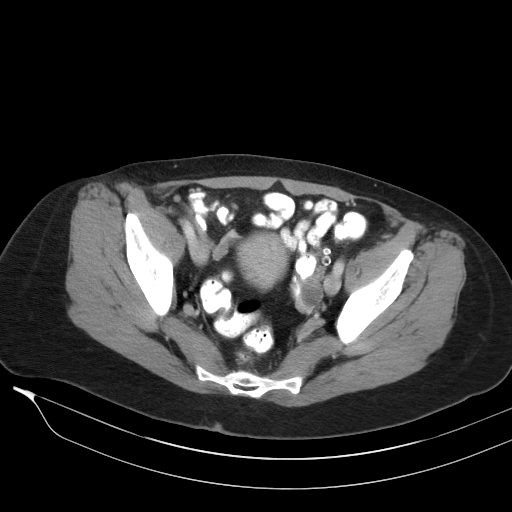
[im 35/98  soft-tissue]
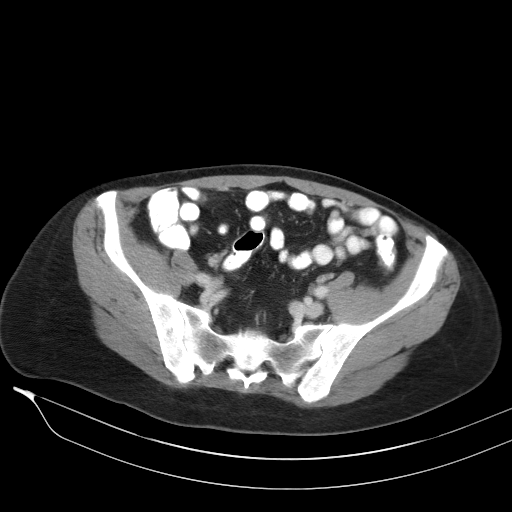
[im 42/98  soft-tissue]
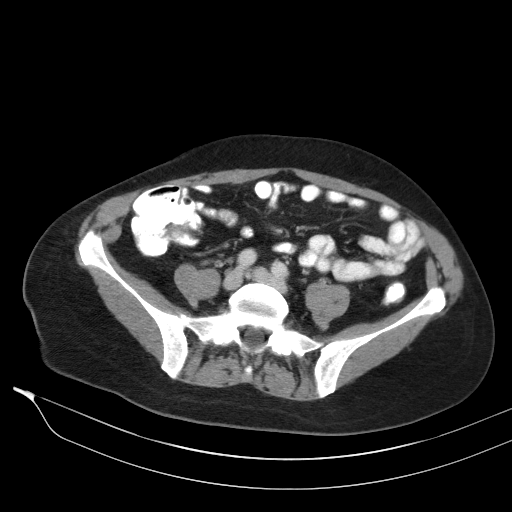
[im 49/98  soft-tissue]
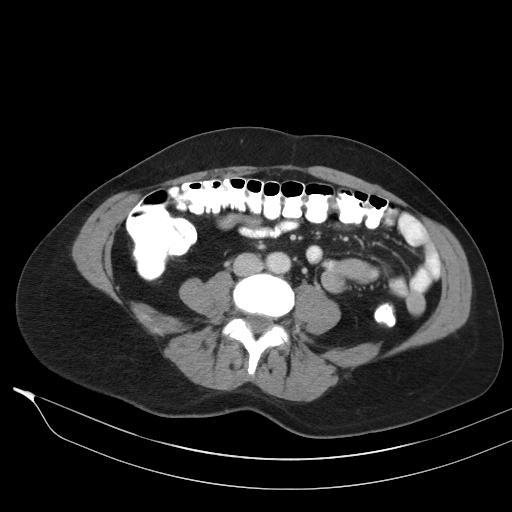
[im 56/98  soft-tissue]
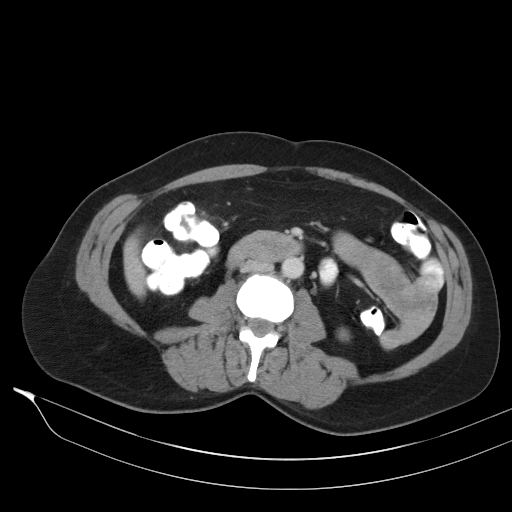
[im 63/98  soft-tissue]
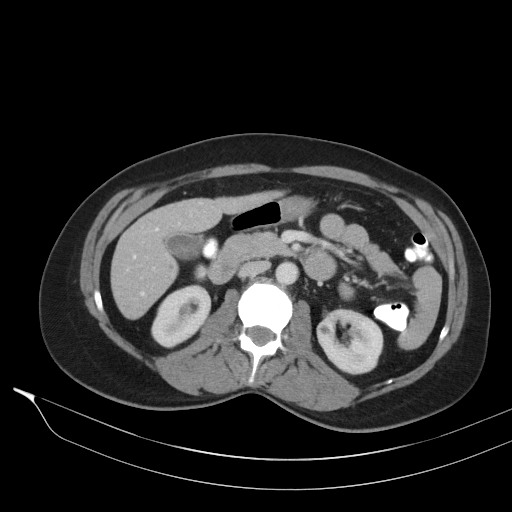
[im 63/98  bone]
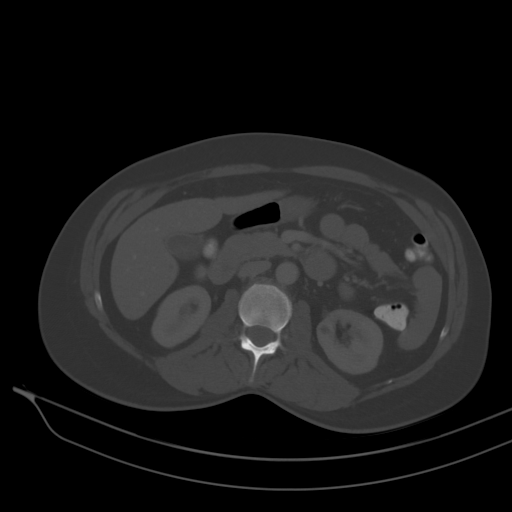
[im 70/98  soft-tissue]
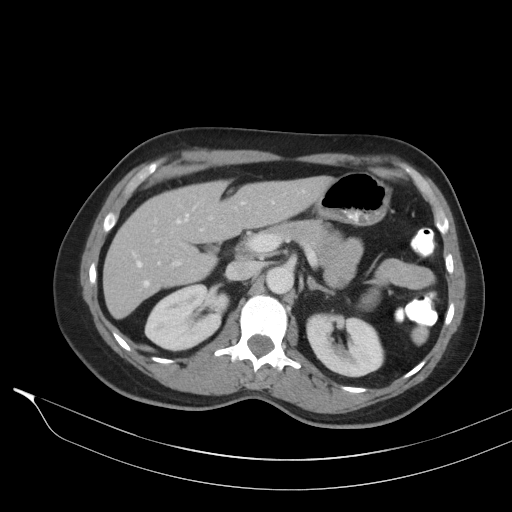
[im 70/98  lung]
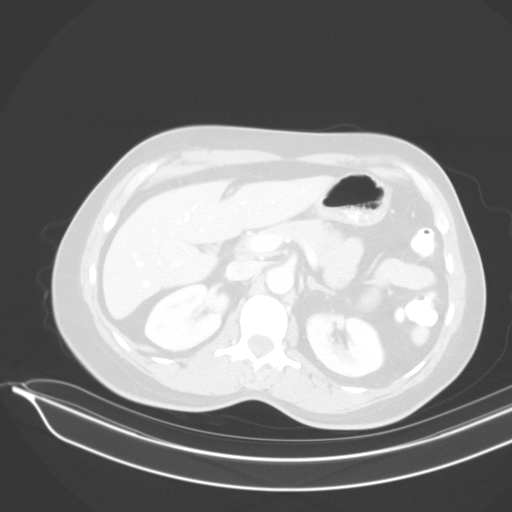
[im 77/98  soft-tissue]
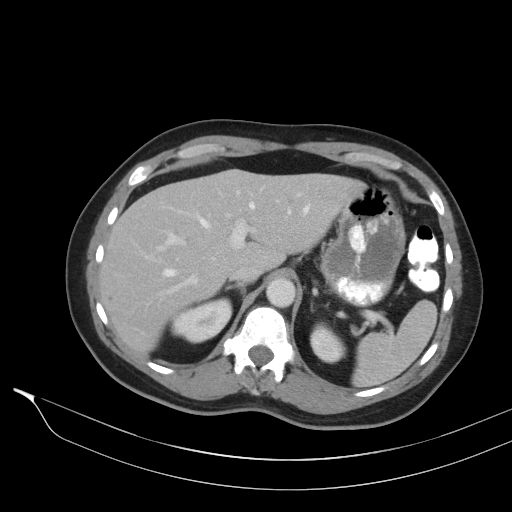
[im 77/98  lung]
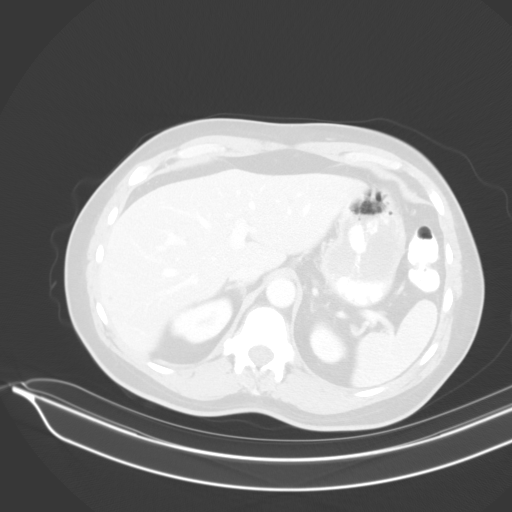
[im 84/98  soft-tissue]
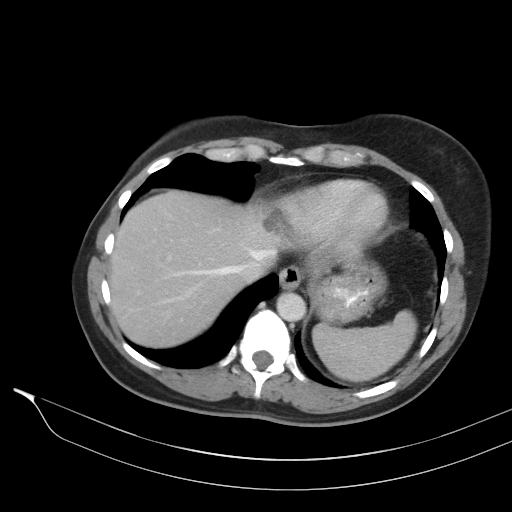
[im 84/98  lung]
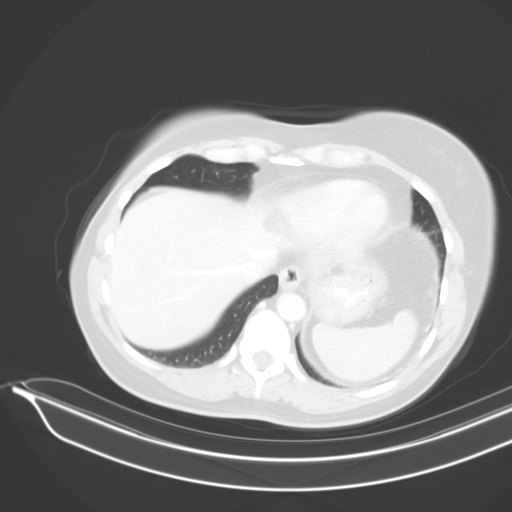
[im 91/98  soft-tissue]
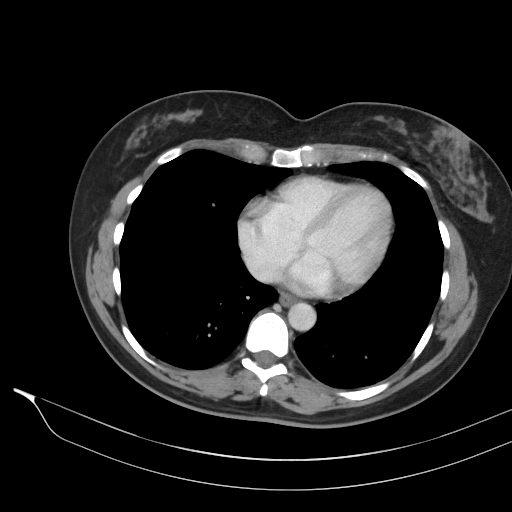
[im 91/98  lung]
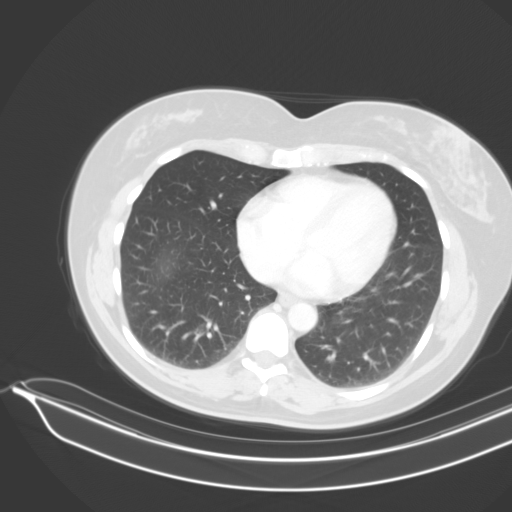

[13 of 32 positions shown; findings below may reference images not displayed]

FINDINGS: Lower chest: No acute abnormality.

Hepatobiliary: There is a lobulated cyst in the left lobe of the
liver measuring 2.3 x 2.8 cm which appear similar in size compared
to prior ultrasound. The liver, gallbladder and bile ducts are
otherwise within normal limits.

Pancreas: Unremarkable. No pancreatic ductal dilatation or
surrounding inflammatory changes.

Spleen: Normal in size without focal abnormality.

Adrenals/Urinary Tract: There are some rounded hypodensities in both
kidneys which are too small to characterize, most likely cysts.
Otherwise, the kidneys, adrenal glands and bladder are within normal
limits.

Stomach/Bowel: Stomach is within normal limits. Appendix appears
normal. No evidence of bowel wall thickening, distention, or
inflammatory changes. There is sigmoid colon diverticulosis without
evidence for diverticulitis.

Vascular/Lymphatic: No significant vascular findings are present. No
enlarged abdominal or pelvic lymph nodes.

Reproductive: There is a rounded 1 cm lesion in the mid endometrium.
Otherwise, the uterus and ovaries are within normal limits.

Other: No abdominal wall hernia or abnormality. No abdominopelvic
ascites.

Musculoskeletal: No acute or significant osseous findings.
IMPRESSION: 1. 1 cm lesion in the endometrium, indeterminate. Recommend
follow-up pelvic ultrasound for further evaluation.
2. Sigmoid colon diverticulosis without evidence for acute
diverticulitis.
3. Unchanged hepatic complex cyst.

## 2021-04-13 MED ORDER — IOPAMIDOL (ISOVUE-300) INJECTION 61%
100.0000 mL | Freq: Once | INTRAVENOUS | Status: AC | PRN
  Administered 2021-04-13: 100 mL via INTRAVENOUS

## 2021-04-13 NOTE — Assessment & Plan Note (Addendum)
Acute Right sided abd pain x few days, associated bloating, diarrhea for few days and right posterior shoulder pain Known decreased gallbladder function so concern for cholecystitis On exam she is tender in the right upper quadrant and the right lower quadrant with mild guarding Appendicitis is also of concern CBC, CMP/BMP stat CT abdomen pelvis today Further treatment depending on above

## 2021-04-13 NOTE — Assessment & Plan Note (Signed)
Acute Associated with pain in her right upper quadrant, right lower quadrant, decreased appetite and some diarrhea She is known to decreased gallbladder function so her symptoms could be related to her gallbladder She is tender in the right upper quadrant and right lower quadrant, so differential could be appendicitis She has some guarding on exam that is mild CBC, CMP/BMP CT of the abdomen pelvis today Further treatment depending on above

## 2021-04-13 NOTE — Patient Instructions (Signed)
     Blood work was ordered.      A Ct scan was ordered.

## 2021-04-14 ENCOUNTER — Other Ambulatory Visit: Payer: BC Managed Care – PPO

## 2021-04-15 ENCOUNTER — Ambulatory Visit: Payer: BC Managed Care – PPO | Admitting: Internal Medicine

## 2021-04-22 ENCOUNTER — Ambulatory Visit
Admission: RE | Admit: 2021-04-22 | Discharge: 2021-04-22 | Disposition: A | Discharge status: Home / Self Care | Payer: BC Managed Care – PPO | Source: Ambulatory Visit

## 2021-04-22 DIAGNOSIS — Z1231 Encounter for screening mammogram for malignant neoplasm of breast: Secondary | ICD-10-CM

## 2021-04-22 IMAGING — MG MM DIGITAL SCREENING BILAT W/ TOMO AND CAD
8 series · 8 of 24 positions shown · non-contrast
Comparison: Previous exam(s).

CLINICAL DATA: Screening.

EXAM:
DIGITAL SCREENING BILATERAL MAMMOGRAM WITH TOMOSYNTHESIS AND CAD
TECHNIQUE: Bilateral screening digital craniocaudal and mediolateral oblique
mammograms were obtained. Bilateral screening digital breast
tomosynthesis was performed. The images were evaluated with
computer-aided detection.

[L MLO synth-2D]
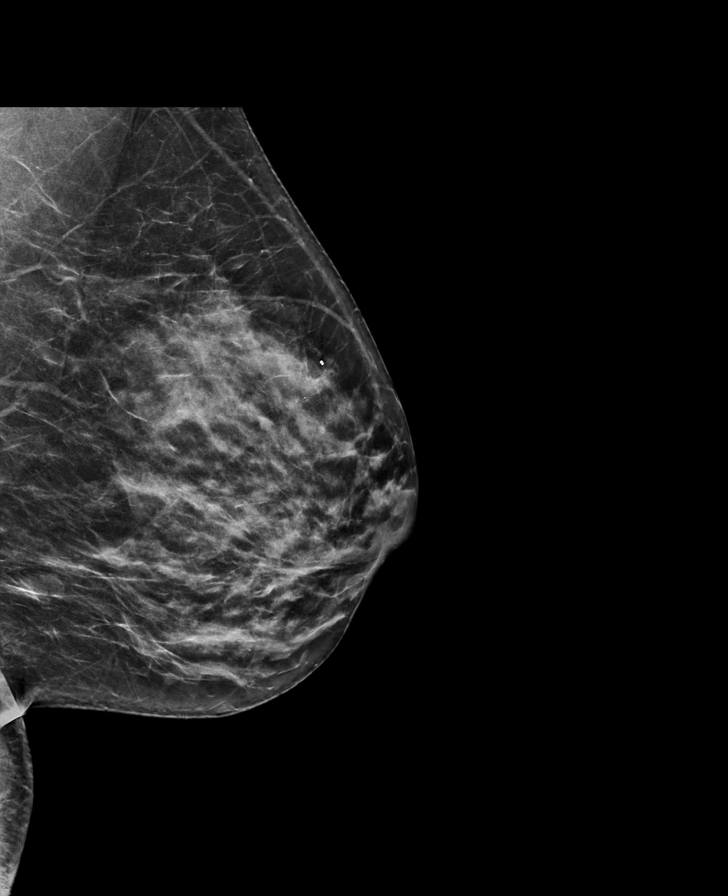

[R MLO synth-2D]
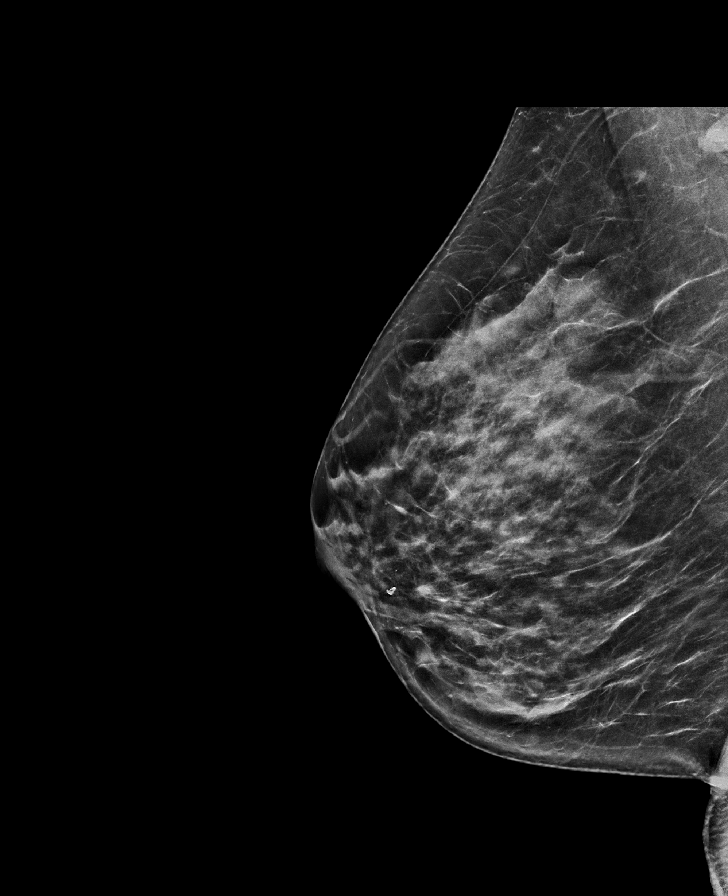

[L CC synth-2D]
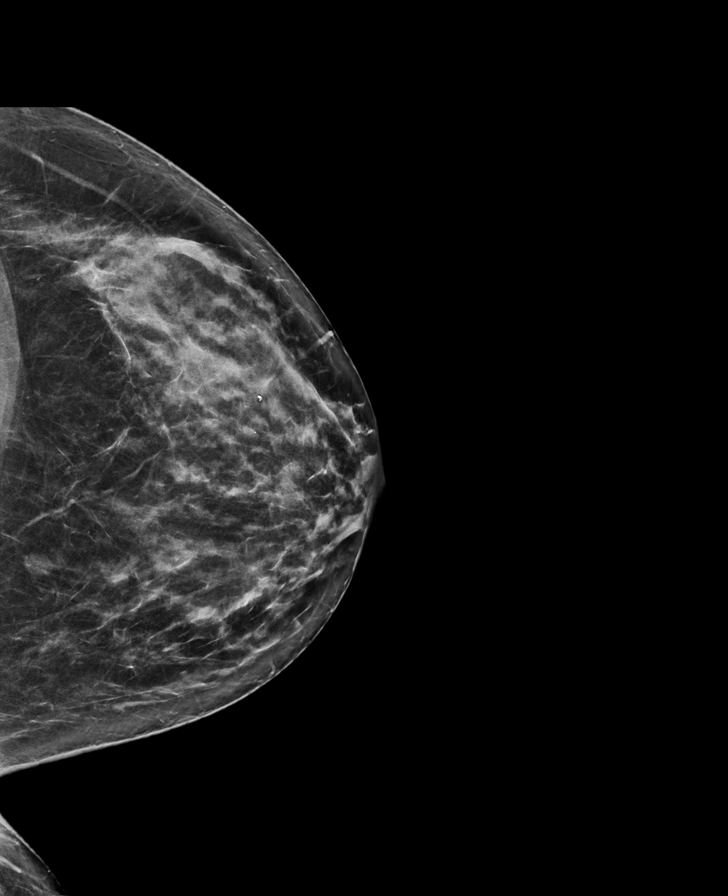

[R CC synth-2D]
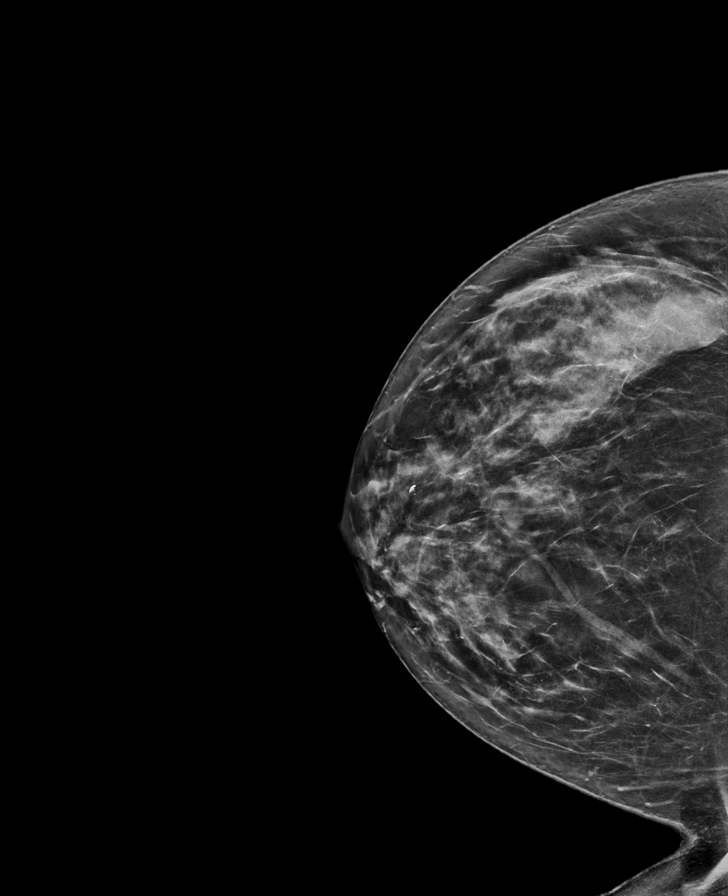

[R CC tomo · tomo slice 35/68.0]
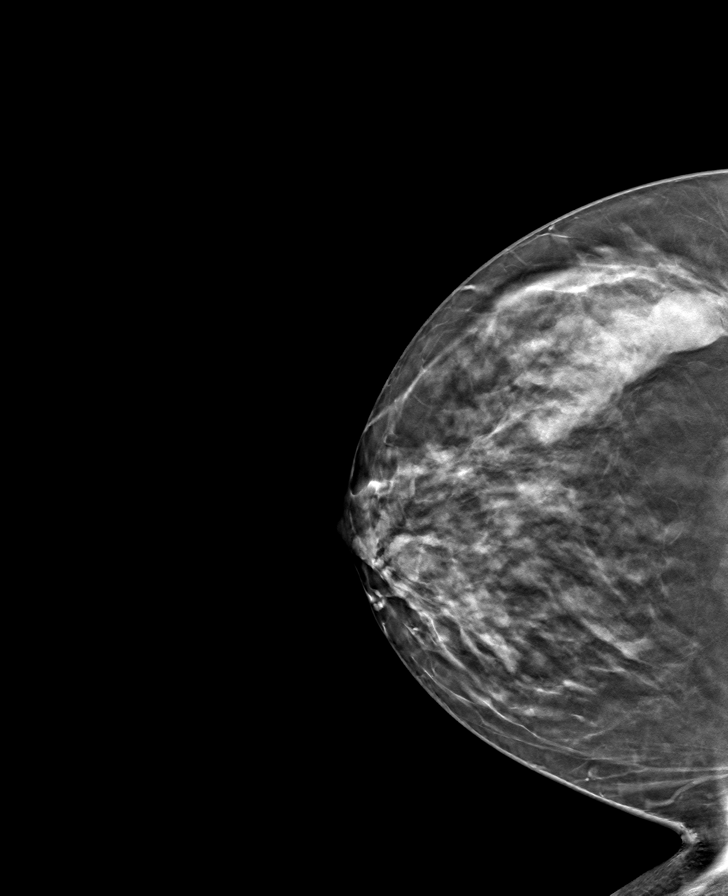

[L MLO tomo · tomo slice 34/67.0]
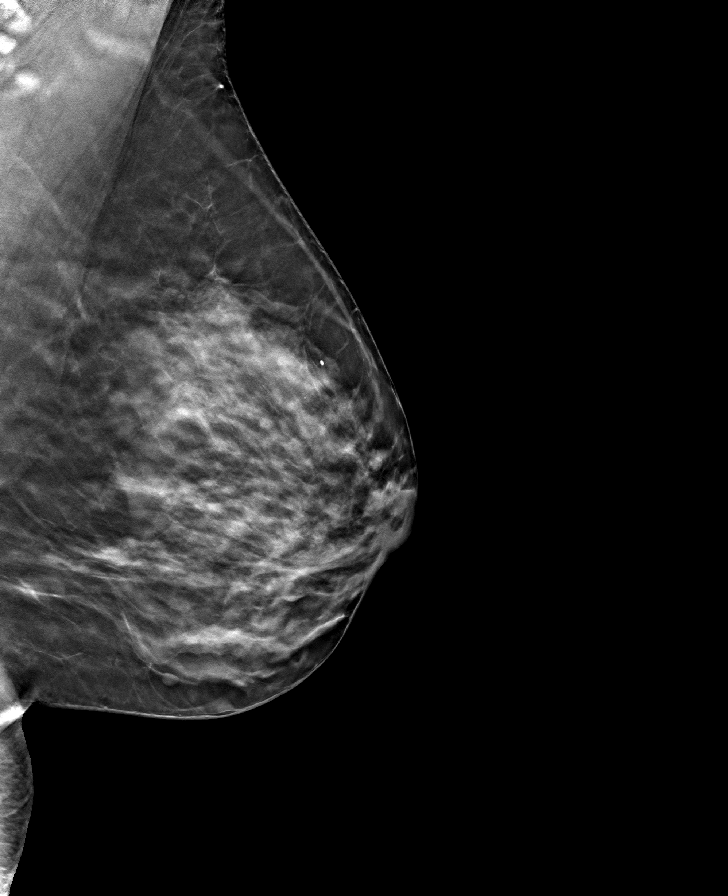

[L CC tomo · tomo slice 35/68.0]
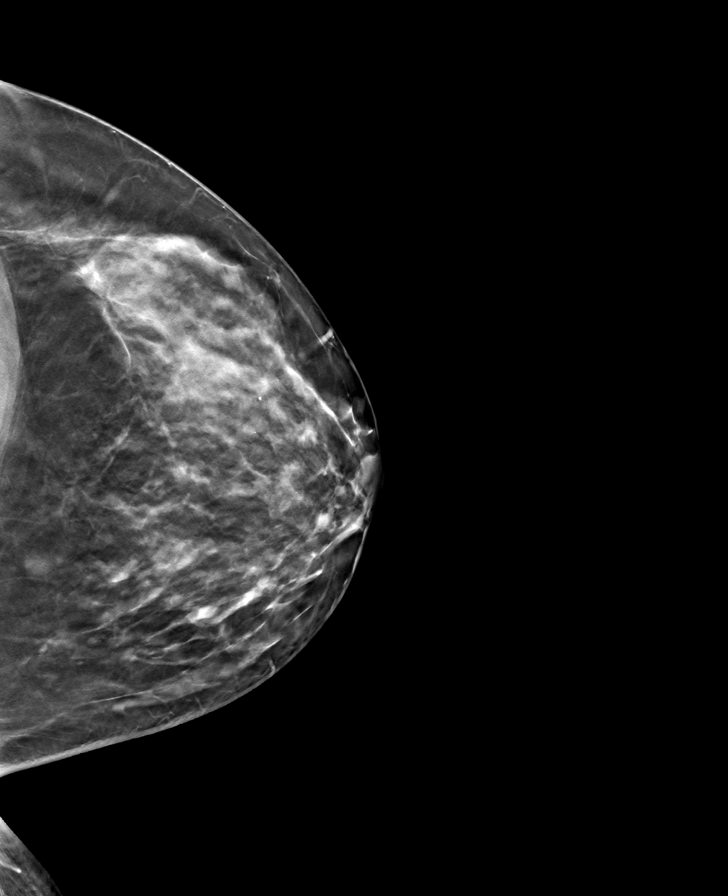

[R MLO tomo · tomo slice 34/67.0]
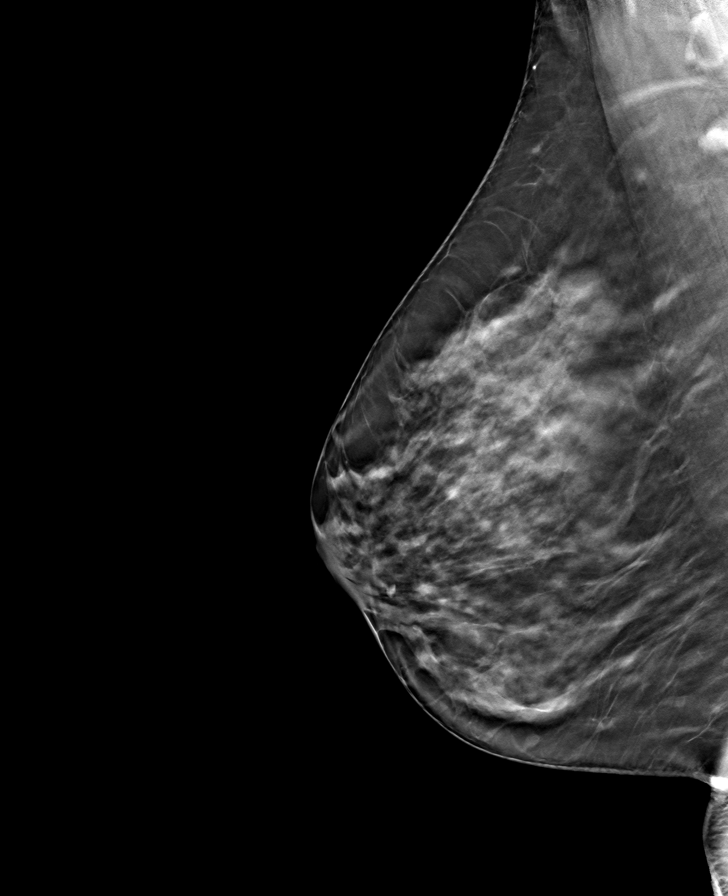

[8 of 24 positions shown; findings below may reference images not displayed]

ACR Breast Density Category c: The breast tissue is heterogeneously
dense, which may obscure small masses.
FINDINGS: In the right breast, calcifications warrant further evaluation with
magnified views. In the left breast, no findings suspicious for
malignancy.
IMPRESSION: Further evaluation is suggested for calcifications in the right
breast.

RECOMMENDATION:
Diagnostic mammogram of the right breast. (Code:[CM])

The patient will be contacted regarding the findings, and additional
imaging will be scheduled.

BI-RADS CATEGORY  0: Incomplete. Need additional imaging evaluation
and/or prior mammograms for comparison.

## 2021-04-25 ENCOUNTER — Other Ambulatory Visit: Payer: Self-pay | Admitting: Internal Medicine

## 2021-04-25 DIAGNOSIS — R928 Other abnormal and inconclusive findings on diagnostic imaging of breast: Secondary | ICD-10-CM

## 2021-04-25 DIAGNOSIS — R9389 Abnormal findings on diagnostic imaging of other specified body structures: Secondary | ICD-10-CM | POA: Diagnosis not present

## 2021-05-06 ENCOUNTER — Encounter: Payer: Self-pay | Admitting: Internal Medicine

## 2021-05-12 MED ORDER — ESCITALOPRAM OXALATE 10 MG PO TABS: 10.0000 mg | ORAL_TABLET | Freq: Every day | ORAL | 5 refills | Status: DC

## 2021-05-26 ENCOUNTER — Other Ambulatory Visit: Payer: Self-pay | Admitting: Internal Medicine

## 2021-05-26 ENCOUNTER — Ambulatory Visit
Admission: RE | Admit: 2021-05-26 | Discharge: 2021-05-26 | Disposition: A | Discharge status: Home / Self Care | Payer: BC Managed Care – PPO | Source: Ambulatory Visit | Attending: Internal Medicine | Admitting: Internal Medicine

## 2021-05-26 DIAGNOSIS — R922 Inconclusive mammogram: Secondary | ICD-10-CM | POA: Diagnosis not present

## 2021-05-26 DIAGNOSIS — R928 Other abnormal and inconclusive findings on diagnostic imaging of breast: Secondary | ICD-10-CM

## 2021-05-26 DIAGNOSIS — R921 Mammographic calcification found on diagnostic imaging of breast: Secondary | ICD-10-CM | POA: Diagnosis not present

## 2021-05-26 IMAGING — MG MM DIGITAL DIAGNOSTIC UNILAT*R*
3 series · 3 of 3 positions shown · non-contrast
Comparison: Previous exams.

CLINICAL DATA: Screening recall for right breast calcifications.

EXAM:
DIGITAL DIAGNOSTIC UNILATERAL RIGHT MAMMOGRAM
TECHNIQUE: Right digital diagnostic mammography was performed. Mammographic
images were processed with CAD.

[R ML (1 of 2)]
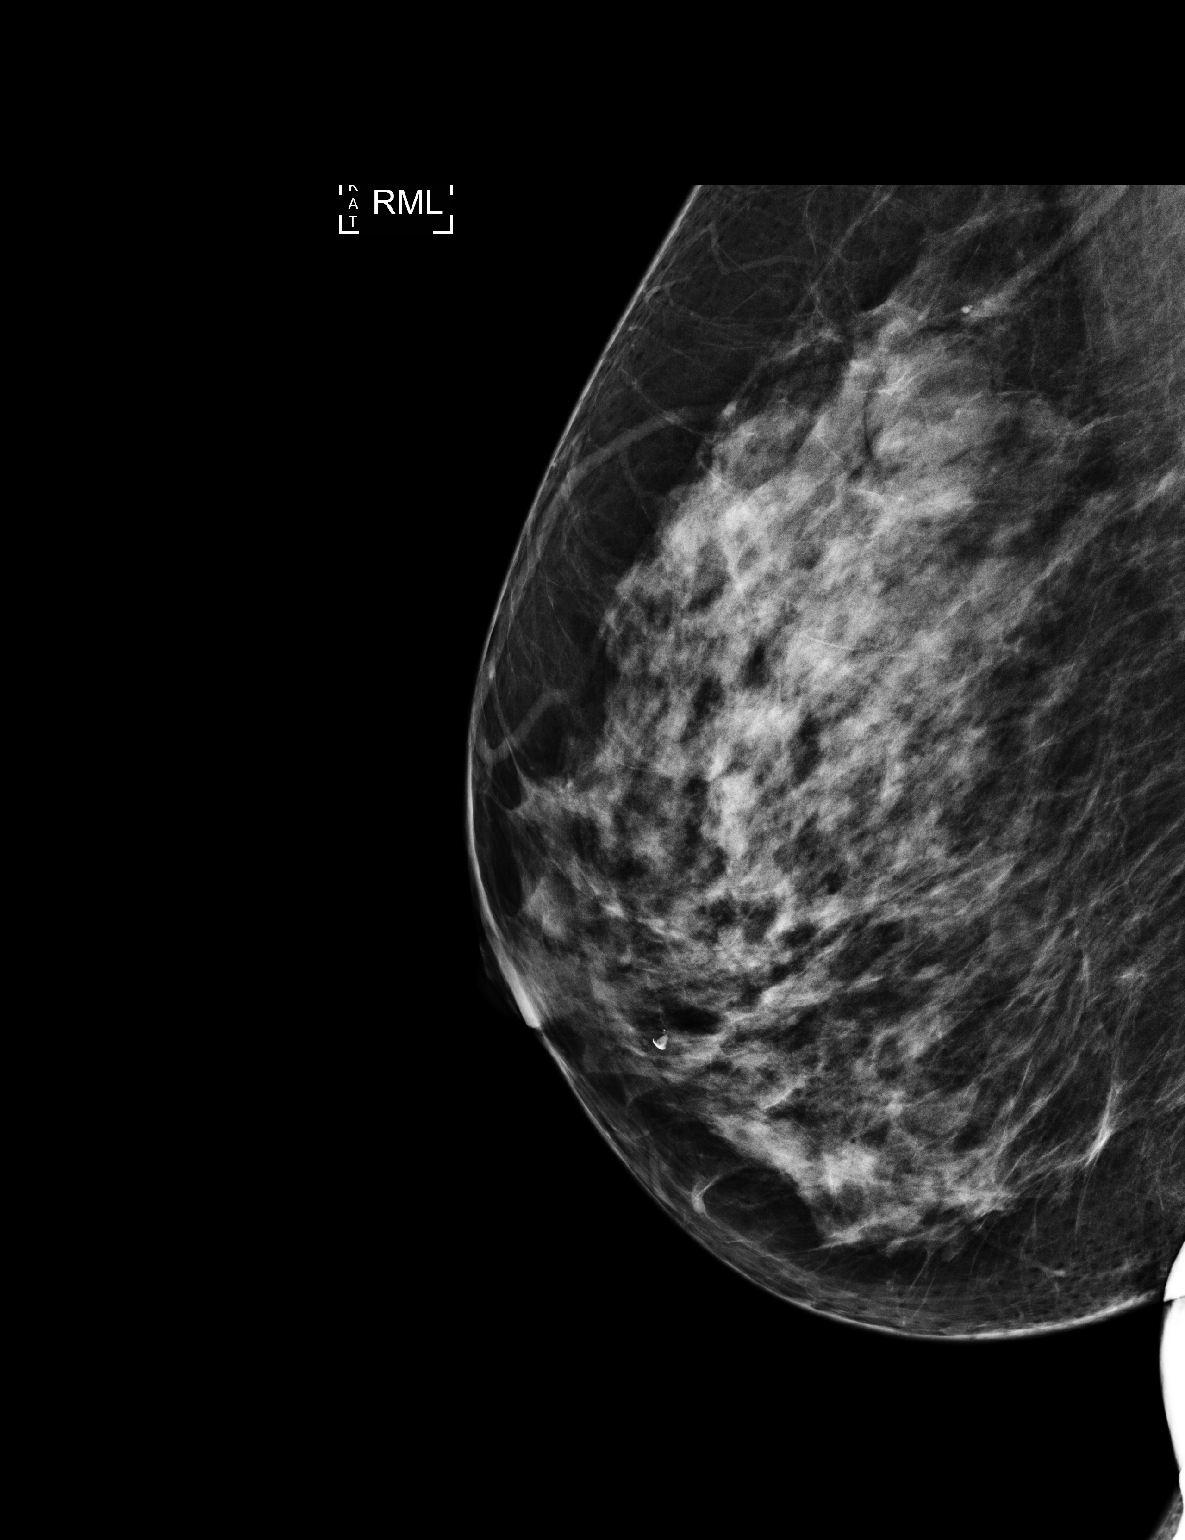

[R ML (2 of 2)]
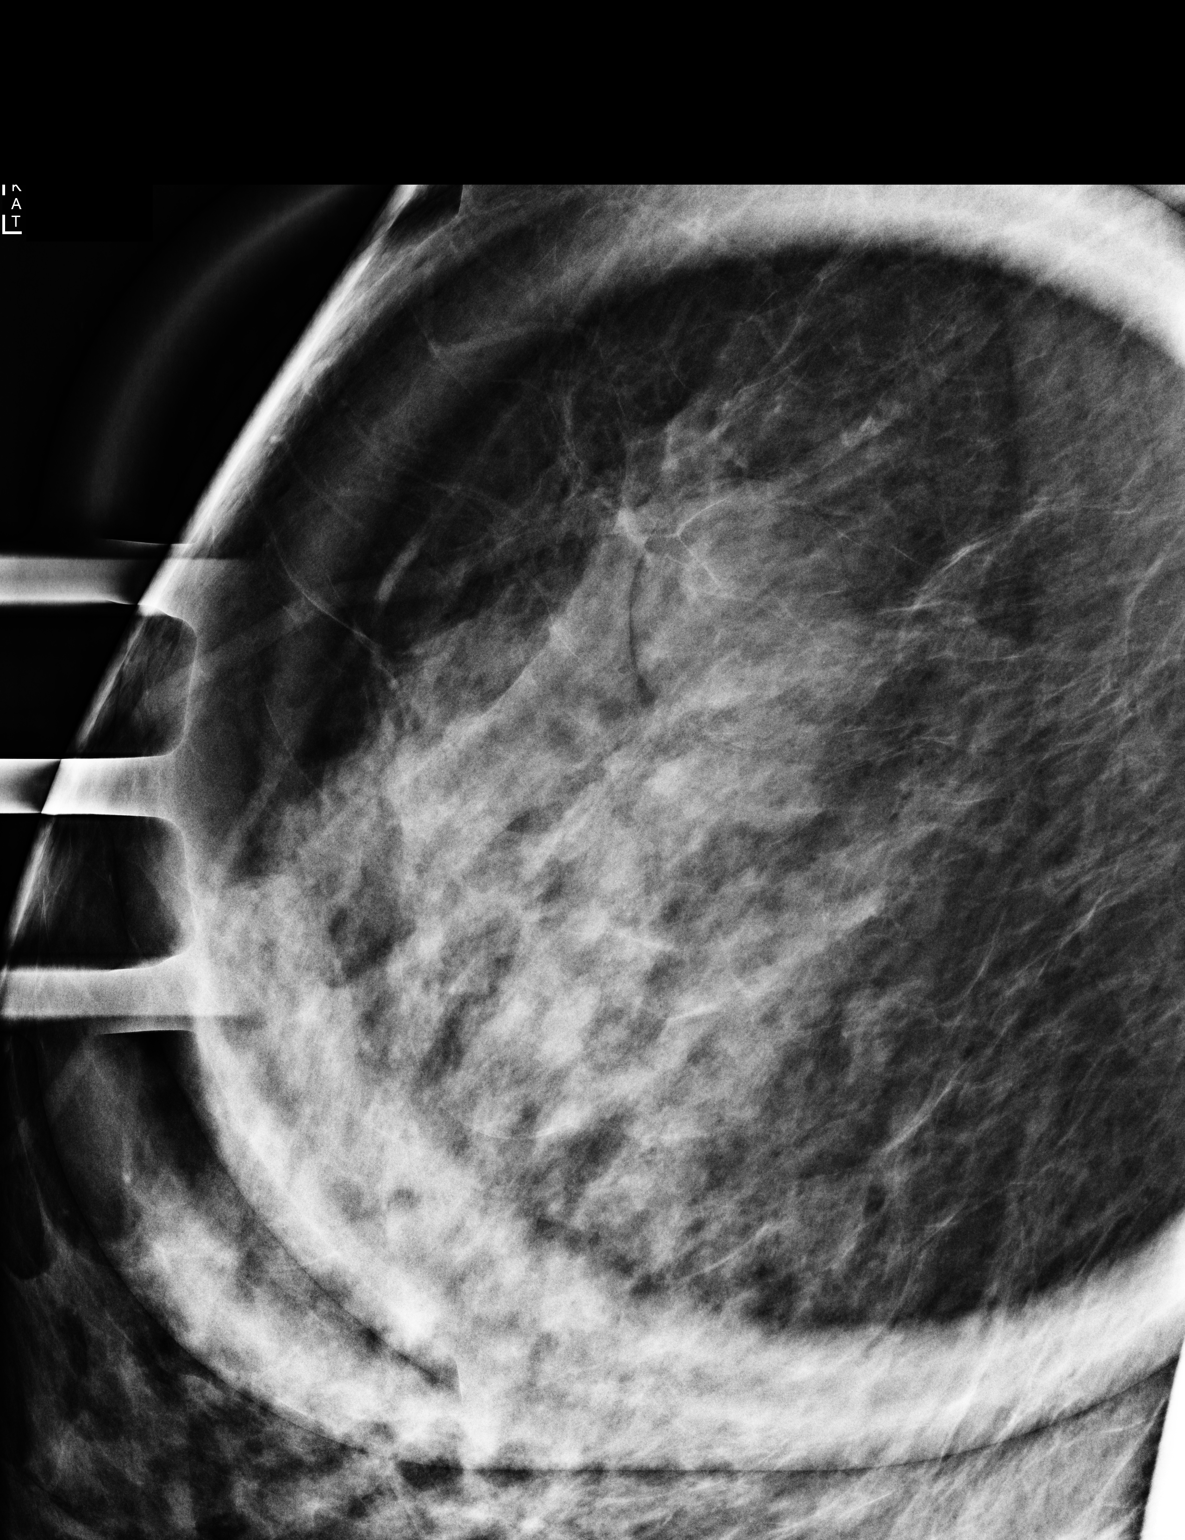

[R CC]
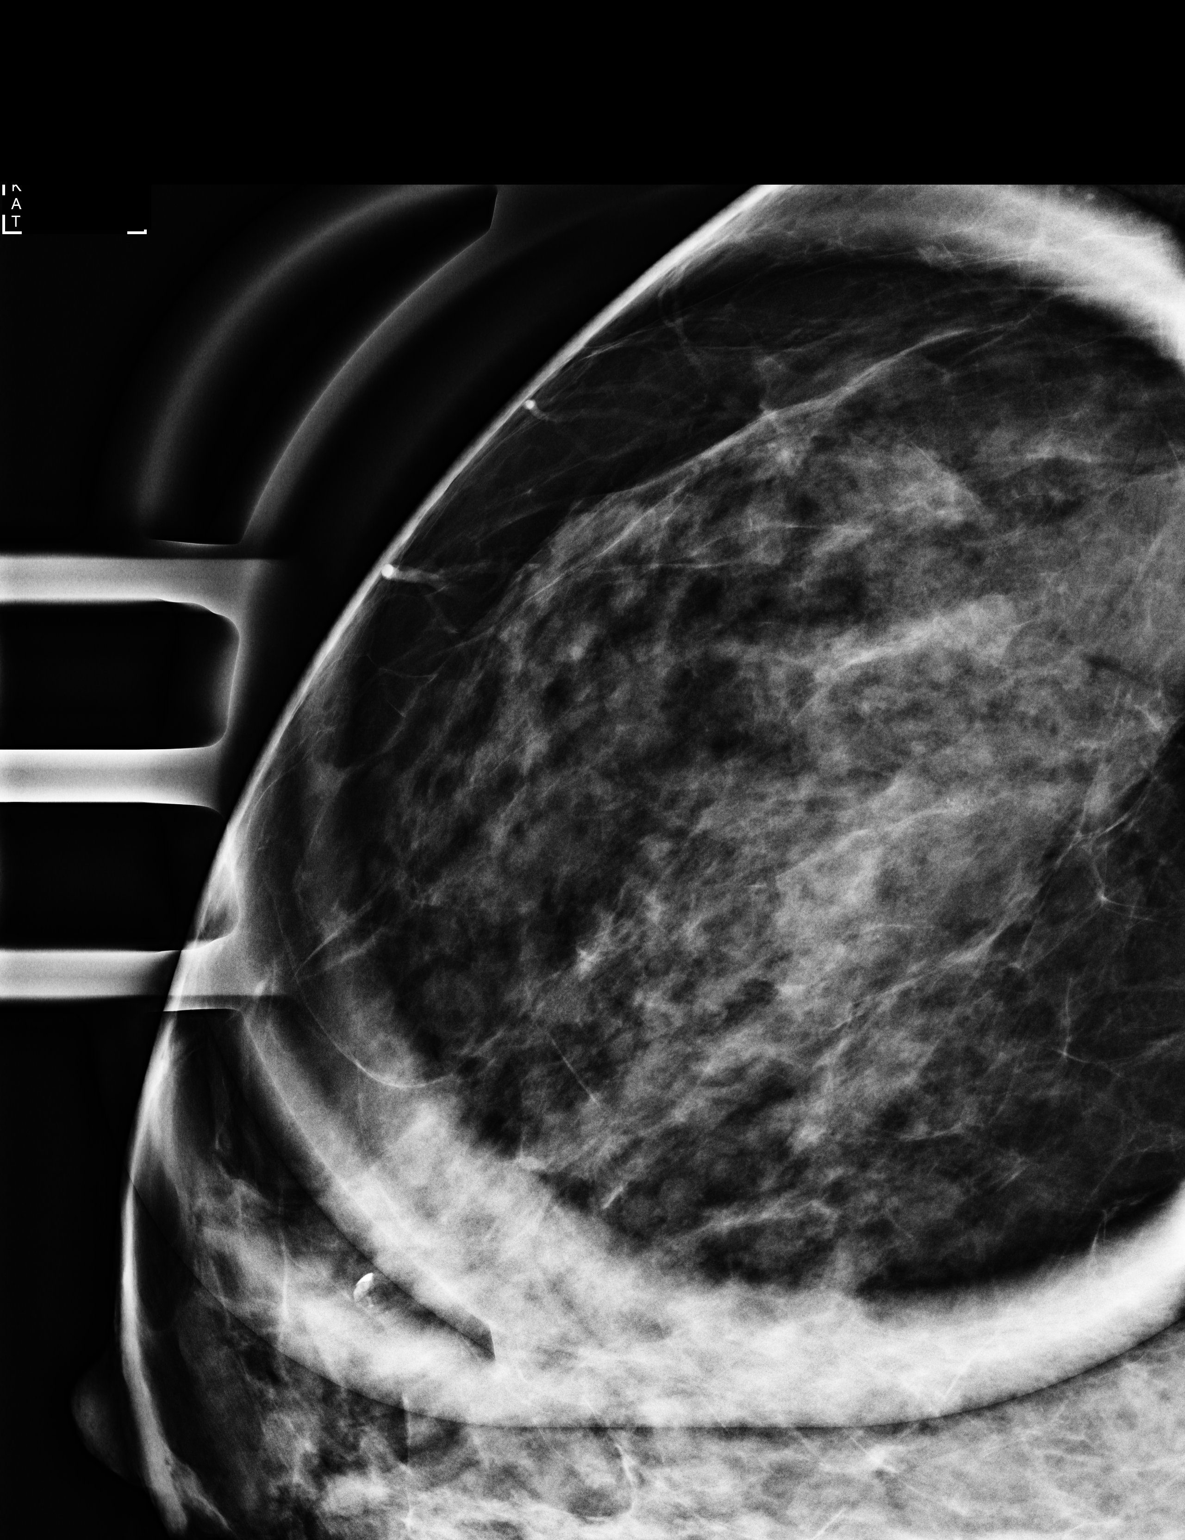

[3 of 3 positions shown; findings below may reference images not displayed]

ACR Breast Density Category c: The breast tissue is heterogeneously
dense, which may obscure small masses.
FINDINGS: Spot compression magnification views were performed over the
upper-outer right breast. There is a 0.4 cm group of faint amorphous
calcifications.
IMPRESSION: Indeterminate 0.4 cm group of calcifications in the upper-outer
right breast.

RECOMMENDATION:
Recommend stereotactic guided biopsy of the calcifications in the
upper-outer right breast.

I have discussed the findings and recommendations with the patient.
If applicable, a reminder letter will be sent to the patient
regarding the next appointment.

BI-RADS CATEGORY  4: Suspicious.

## 2021-05-27 MED ORDER — PAROXETINE HCL 20 MG PO TABS: 20.0000 mg | ORAL_TABLET | Freq: Every day | ORAL | 3 refills | Status: DC

## 2021-05-27 NOTE — Addendum Note (Signed)
Addended by: Binnie Rail on: 05/27/2021 07:37 AM   Modules accepted: Orders

## 2021-06-08 ENCOUNTER — Ambulatory Visit
Admission: RE | Admit: 2021-06-08 | Discharge: 2021-06-08 | Disposition: A | Discharge status: Home / Self Care | Payer: BC Managed Care – PPO | Source: Ambulatory Visit | Attending: Internal Medicine | Admitting: Internal Medicine

## 2021-06-08 DIAGNOSIS — R921 Mammographic calcification found on diagnostic imaging of breast: Secondary | ICD-10-CM

## 2021-06-08 DIAGNOSIS — D0501 Lobular carcinoma in situ of right breast: Secondary | ICD-10-CM | POA: Diagnosis not present

## 2021-06-08 IMAGING — MG MM BREAST BX W/ LOC DEV 1ST LESION IMAGE BX SPEC STEREO GUIDE*R*
8 of 14 series · 8 of 26 positions shown · non-contrast
Comparison: Previous exams.
COMPARISON: Previous exams.

Addendum:
CLINICAL DATA: 50-year-old female with indeterminate right breast
calcifications.

EXAM:
RIGHT BREAST STEREOTACTIC CORE NEEDLE BIOPSY

[R (1 of 8)]
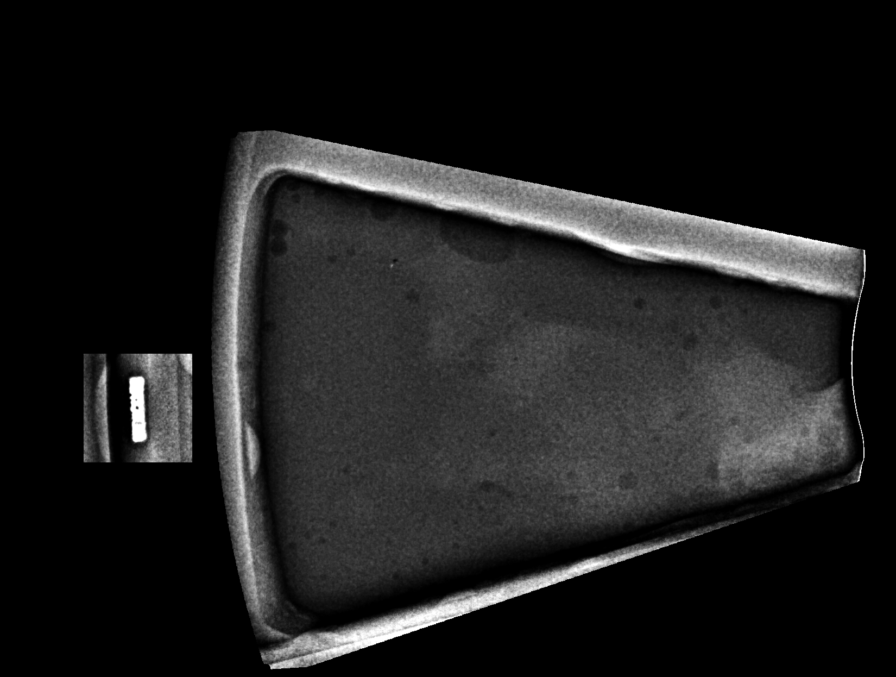

[R (2 of 8)]
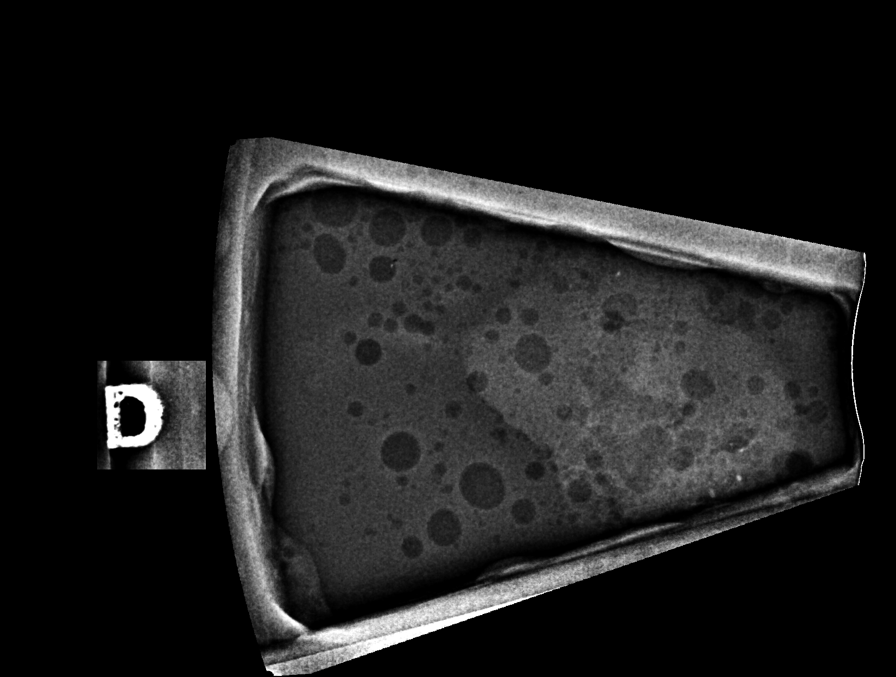

[R (3 of 8)]
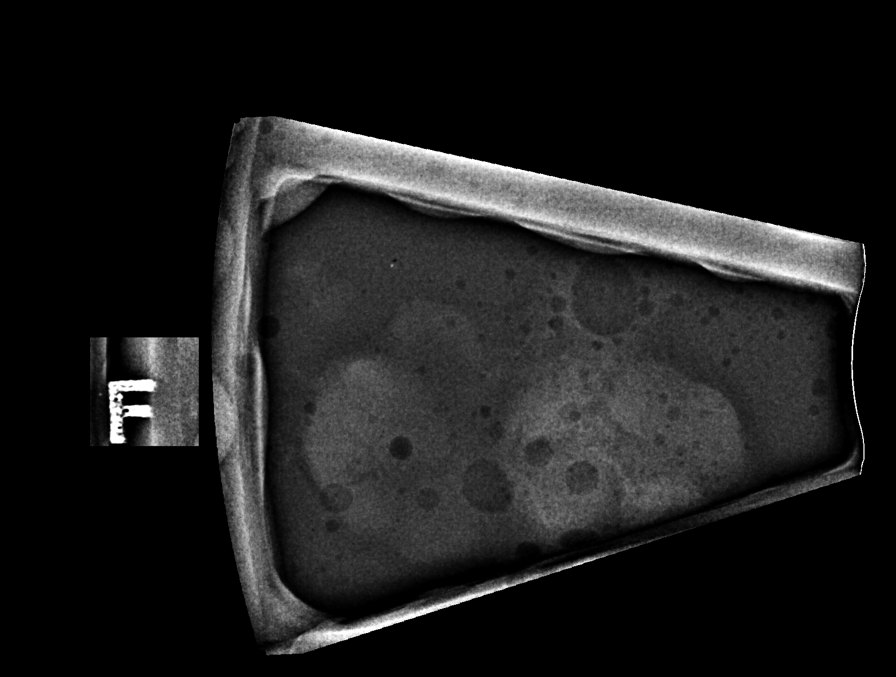

[R (4 of 8)]
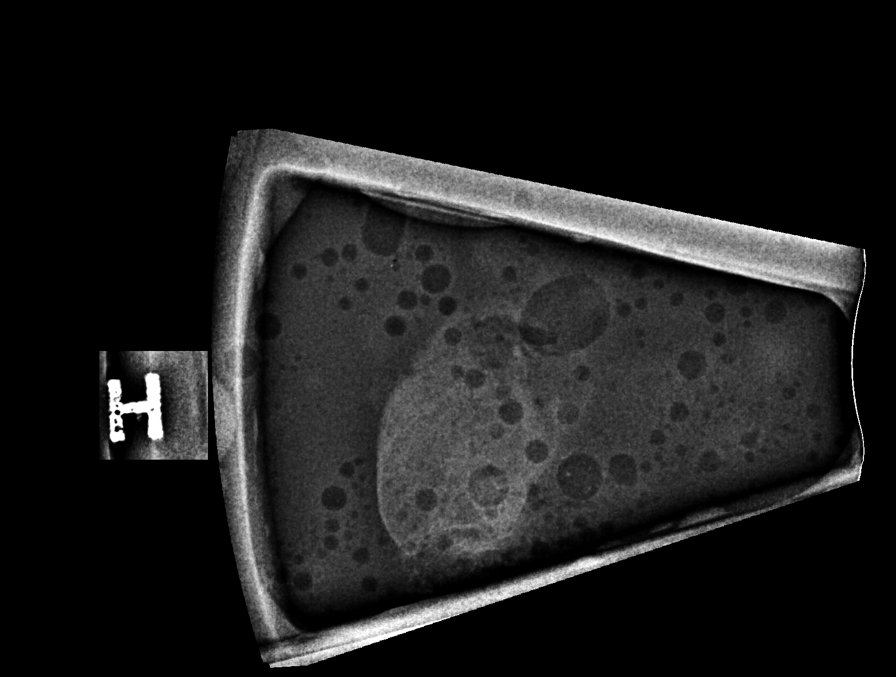

[R (5 of 8)]
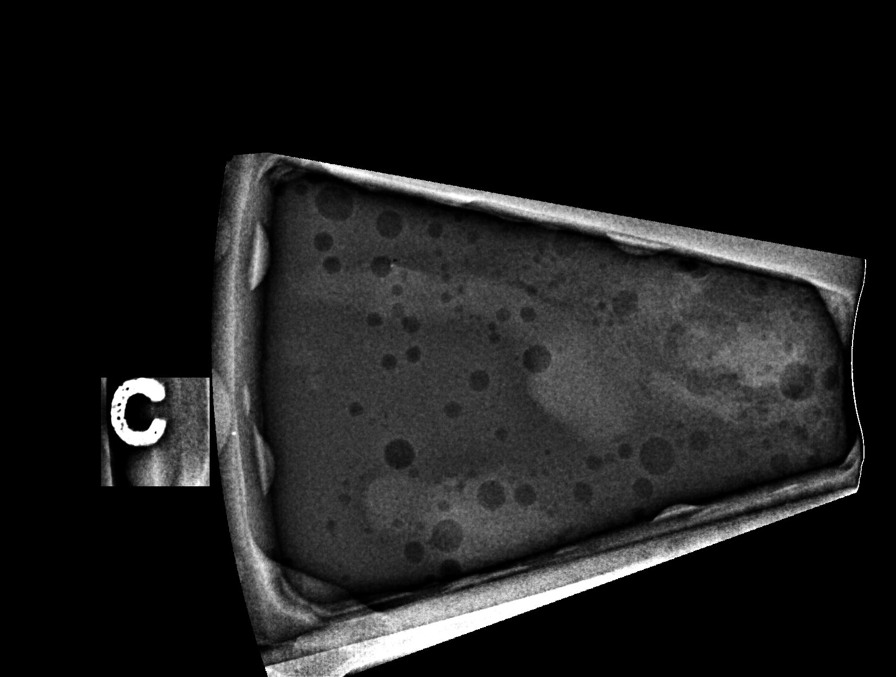

[R (6 of 8)]
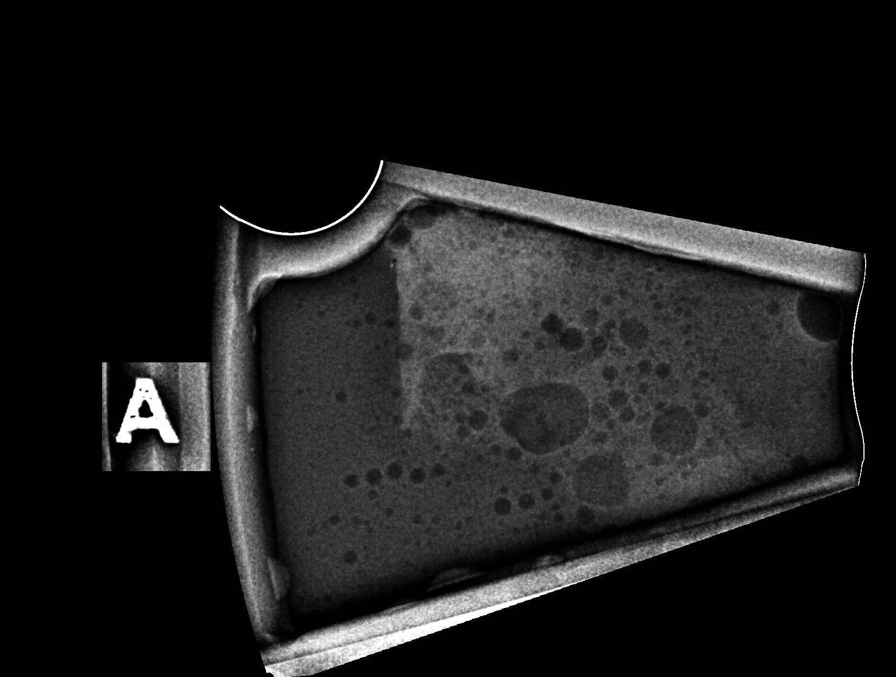

[R (7 of 8)]
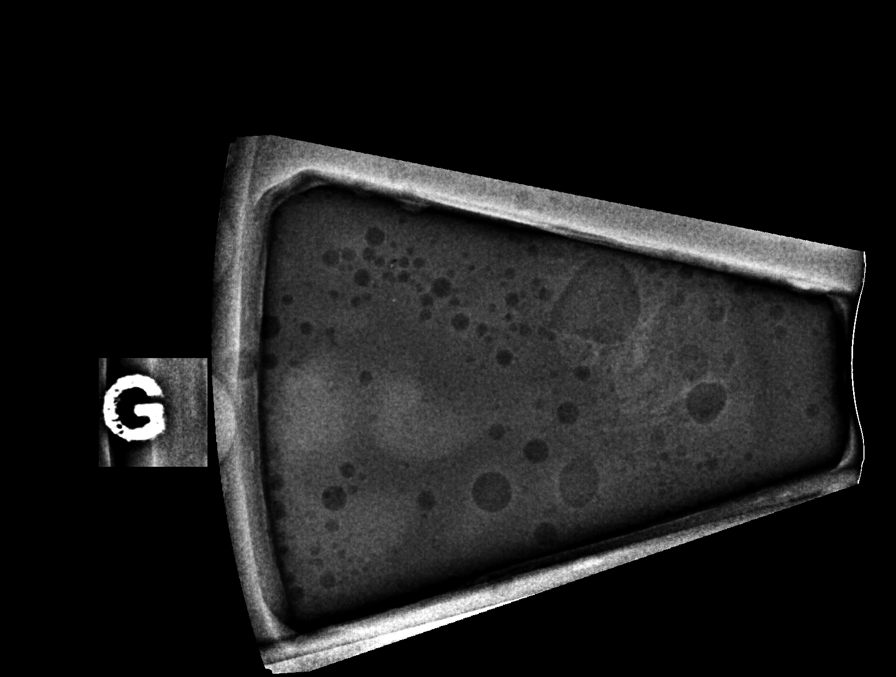

[R (8 of 8)]
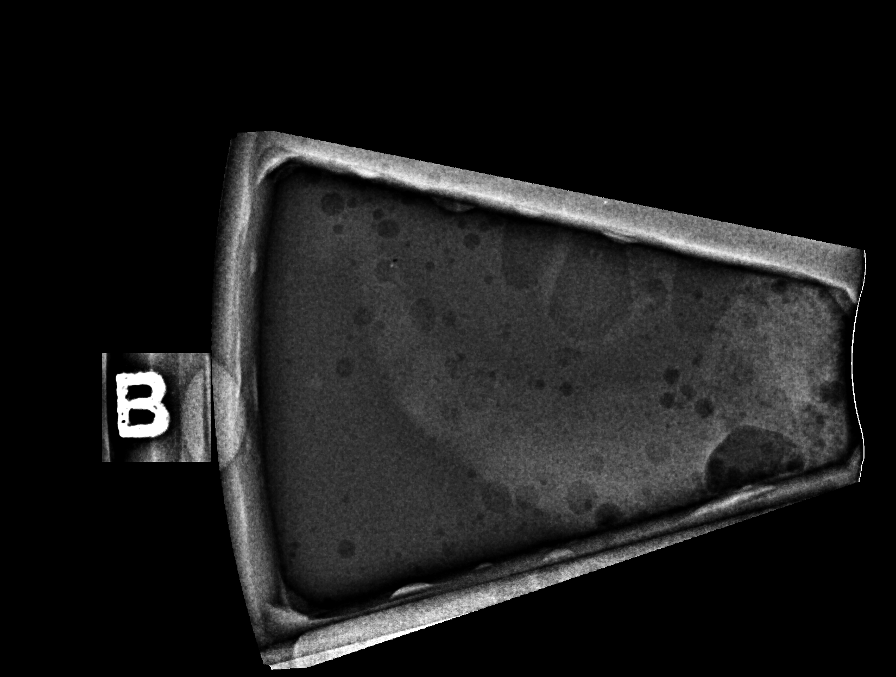

[8 of 26 positions shown; findings below may reference images not displayed]



Using sterile technique and 1% Lidocaine as local anesthetic, under
stereotactic guidance, a 9 gauge vacuum assisted device was used to
perform core needle biopsy of calcifications in the upper-outer
quadrant of the right breast using a superior approach. Specimen
radiograph was performed showing calcifications within the specimen.
Specimens with calcifications are identified for pathology.

Lesion quadrant: Upper outer quadrant

At the conclusion of the procedure, a coil shaped tissue marker clip
was deployed into the biopsy cavity. Follow-up 2-view mammogram was
performed and dictated separately.
IMPRESSION: Stereotactic-guided biopsy of the right breast. No apparent
complications.

ADDENDUM:
Pathology revealed LOBULAR NEOPLASIA (LOBULAR CARCINOMA IN-SITU),
COLUMNAR CELL AND FIBROCYSTIC CHANGES WITH CALCIFICATIONS,
PSEUDOANGIOMATOUS STROMAL HYPERPLASIA of the RIGHT breast, upper
outer quadrant, (coil clip). This was found to be concordant by Dr.
BRACK TIGER with surgical consultation and High Risk Screening
recommended.

Pathology results were discussed with the patient by telephone. The
patient reported doing well after the biopsy with tenderness at the
site. Post biopsy instructions and care were reviewed and questions
were answered. The patient was encouraged to call The [REDACTED] for any additional concerns. My direct number
was provided.

Follow up protocol for patients with lobular neoplasia being
observed will have diagnostic mammograms for two years (6 month
follow up, 6 month follow up, 12 month follow up), then returned to
annual screening mammograms.

Surgical consultation has been arranged with Dr. BRACK TIGER at
[REDACTED] on [DATE].

Pathology results reported by BRACK TIGER, RN on [DATE].



Using sterile technique and 1% Lidocaine as local anesthetic, under
stereotactic guidance, a 9 gauge vacuum assisted device was used to
perform core needle biopsy of calcifications in the upper-outer
quadrant of the right breast using a superior approach. Specimen
radiograph was performed showing calcifications within the specimen.
Specimens with calcifications are identified for pathology.

Lesion quadrant: Upper outer quadrant

At the conclusion of the procedure, a coil shaped tissue marker clip
was deployed into the biopsy cavity. Follow-up 2-view mammogram was
performed and dictated separately.
IMPRESSION: Stereotactic-guided biopsy of the right breast. No apparent
complications.

## 2021-06-08 IMAGING — MG MM BREAST LOCALIZATION CLIP
4 series · 4 of 12 positions shown · non-contrast
Comparison: Previous exam(s).

CLINICAL DATA: Status post right breast stereotactic biopsy.

EXAM:
3D DIAGNOSTIC RIGHT MAMMOGRAM POST STEREOTACTIC BIOPSY

[R CC synth-2D]
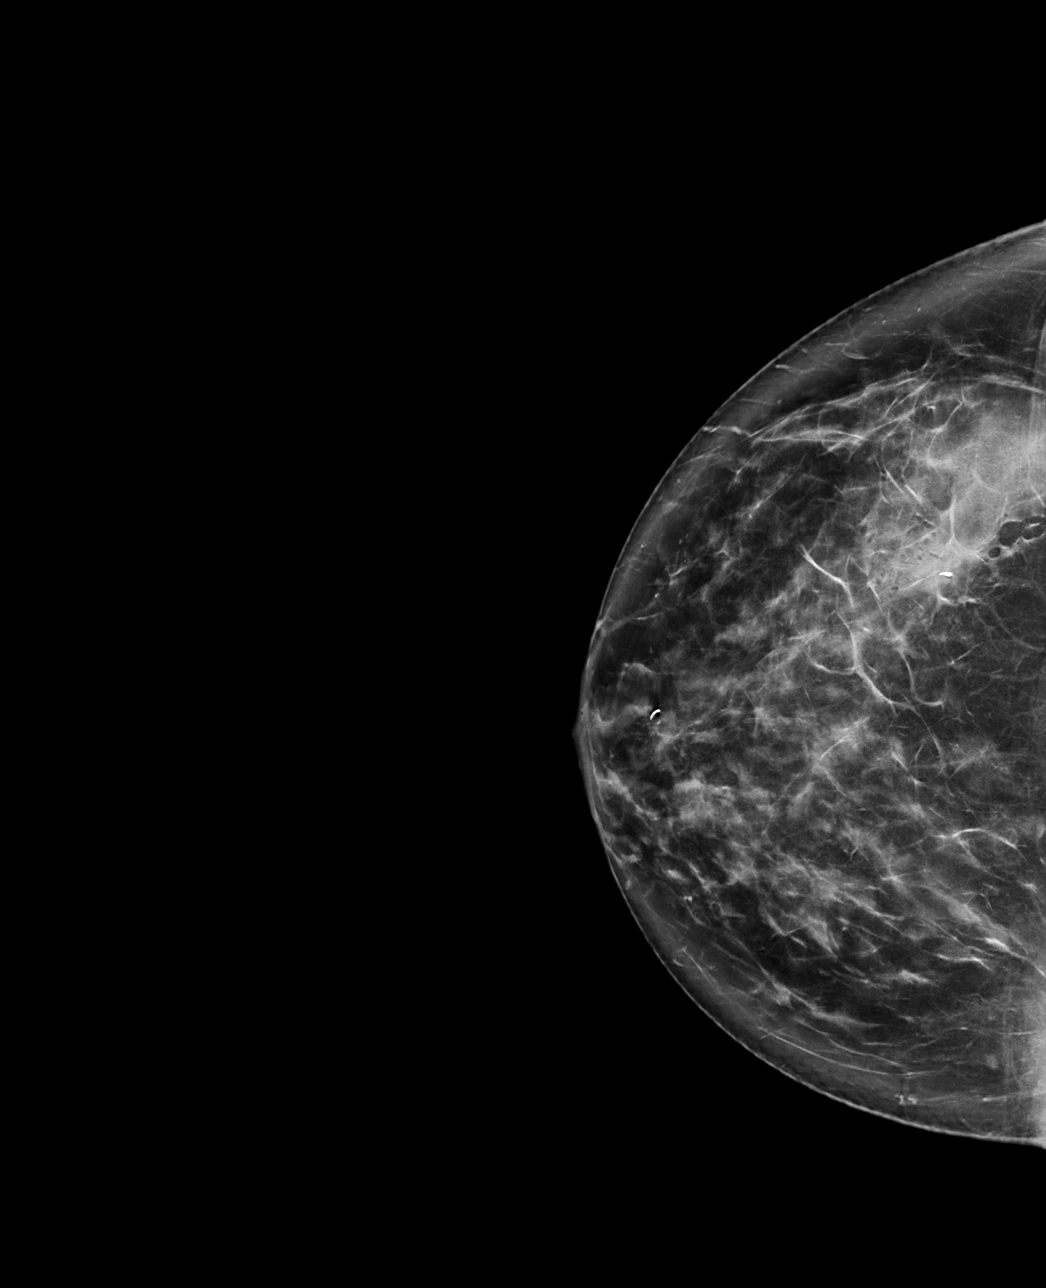

[R ML synth-2D]
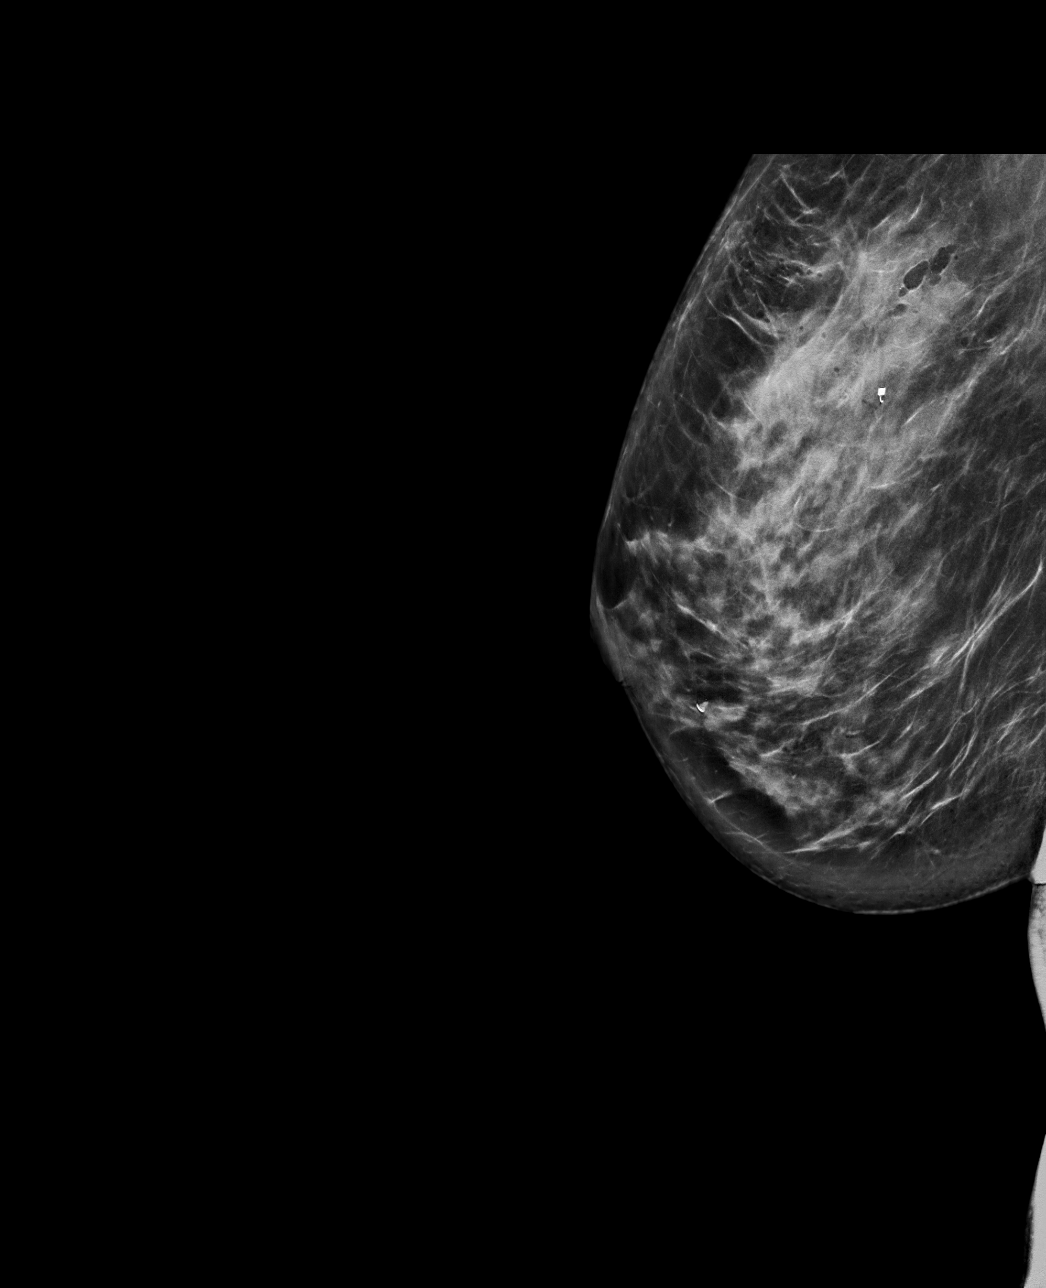

[R ML tomo · tomo slice 43/86.0]
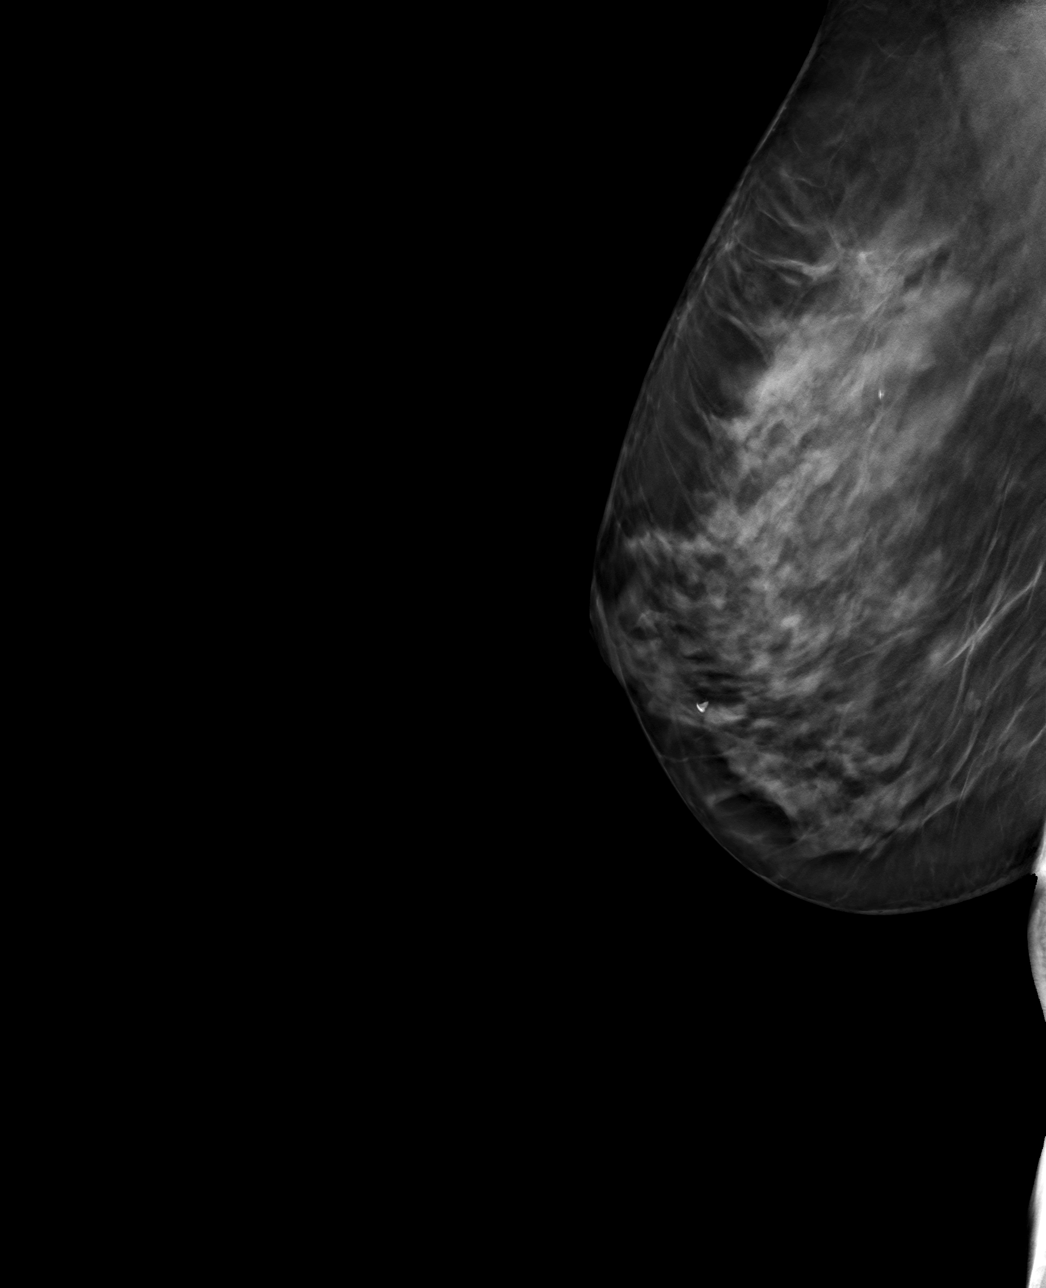

[R CC tomo · tomo slice 39/78.0]
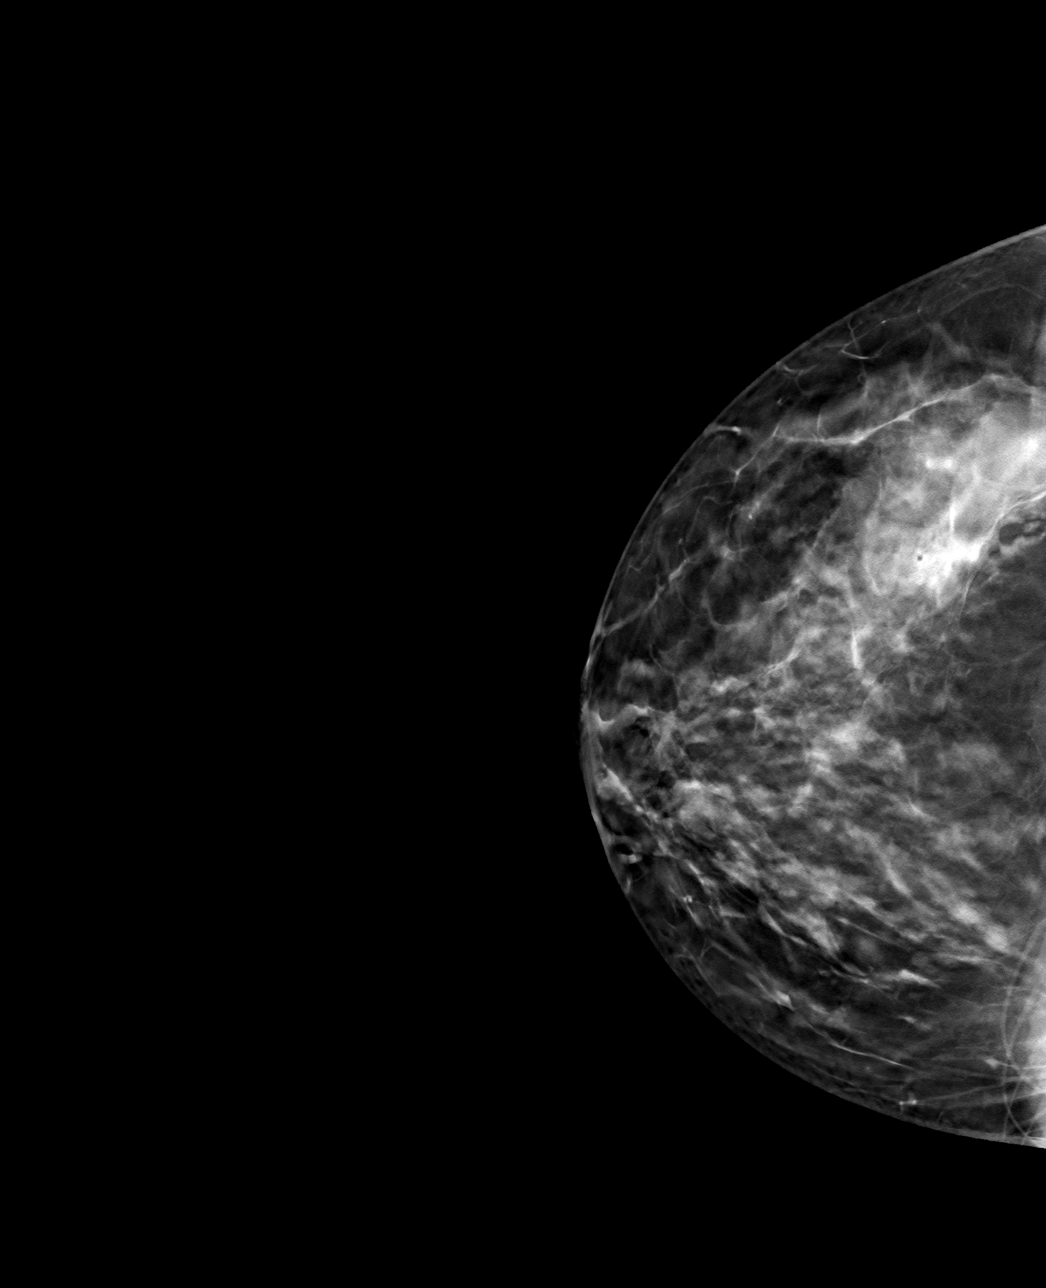

[4 of 12 positions shown; findings below may reference images not displayed]

FINDINGS: 3D Mammographic images were obtained following stereotactic guided
biopsy of the right breast. The biopsy marking clip is in expected
position at the site of biopsy.
IMPRESSION: Appropriate positioning of the coil shaped biopsy marking clip at
the site of biopsy in the upper outer right breast.

Final Assessment: Post Procedure Mammograms for Marker Placement

## 2021-06-14 DIAGNOSIS — N840 Polyp of corpus uteri: Secondary | ICD-10-CM | POA: Diagnosis not present

## 2021-06-23 ENCOUNTER — Other Ambulatory Visit: Payer: Self-pay | Admitting: General Surgery

## 2021-06-23 DIAGNOSIS — D0501 Lobular carcinoma in situ of right breast: Secondary | ICD-10-CM | POA: Insufficient documentation

## 2021-06-24 DIAGNOSIS — D0501 Lobular carcinoma in situ of right breast: Secondary | ICD-10-CM | POA: Diagnosis not present

## 2021-06-24 DIAGNOSIS — Z8041 Family history of malignant neoplasm of ovary: Secondary | ICD-10-CM | POA: Diagnosis not present

## 2021-06-28 ENCOUNTER — Other Ambulatory Visit: Payer: Self-pay | Admitting: General Surgery

## 2021-06-28 DIAGNOSIS — Z1239 Encounter for other screening for malignant neoplasm of breast: Secondary | ICD-10-CM

## 2021-06-28 DIAGNOSIS — D0501 Lobular carcinoma in situ of right breast: Secondary | ICD-10-CM

## 2021-08-02 ENCOUNTER — Ambulatory Visit
Admission: RE | Admit: 2021-08-02 | Discharge: 2021-08-02 | Disposition: A | Discharge status: Home / Self Care | Payer: BC Managed Care – PPO | Source: Ambulatory Visit | Attending: General Surgery | Admitting: General Surgery

## 2021-08-02 ENCOUNTER — Other Ambulatory Visit: Payer: Self-pay

## 2021-08-02 DIAGNOSIS — Z1239 Encounter for other screening for malignant neoplasm of breast: Secondary | ICD-10-CM

## 2021-08-02 DIAGNOSIS — D0501 Lobular carcinoma in situ of right breast: Secondary | ICD-10-CM | POA: Diagnosis not present

## 2021-08-02 IMAGING — MR MR BREAST BILAT WO/W CM
8 of 12 series · 32 of 48 positions shown · IV contrast (8mL gadavist)
Comparison: Mammography [DATE] and [DATE]

CLINICAL DATA: Recent biopsy demonstrated lobular neoplasia/lobular
carcinoma in-situ.

EXAM:
BILATERAL BREAST MRI WITH AND WITHOUT CONTRAST
TECHNIQUE: Multiplanar, multisequence MR images of both breasts were obtained
prior to and following the intravenous administration of 8 ml of
Gadavist

[Series 2: t2_tirm_tra ipat (a-p) · axial · 3.0mm · 0.70mm/px · 1 of 51 slices shown]
[im 1/51]
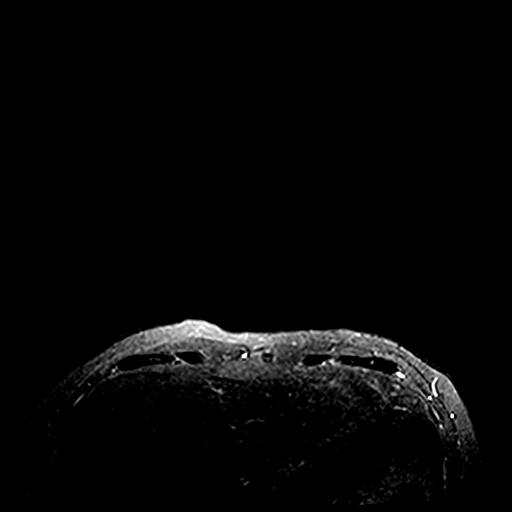

[Series 3: fl3d pre-cm no · axial · non-contrast · 1.2mm · 0.94mm/px · z∈[-67,+105]mm · 5 of 144 slices shown]
[im 1/144]
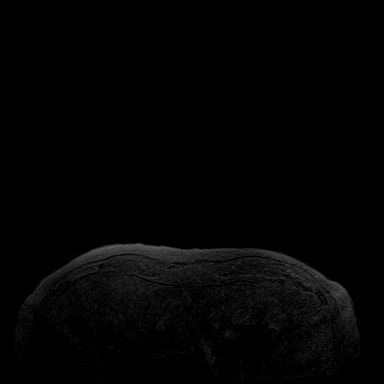
[im 36/144]
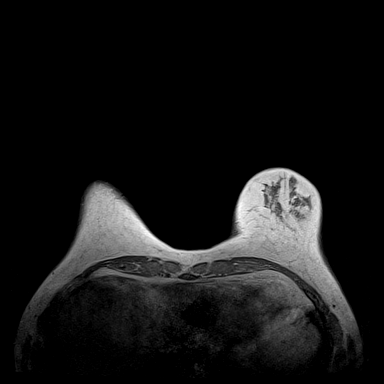
[im 72/144]
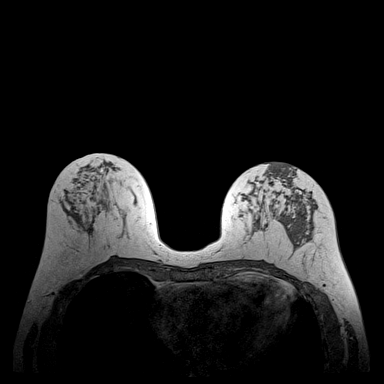
[im 108/144]
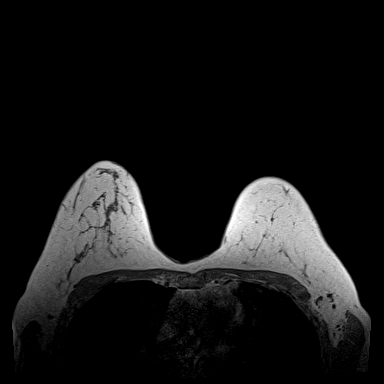
[im 144/144]
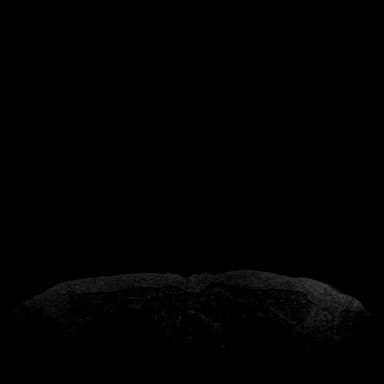

[Series 4: fl3d pre-cm · axial · non-contrast · 1.2mm · 0.94mm/px · z∈[-67,+105]mm · 5 of 144 slices shown]
[im 1/144]
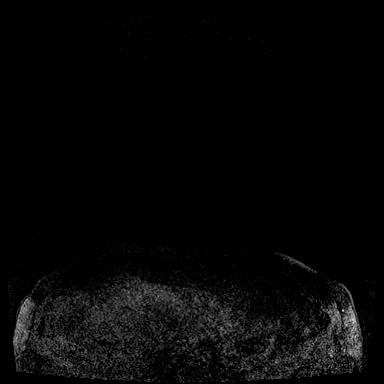
[im 36/144]
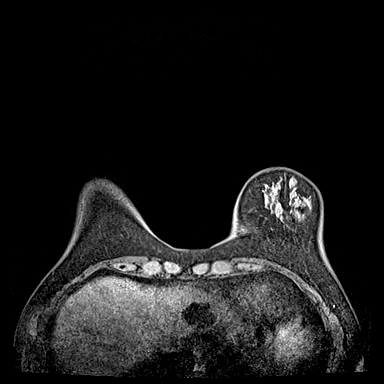
[im 72/144]
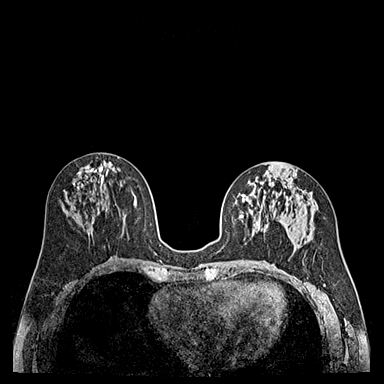
[im 108/144]
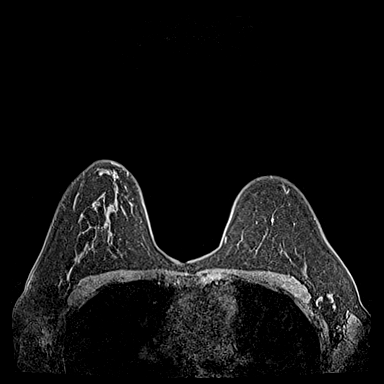
[im 144/144]
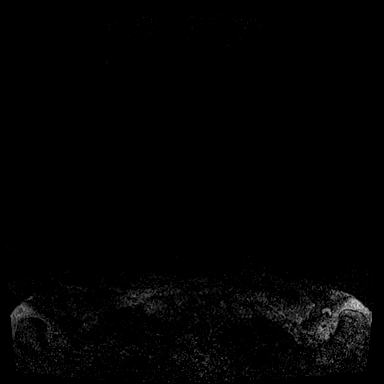

[Series 5: fl3d post-cm 20 · axial · 1.2mm · 0.94mm/px · z∈[-67,+105]mm · 5 of 144 slices shown (1 of 3)]
[im 1/144]
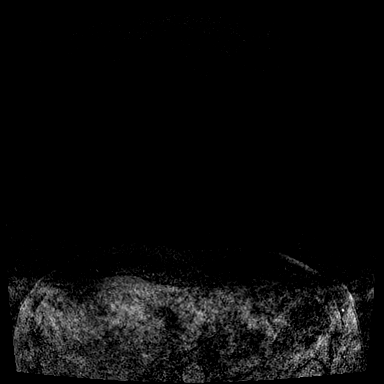
[im 36/144]
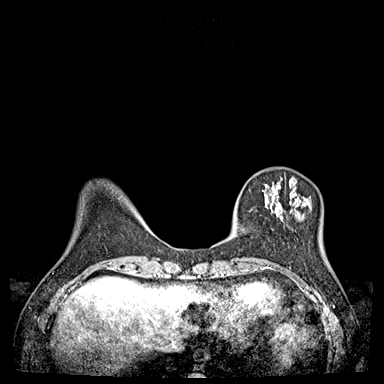
[im 72/144]
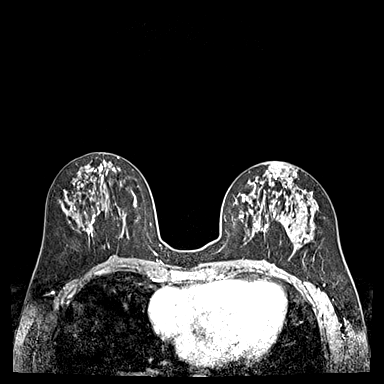
[im 108/144]
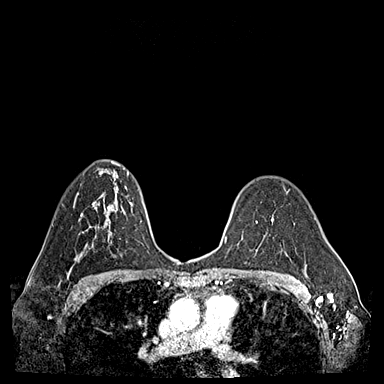
[im 144/144]
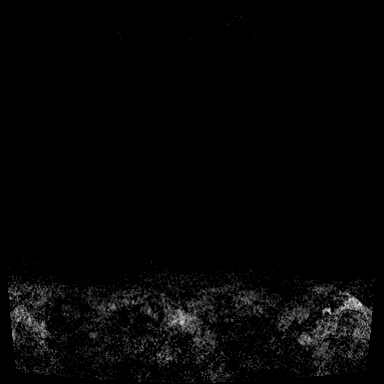

[Series 6: fl3d post-cm 20 · axial · 1.2mm · 0.94mm/px · z∈[-67,+105]mm · 5 of 144 slices shown (2 of 3)]
[im 1/144]
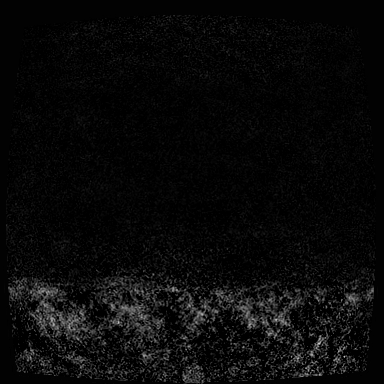
[im 36/144]
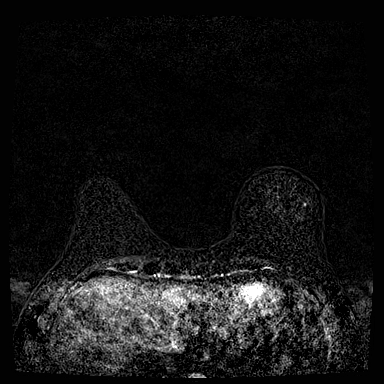
[im 72/144]
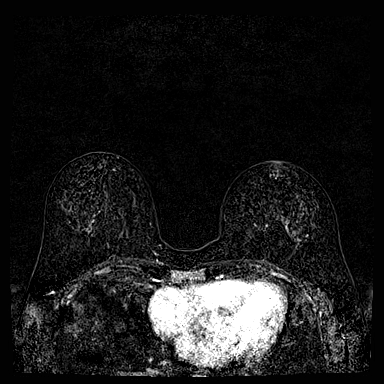
[im 108/144]
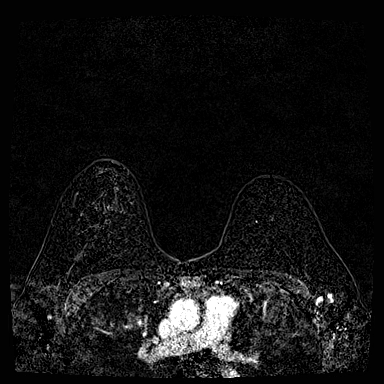
[im 144/144]
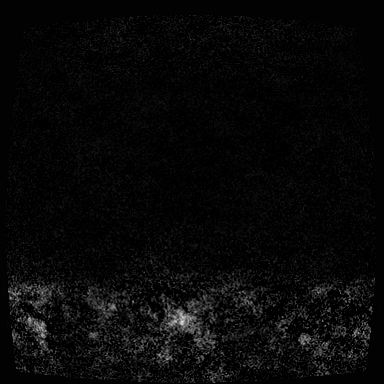

[Series 7: fl3d post-cm 20 · axial · 172.8mm · 0.94mm/px · 1 of 1 slices shown (3 of 3)]
[im 1/1]
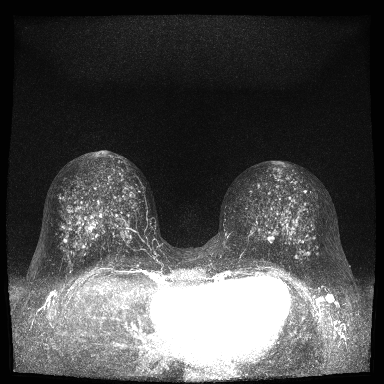

[Series 8: fl3d post-cm 3 · axial · 1.2mm · 0.94mm/px · z∈[-67,+105]mm · 6 of 144 slices shown (1 of 2)]
[im 1/144]
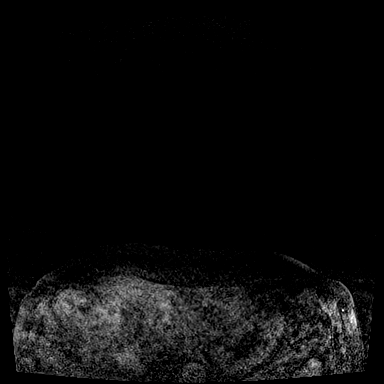
[im 29/144]
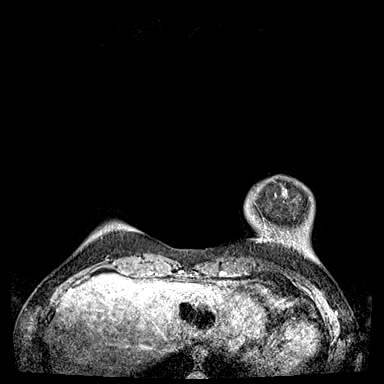
[im 58/144]
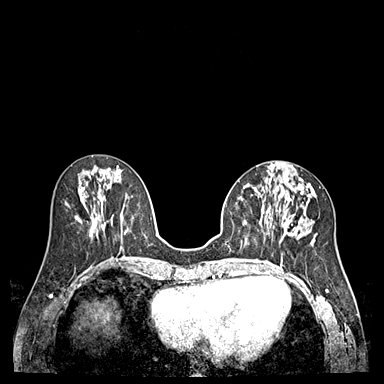
[im 86/144]
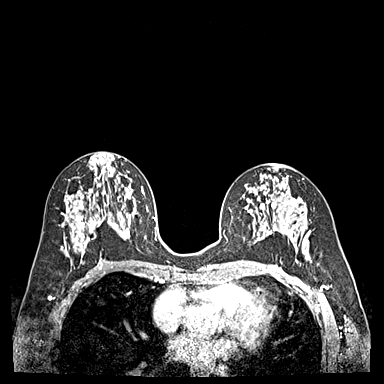
[im 115/144]
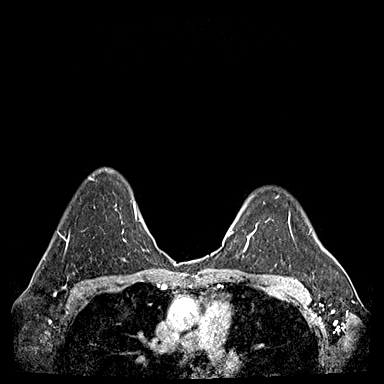
[im 144/144]
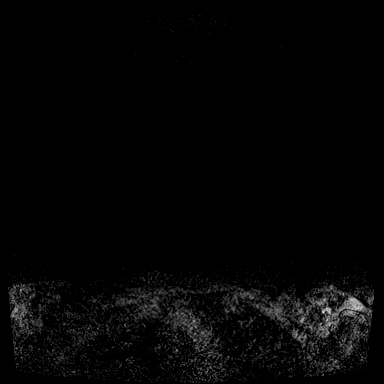

[Series 9: fl3d post-cm 3 · axial · 1.2mm · 0.94mm/px · z∈[-67,+35]mm · 4 of 144 slices shown (2 of 2)]
[im 1/144]
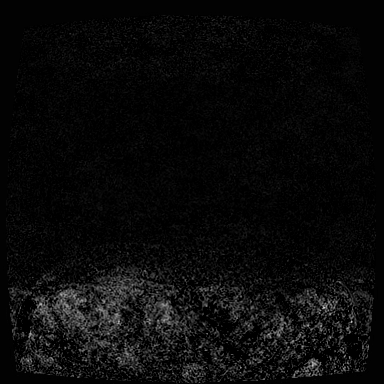
[im 29/144]
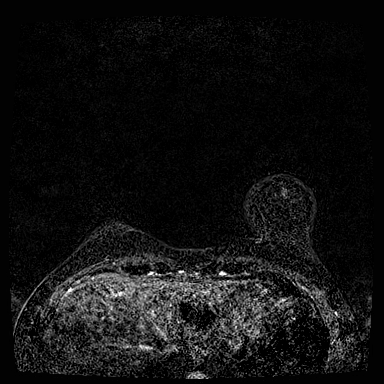
[im 58/144]
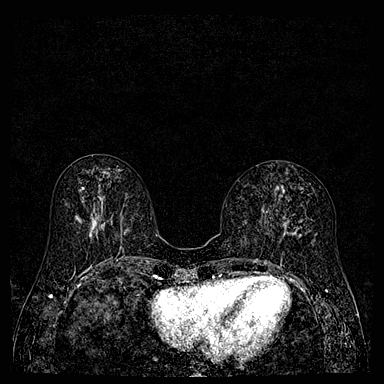
[im 86/144]
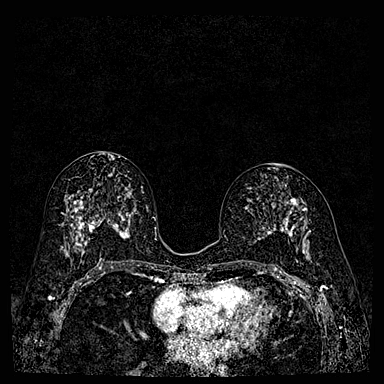

[32 of 48 positions shown; findings below may reference images not displayed]

Three-dimensional MR images were rendered by post-processing of the
original MR data on an independent workstation. The
three-dimensional MR images were interpreted, and findings are
reported in the following complete MRI report for this study. Three
dimensional images were evaluated at the independent interpreting
workstation using the DynaCAD thin client.
FINDINGS: Breast composition: c. Heterogeneous fibroglandular tissue.

Background parenchymal enhancement: Marked

Right breast: Marked background enhancement is identified. No
suspicious enhancement identified in the right breast. Specifically,
no suspicious enhancement adjacent to the biopsy clip in the
slightly lateral right breast.

Left breast: The patient's known benign mass in the inferior left
breast has been seen mammographically since at least [Q9] and is of
no significance. There is marked background enhancement in the left
breast of no significance. There is linear non mass enhancement in
the lower outer left breast on subtraction image 78 spanning 1.8 cm.

Lymph nodes: No abnormal appearing lymph nodes.

Ancillary findings: Cysts are identified in the liver, of no
significance.
IMPRESSION: 1. 1.8 cm of linear non mass enhancement in the lower outer left
breast on subtraction image 78.
2. Marked background enhancement in both breasts limiting
evaluation. No other evidence of malignancy in either breast.
Specifically, no abnormal enhancement in the region of the patient's
recent right breast biopsy.

RECOMMENDATION:
Recommend MRI guided biopsy of the 1.8 cm of linear non mass
enhancement in the lower outer left breast.

BI-RADS CATEGORY  4: Suspicious.

## 2021-08-02 MED ORDER — GADOBUTROL 1 MMOL/ML IV SOLN
8.0000 mL | Freq: Once | INTRAVENOUS | Status: AC | PRN
  Administered 2021-08-02: 8 mL via INTRAVENOUS

## 2021-08-05 ENCOUNTER — Other Ambulatory Visit: Payer: Self-pay | Admitting: General Surgery

## 2021-08-05 DIAGNOSIS — R9389 Abnormal findings on diagnostic imaging of other specified body structures: Secondary | ICD-10-CM

## 2021-08-18 ENCOUNTER — Other Ambulatory Visit: Payer: BC Managed Care – PPO

## 2021-08-26 ENCOUNTER — Ambulatory Visit
Admission: RE | Admit: 2021-08-26 | Discharge: 2021-08-26 | Disposition: A | Discharge status: Home / Self Care | Payer: BC Managed Care – PPO | Source: Ambulatory Visit | Attending: General Surgery | Admitting: General Surgery

## 2021-08-26 DIAGNOSIS — R928 Other abnormal and inconclusive findings on diagnostic imaging of breast: Secondary | ICD-10-CM | POA: Diagnosis not present

## 2021-08-26 DIAGNOSIS — N6012 Diffuse cystic mastopathy of left breast: Secondary | ICD-10-CM | POA: Diagnosis not present

## 2021-08-26 DIAGNOSIS — R9389 Abnormal findings on diagnostic imaging of other specified body structures: Secondary | ICD-10-CM

## 2021-08-26 IMAGING — MG MM BREAST LOCALIZATION CLIP
4 series · 4 of 12 positions shown · non-contrast
Comparison: Previous exam(s).

CLINICAL DATA: Evaluate post biopsy marker clip placement following
MRI guided core needle biopsy of a linear area of non mass
enhancement in the lateral left breast.

EXAM:
3D DIAGNOSTIC LEFT MAMMOGRAM POST MRI BIOPSY

[L ML synth-2D]
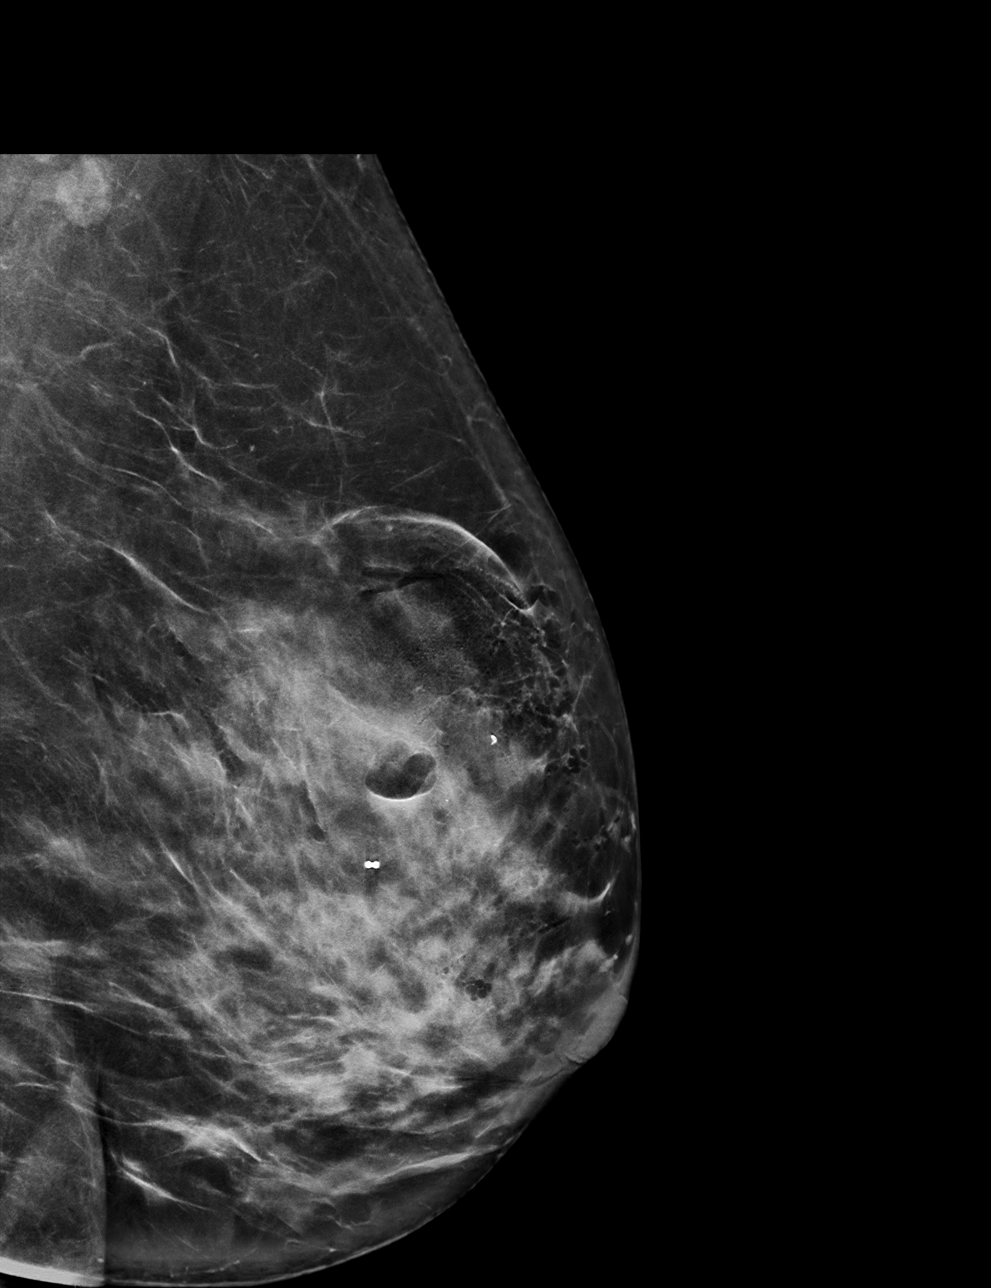

[L CC synth-2D]
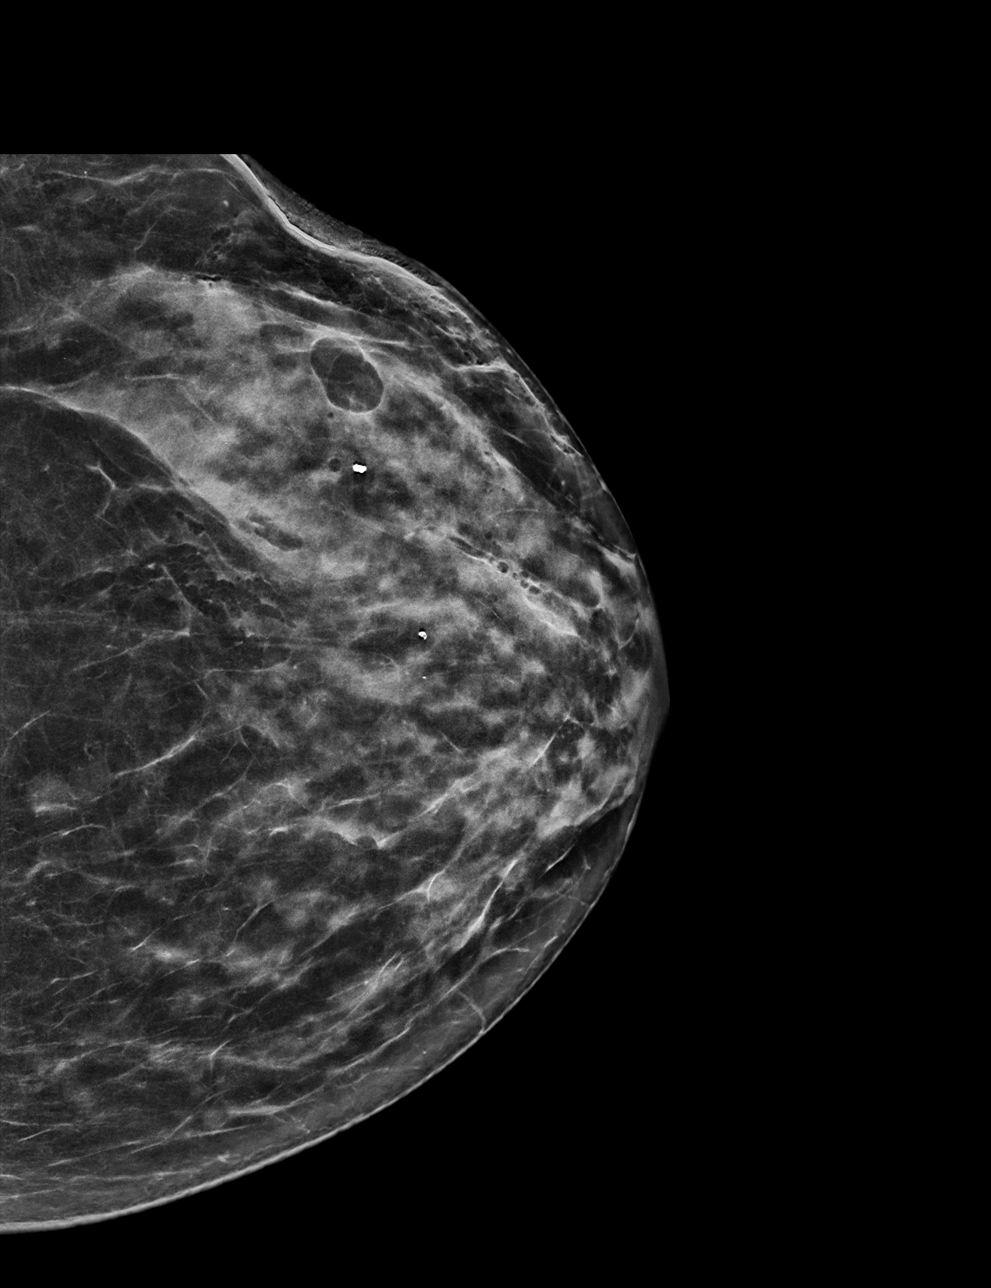

[L ML tomo · tomo slice 37/72.0]
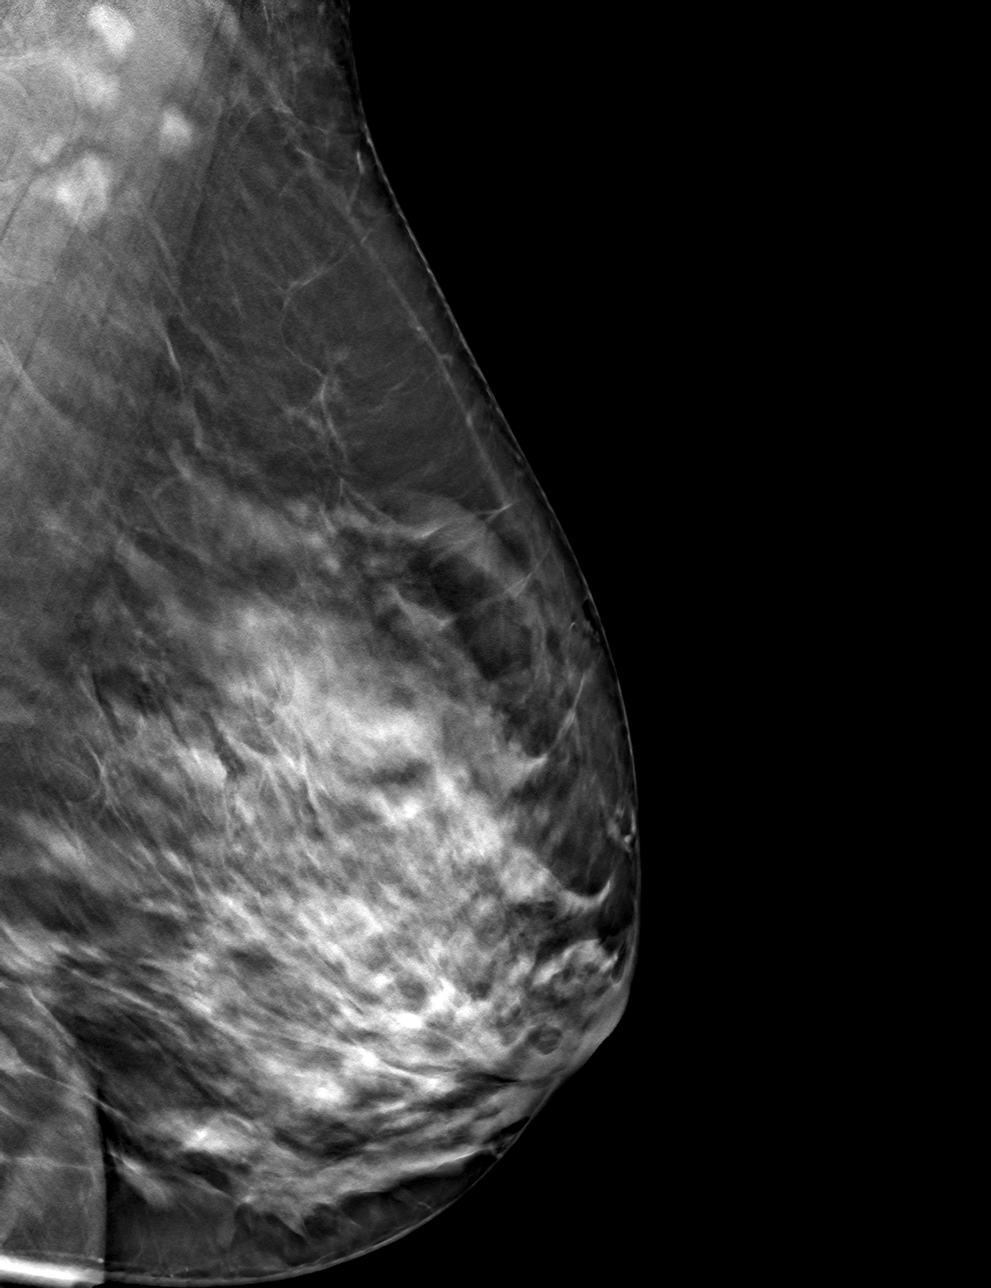

[L CC tomo · tomo slice 32/63.0]
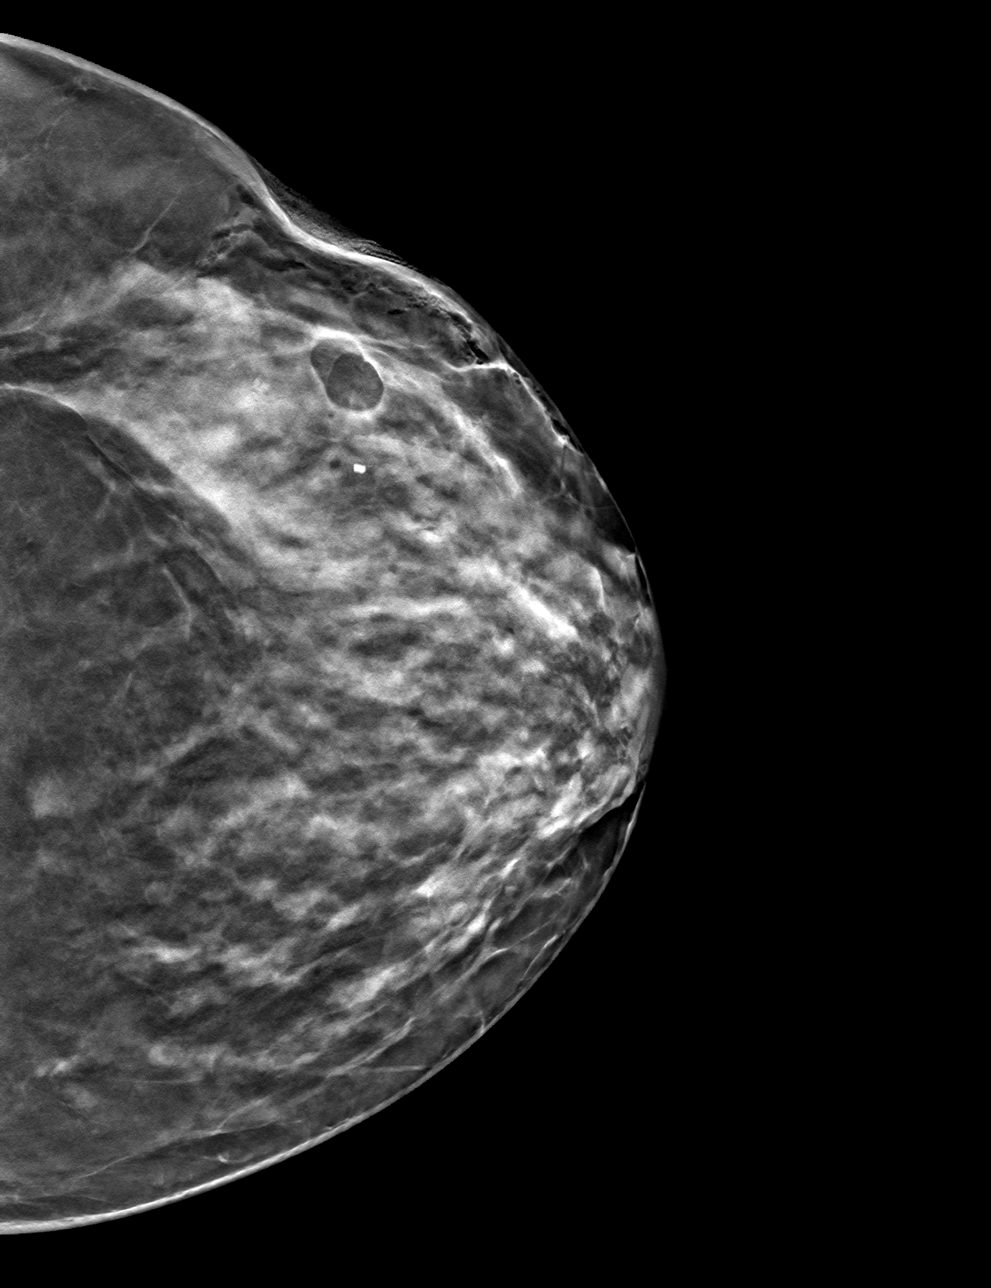

[4 of 12 positions shown; findings below may reference images not displayed]

FINDINGS: 3D Mammographic images were obtained following MRI guided biopsy of
the lateral breast. The biopsy marking clip is in expected position
at the site of biopsy.
IMPRESSION: Appropriate positioning of the barbell shaped biopsy marking clip at
the site of biopsy in the lateral left breast in the expected
location the area of linear non mass enhancement.

Final Assessment: Post Procedure Mammograms for Marker Placement

## 2021-08-26 IMAGING — MR MR BREAST BX W LOC DEV 1ST LESION IMAGE BX SPEC MR GUIDE*L*
6 of 8 series · 31 of 48 positions shown · IV contrast (8ML GADAVIST)
Comparison: Previous exams.
COMPARISON: Previous exams.

Addendum:
CLINICAL DATA: Patient presents for MRI guided core needle biopsy
of a left breast lesion. She was diagnosed with right breast lobular
neoplasia on [DATE] following stereotactic core needle biopsy.

EXAM:
MRI GUIDED CORE NEEDLE BIOPSY OF THE LEFT BREAST
TECHNIQUE: Multiplanar, multisequence MR imaging of the left breast was
performed both before and after administration of intravenous
contrast.
CONTRAST:  8 mL of Gadavist

[Series 2: fiducial unilateral · sagittal · 2.0mm · 1.33mm/px · 1 of 52 slices shown]
[im 1/52]
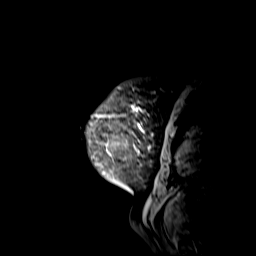

[Series 3: dynamic pre · axial · non-contrast · 1.3mm · 0.73mm/px · z∈[-96,+111]mm · 6 of 160 slices shown]
[im 1/160]
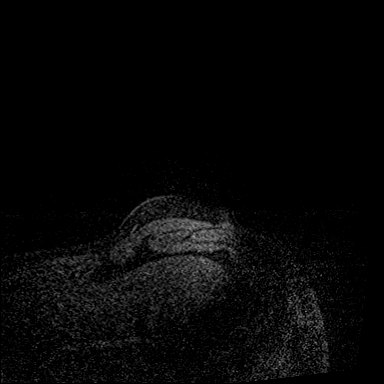
[im 32/160]
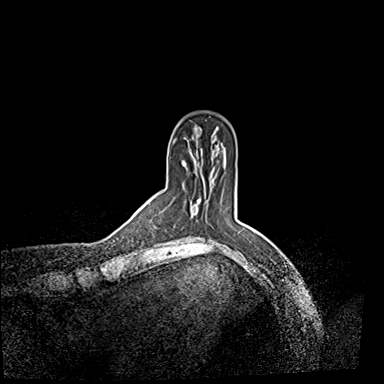
[im 64/160]
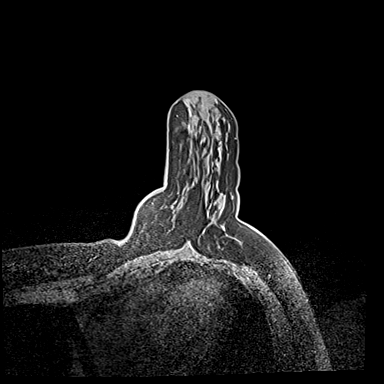
[im 96/160]
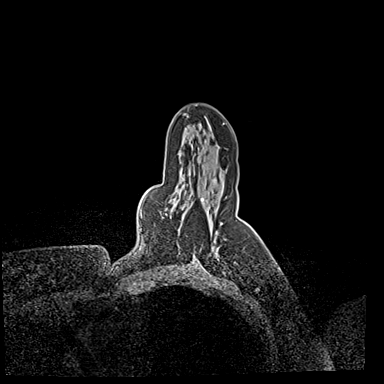
[im 128/160]
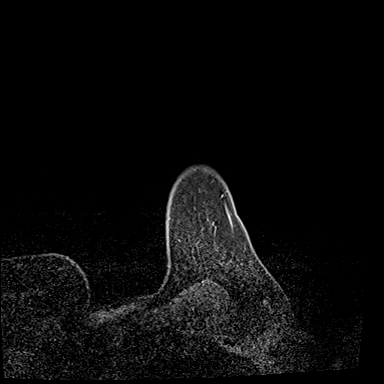
[im 160/160]
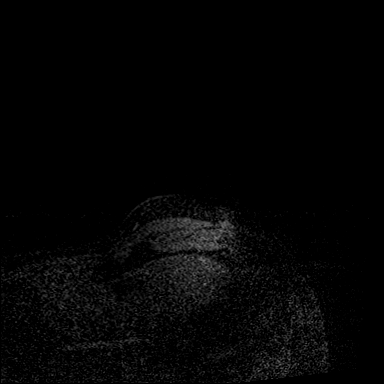

[Series 4: dynamic post 20 · axial · 1.3mm · 0.73mm/px · z∈[-96,+111]mm · 6 of 160 slices shown (1 of 2)]
[im 1/160]
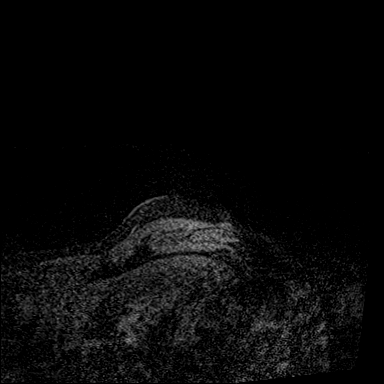
[im 32/160]
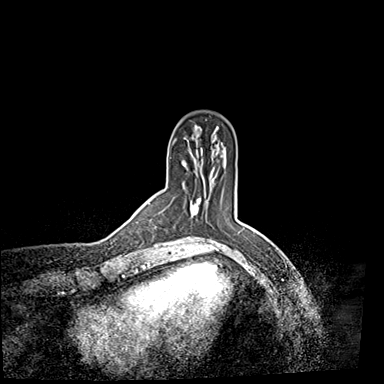
[im 64/160]
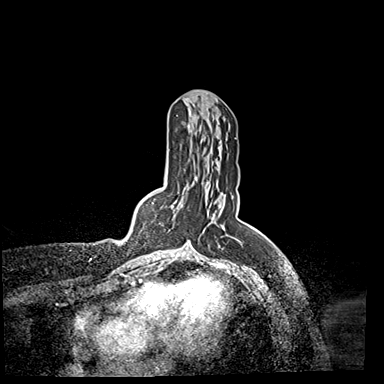
[im 96/160]
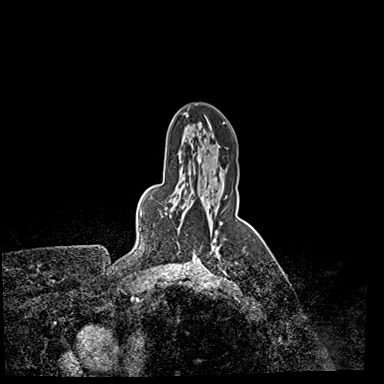
[im 128/160]
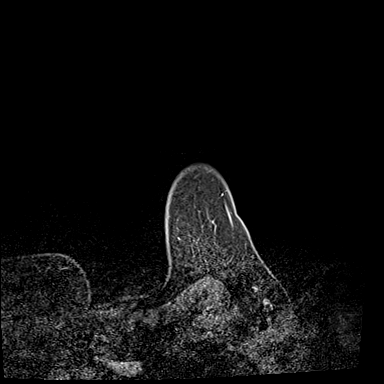
[im 160/160]
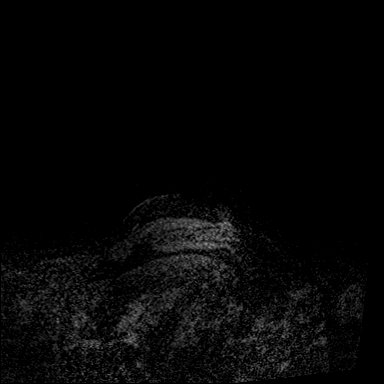

[Series 5: dynamic post 20 · axial · 1.3mm · 0.73mm/px · z∈[-96,+111]mm · 7 of 160 slices shown (2 of 2)]
[im 1/160]
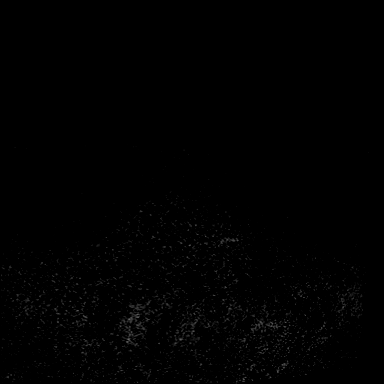
[im 27/160]
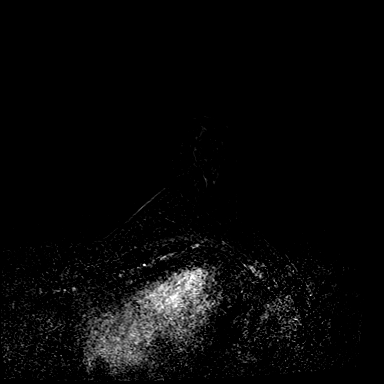
[im 54/160]
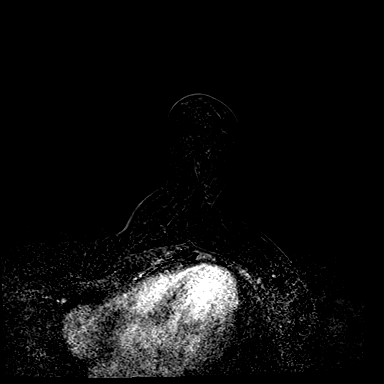
[im 80/160]
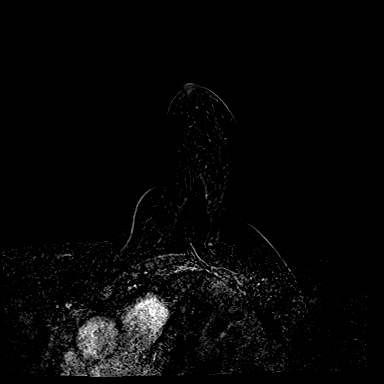
[im 107/160]
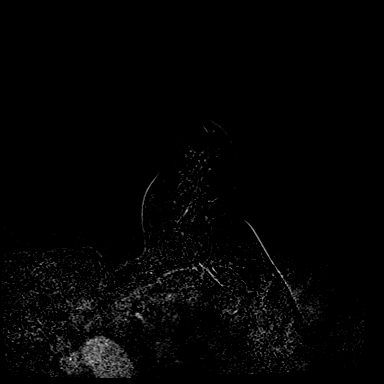
[im 133/160]
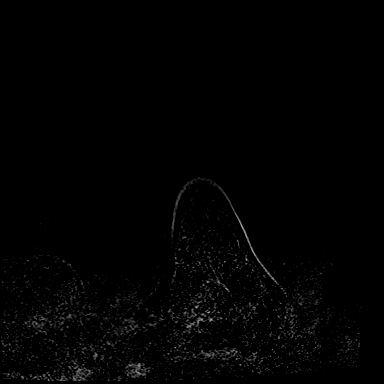
[im 160/160]
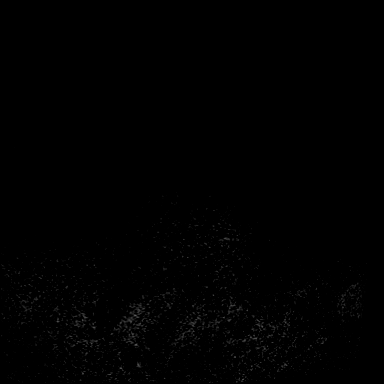

[Series 6: dynamic post 3 · axial · 1.3mm · 0.73mm/px · z∈[-96,+111]mm · 7 of 160 slices shown (1 of 2)]
[im 1/160]
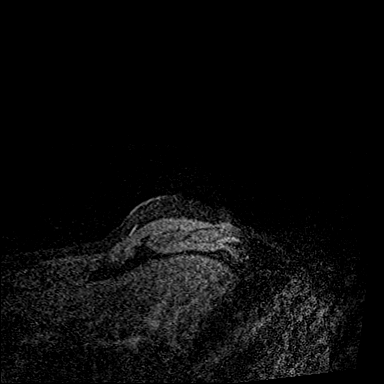
[im 27/160]
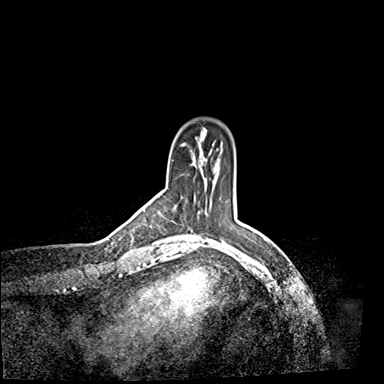
[im 54/160]
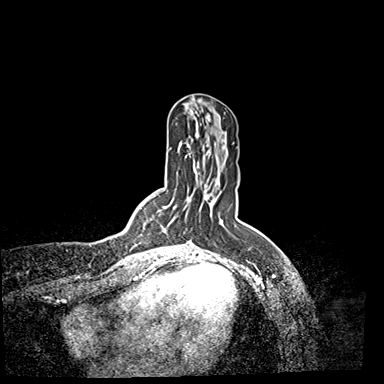
[im 80/160]
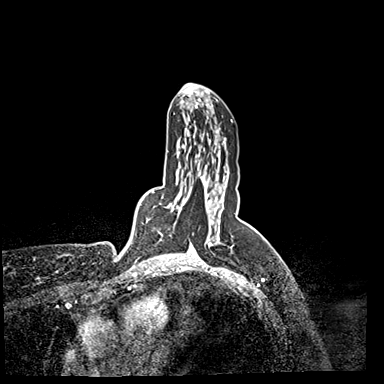
[im 107/160]
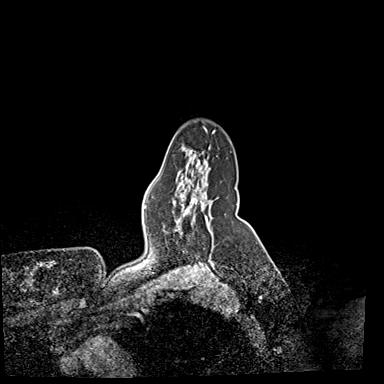
[im 133/160]
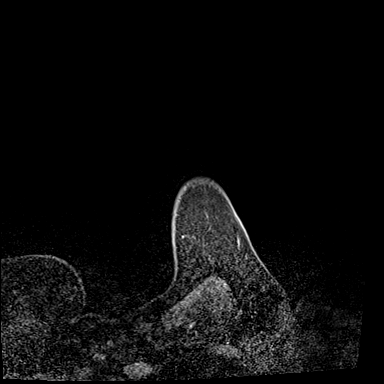
[im 160/160]
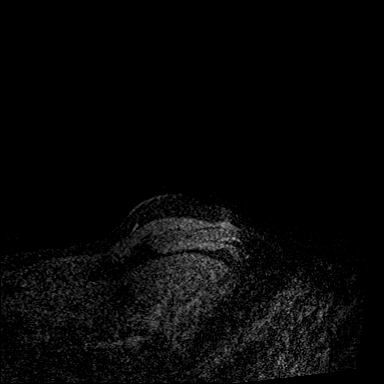

[Series 7: dynamic post 3 · axial · 1.3mm · 0.73mm/px · z∈[-96,+7]mm · 4 of 160 slices shown (2 of 2)]
[im 1/160]
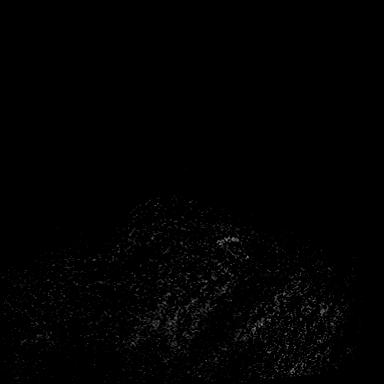
[im 27/160]
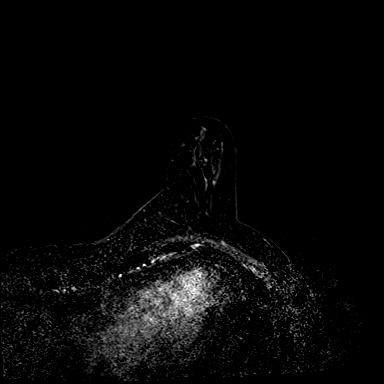
[im 54/160]
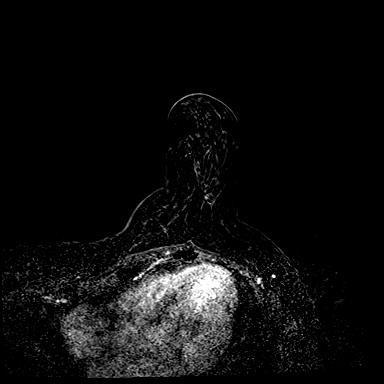
[im 80/160]
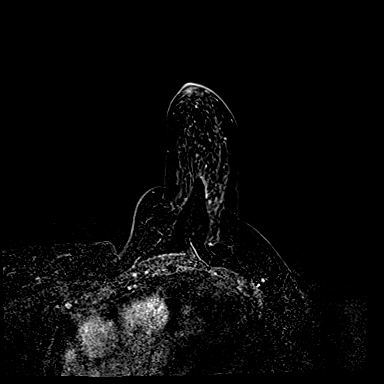

[31 of 48 positions shown; findings below may reference images not displayed]

FINDINGS: I met with the patient, and we discussed the procedure of MRI guided
biopsy, including risks, benefits, and alternatives. Specifically,
we discussed the risks of infection, bleeding, tissue injury, clip
migration, and inadequate sampling. Informed, written consent was
given. The usual time out protocol was performed immediately prior
to the procedure.

Using sterile technique, 1% Lidocaine, MRI guidance, and a 9 gauge
vacuum assisted device, biopsy was performed of the linear area of
non mass enhancement in the lateral left breast using a lateral
approach. At the conclusion of the procedure, a barbell shaped
tissue marker clip was deployed into the biopsy cavity. Follow-up
2-view mammogram was performed and dictated separately.
IMPRESSION: MRI guided biopsy of a 1.8 cm focus of linear non mass enhancement
in the left breast. No apparent complications.

ADDENDUM:
Pathology revealed PROLIFERATIVE FIBROCYSTIC CHANGE WITH CYSTIC
APOCRINE METAPLASIA, COLUMNAR CELL CHANGE AND FOCAL USUAL DUCT
HYPERPLASIA, ATYPICAL LOBULAR HYPERPLASIA, (ALH),
MICROCALCIFICATIONS IDENTIFIED of the LEFT breast, 3:30 o'clock,
middle depth, (barbell clip). This was found to be concordant by Dr.
LINNES with surgical consultation and High Risk Screening
recommended.

Pathology results were discussed with the patient by telephone. The
patient reported doing well after the biopsy with tenderness,
bleeding and bruising at the site. Post biopsy instructions and care
were reviewed and questions were answered. The patient was
encouraged to call The [REDACTED] for any
additional concerns. My direct number was provided.

Follow up protocol for patients with lobular neoplasia being
observed will have diagnostic mammograms for two years (6 month
follow up, 6 month follow up, 12 month follow up), then returned to
annual screening mammograms.

The patient was instructed to return for a bilateral breast MRI in 6
months, per protocol, if not excised.

NOTE: The patient would prefer to have the LEFT breast ALH excised
at the time of her RIGHT breast LOBULAR NEOPLASIA (LOBULAR CARCINOMA
IN-SITU) excision.

Dr. LINNES was notified of biopsy results and the patient's
request via [REDACTED] message on [DATE].

Pathology results reported by LINNES, RN on [DATE].

*** End of Addendum ***
FINDINGS: I met with the patient, and we discussed the procedure of MRI guided
biopsy, including risks, benefits, and alternatives. Specifically,
we discussed the risks of infection, bleeding, tissue injury, clip
migration, and inadequate sampling. Informed, written consent was
given. The usual time out protocol was performed immediately prior
to the procedure.

Using sterile technique, 1% Lidocaine, MRI guidance, and a 9 gauge
vacuum assisted device, biopsy was performed of the linear area of
non mass enhancement in the lateral left breast using a lateral
approach. At the conclusion of the procedure, a barbell shaped
tissue marker clip was deployed into the biopsy cavity. Follow-up
2-view mammogram was performed and dictated separately.
IMPRESSION: MRI guided biopsy of a 1.8 cm focus of linear non mass enhancement
in the left breast. No apparent complications.

## 2021-08-26 MED ORDER — GADOBUTROL 1 MMOL/ML IV SOLN
8.0000 mL | Freq: Once | INTRAVENOUS | Status: AC | PRN
  Administered 2021-08-26: 8 mL via INTRAVENOUS

## 2021-08-30 ENCOUNTER — Other Ambulatory Visit: Payer: Self-pay | Admitting: General Surgery

## 2021-08-30 DIAGNOSIS — N6092 Unspecified benign mammary dysplasia of left breast: Secondary | ICD-10-CM

## 2021-08-31 ENCOUNTER — Other Ambulatory Visit: Payer: Self-pay | Admitting: General Surgery

## 2021-08-31 ENCOUNTER — Ambulatory Visit: Payer: BC Managed Care – PPO | Admitting: Gastroenterology

## 2021-08-31 ENCOUNTER — Telehealth: Payer: Self-pay | Admitting: Gastroenterology

## 2021-08-31 DIAGNOSIS — N6092 Unspecified benign mammary dysplasia of left breast: Secondary | ICD-10-CM

## 2021-08-31 DIAGNOSIS — D0501 Lobular carcinoma in situ of right breast: Secondary | ICD-10-CM

## 2021-08-31 NOTE — Telephone Encounter (Signed)
Good Morning Dr. Rush Landmark, ? ?Patient called and stated she needed to cancel appointment with you today at 3:30 due to waking up sick. ? ?Patient was rescheduled with you on 6/2 at 2:50 ?

## 2021-08-31 NOTE — Telephone Encounter (Signed)
Thank you for update. ?She may reschedule at her convenience. ?GM ?

## 2021-09-02 ENCOUNTER — Encounter: Payer: Self-pay | Admitting: Internal Medicine

## 2021-09-07 NOTE — Progress Notes (Signed)
Surgical Instructions ? ? ? Your procedure is scheduled on Thursday, May 4th. ? Report to [facility] Main Entrance "A" at 10:30 A.M., then check in with the Admitting office. ? Call this number if you have problems the morning of surgery: ? [phone] ? ? If you have any questions prior to your surgery date call [phone]: Open Monday-Friday 8am-4pm ? ? ? Remember: ? Do not eat after midnight the night before your surgery ? ?You may drink clear liquids until 9:30 AM the morning of your surgery.   ?Clear liquids allowed are: Water, Non-Citrus Juices (without pulp), Carbonated Beverages, Clear Tea, Black Coffee ONLY (NO MILK, CREAM OR POWDERED CREAMER of any kind), and Gatorade ?  ? Take these medicines the morning of surgery with A SIP OF WATER:  ? Paroxetine (Paxil) ? ?As of today, STOP taking any Aspirin (unless otherwise instructed by your surgeon) Aleve, Naproxen, Ibuprofen, Motrin, Advil, Goody's, BC's, all herbal medications, fish oil, and all vitamins. ? ?         DAY OF SURGERY: ?Do not wear jewelry or makeup ?Do not wear lotions, powders, perfumes, or deodorant. ?Do not shave 48 hours prior to surgery.   ?Do not bring valuables to the hospital. ?Do not wear nail polish, gel polish, artificial nails, or any other type of covering on natural nails (fingers and toes) ?If you have artificial nails or gel coating that need to be removed by a nail salon, please have this removed prior to surgery. Artificial nails or gel coating may interfere with anesthesia's ability to adequately monitor your vital signs. ? ?[facility] is not responsible for any belongings or valuables. .  ? ?Do NOT Smoke (Tobacco/Vaping)  24 hours prior to your procedure ? ?If you use a CPAP at night, you may bring your mask for your overnight stay. ?  ?Contacts, glasses, hearing aids, dentures or partials may not be worn into surgery, please bring cases for these belongings ?  ?For patients admitted to the hospital, discharge time will be  determined by your treatment team. ?  ?Patients discharged the day of surgery will not be allowed to drive home, and someone needs to stay with them for 24 hours. ? ? ?SURGICAL WAITING ROOM VISITATION ?Patients having surgery or a procedure in a hospital may have two support people. ?Children under the age of 90 must have an adult with them who is not the patient. ?They may stay in the waiting area during the procedure and may switch out with other visitors. If the patient needs to stay at the hospital during part of their recovery, the visitor guidelines for inpatient rooms apply. ? ?Please refer to the [facility] website for the visitor guidelines for Inpatients (after your surgery is over and you are in a regular room).  ? ? ?Special instructions:   ? ?Oral Hygiene is also important to reduce your risk of infection.  Remember - BRUSH YOUR TEETH THE MORNING OF SURGERY WITH YOUR REGULAR TOOTHPASTE ? ? ?[facility]- Preparing For Surgery ? ?Before surgery, you can play an important role. Because skin is not sterile, your skin needs to be as free of germs as possible. You can reduce the number of germs on your skin by washing with CHG (chlorahexidine gluconate) Soap before surgery.  CHG is an antiseptic cleaner which kills germs and bonds with the skin to continue killing germs even after washing.   ? ? ?Please do not use if you have an allergy to CHG or  antibacterial soaps. If your skin becomes reddened/irritated stop using the CHG.  ?Do not shave (including legs and underarms) for at least 48 hours prior to first CHG shower. It is OK to shave your face. ? ?Please follow these instructions carefully. ?  ? ? Shower the NIGHT BEFORE SURGERY and the MORNING OF SURGERY with CHG Soap.  ? If you chose to wash your hair, wash your hair first as usual with your normal shampoo. After you shampoo, rinse your hair and body thoroughly to remove the shampoo.  Then [employer] and genitals (private parts) with your normal soap and  rinse thoroughly to remove soap. ? ?After that Use CHG Soap as you would any other liquid soap. You can apply CHG directly to the skin and wash gently with a scrungie or a clean washcloth.  ? ?Apply the CHG Soap to your body ONLY FROM THE NECK DOWN.  Do not use on open wounds or open sores. Avoid contact with your eyes, ears, mouth and genitals (private parts). Wash Face and genitals (private parts)  with your normal soap.  ? ?Wash thoroughly, paying special attention to the area where your surgery will be performed. ? ?Thoroughly rinse your body with warm water from the neck down. ? ?DO NOT shower/wash with your normal soap after using and rinsing off the CHG Soap. ? ?Pat yourself dry with a CLEAN TOWEL. ? ?Wear CLEAN PAJAMAS to bed the night before surgery ? ?Place CLEAN SHEETS on your bed the night before your surgery ? ?DO NOT SLEEP WITH PETS. ? ? ?Day of Surgery: ? ?Take a shower with CHG soap. ?Wear Clean/Comfortable clothing the morning of surgery ?Do not apply any deodorants/lotions.   ?Remember to brush your teeth WITH YOUR REGULAR TOOTHPASTE. ? ?Please read over the following fact sheets that you were given.  ? ?

## 2021-09-08 ENCOUNTER — Other Ambulatory Visit: Payer: Self-pay

## 2021-09-08 ENCOUNTER — Encounter (HOSPITAL_COMMUNITY)
Admission: RE | Admit: 2021-09-08 | Discharge: 2021-09-08 | Disposition: A | Discharge status: Home / Self Care | Payer: BC Managed Care – PPO | Source: Ambulatory Visit | Attending: General Surgery | Admitting: General Surgery

## 2021-09-08 ENCOUNTER — Encounter (HOSPITAL_COMMUNITY): Payer: Self-pay

## 2021-09-08 VITALS — BP 136/81 | HR 100 | Temp 98.1°F | Resp 17 | Ht 65.0 in | Wt 168.1 lb

## 2021-09-08 DIAGNOSIS — Z01818 Encounter for other preprocedural examination: Secondary | ICD-10-CM | POA: Diagnosis not present

## 2021-09-08 HISTORY — DX: Gastro-esophageal reflux disease without esophagitis: K21.9

## 2021-09-08 HISTORY — DX: Essential (primary) hypertension: I10

## 2021-09-08 HISTORY — DX: Anemia, unspecified: D64.9

## 2021-09-08 LAB — CBC
HCT: 40.2 % (ref 36.0–46.0)
Hemoglobin: 12.7 g/dL (ref 12.0–15.0)
MCH: 25.7 pg — ABNORMAL LOW (ref 26.0–34.0)
MCHC: 31.6 g/dL (ref 30.0–36.0)
MCV: 81.2 fL (ref 80.0–100.0)
Platelets: 444 10*3/uL — ABNORMAL HIGH (ref 150–400)
RBC: 4.95 MIL/uL (ref 3.87–5.11)
RDW: 15.5 % (ref 11.5–15.5)
WBC: 18.1 10*3/uL — ABNORMAL HIGH (ref 4.0–10.5)
nRBC: 0 % (ref 0.0–0.2)

## 2021-09-08 LAB — BASIC METABOLIC PANEL
Anion gap: 6 (ref 5–15)
BUN: 12 mg/dL (ref 6–20)
CO2: 29 mmol/L (ref 22–32)
Calcium: 9.6 mg/dL (ref 8.9–10.3)
Chloride: 103 mmol/L (ref 98–111)
Creatinine, Ser: 0.76 mg/dL (ref 0.44–1.00)
GFR, Estimated: >60 mL/min (ref >60)
Glucose, Bld: 99 mg/dL (ref 70–99)
Potassium: 4.7 mmol/L (ref 3.5–5.1)
Sodium: 138 mmol/L (ref 135–145)

## 2021-09-08 NOTE — Progress Notes (Addendum)
PCP:  Billey Gosling, MD ?Cardiologist:  denies ? ?EKG:  09/08/21 ?CXR:  na ?ECHO: denies ?Stress Test:  denies ?Cardiac Cath:  denies ? ?Fasting Blood Sugar-  na ?Checks Blood Sugar_na__ times a day ? ?OSA/CPAP: No ? ?ASA/Blood Thinner: No ? ?Covid test not needed ? ?Anesthesia Review: Yes, abnormal labs. ? ?Patient denies shortness of breath, fever, cough, and chest pain at PAT appointment. ? ?Patient verbalized understanding of instructions provided today at the PAT appointment.  Patient asked to review instructions at home and day of surgery.   ?

## 2021-09-08 NOTE — Progress Notes (Signed)
Surgical Instructions ? ? ? Your procedure is scheduled on Thursday, May 4th. ? Report to [facility] Main Entrance "A" at 10:30 A.M., then check in with the Admitting office. ? Call this number if you have problems the morning of surgery: ? [phone] ? ? If you have any questions prior to your surgery date call [phone]: Open Monday-Friday 8am-4pm ? ? ? Remember: ? Do not eat after midnight the night before your surgery ? ?You may drink clear liquids until 9:30 AM the morning of your surgery.   ?Clear liquids allowed are: Water, Non-Citrus Juices (without pulp), Carbonated Beverages, Clear Tea, Black Coffee ONLY (NO MILK, CREAM OR POWDERED CREAMER of any kind), and Gatorade ? ?Please complete your PRE-SURGERY ENSURE that was provided to you by 9:30 the morning of surgery.  Please, if able, drink it in one setting. DO NOT SIP.  ?  ? Take these medicines the morning of surgery with A SIP OF WATER:  ? Paroxetine (Paxil) ? ?As of today, STOP taking any Aspirin (unless otherwise instructed by your surgeon) Aleve, Naproxen, Ibuprofen, Motrin, Advil, Goody's, BC's, all herbal medications, fish oil, and all vitamins. ? ?         DAY OF SURGERY: ?Do not wear jewelry or makeup ?Do not wear lotions, powders, perfumes, or deodorant. ?Do not shave 48 hours prior to surgery.   ?Do not bring valuables to the hospital. ?Do not wear nail polish, gel polish, artificial nails, or any other type of covering on natural nails (fingers and toes) ?If you have artificial nails or gel coating that need to be removed by a nail salon, please have this removed prior to surgery. Artificial nails or gel coating may interfere with anesthesia's ability to adequately monitor your vital signs. ? ?[facility] is not responsible for any belongings or valuables. .  ? ?Do NOT Smoke (Tobacco/Vaping)  24 hours prior to your procedure ? ?If you use a CPAP at night, you may bring your mask for your overnight stay. ?  ?Contacts, glasses, hearing aids,  dentures or partials may not be worn into surgery, please bring cases for these belongings ?  ?For patients admitted to the hospital, discharge time will be determined by your treatment team. ?  ?Patients discharged the day of surgery will not be allowed to drive home, and someone needs to stay with them for 24 hours. ? ? ?SURGICAL WAITING ROOM VISITATION ?Patients having surgery or a procedure in a hospital may have two support people. ?Children under the age of 57 must have an adult with them who is not the patient. ?They may stay in the waiting area during the procedure and may switch out with other visitors. If the patient needs to stay at the hospital during part of their recovery, the visitor guidelines for inpatient rooms apply. ? ?Please refer to the [facility] website for the visitor guidelines for Inpatients (after your surgery is over and you are in a regular room).  ? ? ?Special instructions:   ? ?Oral Hygiene is also important to reduce your risk of infection.  Remember - BRUSH YOUR TEETH THE MORNING OF SURGERY WITH YOUR REGULAR TOOTHPASTE ? ? ?[facility]- Preparing For Surgery ? ?Before surgery, you can play an important role. Because skin is not sterile, your skin needs to be as free of germs as possible. You can reduce the number of germs on your skin by washing with CHG (chlorahexidine gluconate) Soap before surgery.  CHG is an antiseptic cleaner which  kills germs and bonds with the skin to continue killing germs even after washing.   ? ? ?Please do not use if you have an allergy to CHG or antibacterial soaps. If your skin becomes reddened/irritated stop using the CHG.  ?Do not shave (including legs and underarms) for at least 48 hours prior to first CHG shower. It is OK to shave your face. ? ?Please follow these instructions carefully. ?  ? ? Shower the NIGHT BEFORE SURGERY and the MORNING OF SURGERY with CHG Soap.  ? If you chose to wash your hair, wash your hair first as usual with your normal  shampoo. After you shampoo, rinse your hair and body thoroughly to remove the shampoo.  Then [employer] and genitals (private parts) with your normal soap and rinse thoroughly to remove soap. ? ?After that Use CHG Soap as you would any other liquid soap. You can apply CHG directly to the skin and wash gently with a scrungie or a clean washcloth.  ? ?Apply the CHG Soap to your body ONLY FROM THE NECK DOWN.  Do not use on open wounds or open sores. Avoid contact with your eyes, ears, mouth and genitals (private parts). Wash Face and genitals (private parts)  with your normal soap.  ? ?Wash thoroughly, paying special attention to the area where your surgery will be performed. ? ?Thoroughly rinse your body with warm water from the neck down. ? ?DO NOT shower/wash with your normal soap after using and rinsing off the CHG Soap. ? ?Pat yourself dry with a CLEAN TOWEL. ? ?Wear CLEAN PAJAMAS to bed the night before surgery ? ?Place CLEAN SHEETS on your bed the night before your surgery ? ?DO NOT SLEEP WITH PETS. ? ? ?Day of Surgery: ? ?Take a shower with CHG soap. ?Wear Clean/Comfortable clothing the morning of surgery ?Do not apply any deodorants/lotions.   ?Remember to brush your teeth WITH YOUR REGULAR TOOTHPASTE. ? ?Please read over the following fact sheets that you were given.  ? ?

## 2021-09-12 NOTE — Anesthesia Preprocedure Evaluation (Addendum)
Anesthesia Evaluation  ?Patient identified by MRN, date of birth, ID band ?Patient awake ? ? ? ?Reviewed: ?Allergy & Precautions, NPO status , Patient's Chart, lab work & pertinent test results ? ?Airway ?Mallampati: I ? ?TM Distance: >3 FB ?Neck ROM: Full ? ? ? Dental ?no notable dental hx. ? ?  ?Pulmonary ?neg pulmonary ROS,  ?  ?Pulmonary exam normal ?breath sounds clear to auscultation ? ? ? ? ? ? Cardiovascular ?hypertension, Pt. on medications ?negative cardio ROS ?Normal cardiovascular exam ?Rhythm:Regular Rate:Normal ? ? ?  ?Neuro/Psych ?PSYCHIATRIC DISORDERS Anxiety negative neurological ROS ?   ? GI/Hepatic ?Neg liver ROS, GERD  ,  ?Endo/Other  ?negative endocrine ROS ? Renal/GU ?negative Renal ROS  ?negative genitourinary ?  ?Musculoskeletal ?negative musculoskeletal ROS ?(+)  ? Abdominal ?  ?Peds ?negative pediatric ROS ?(+)  Hematology ? ?(+) Blood dyscrasia, anemia ,   ?Anesthesia Other Findings ? ? Reproductive/Obstetrics ?negative OB ROS ? ?  ? ? ? ? ? ? ? ? ? ? ? ? ? ?  ?  ? ? ? ? ? ? ?Anesthesia Physical ?Anesthesia Plan ? ?ASA: 2 ? ?Anesthesia Plan: General  ? ?Post-op Pain Management: Tylenol PO (pre-op)*  ? ?Induction: Intravenous ? ?PONV Risk Score and Plan: 3 and Treatment may vary due to age or medical condition, Ondansetron, Scopolamine patch - Pre-op, Midazolam and Dexamethasone ? ?Airway Management Planned: LMA ? ?Additional Equipment: None ? ?Intra-op Plan:  ? ?Post-operative Plan: Extubation in OR ? ?Informed Consent: I have reviewed the patients History and Physical, chart, labs and discussed the procedure including the risks, benefits and alternatives for the proposed anesthesia with the patient or authorized representative who has indicated his/her understanding and acceptance.  ? ? ? ?Dental advisory given ? ?Plan Discussed with: CRNA, Anesthesiologist and Surgeon ? ?Anesthesia Plan Comments: (PAT note by Karoline Caldwell, PA-C: ?Preop labs notable for  elevated WBC at 18.1.  I reached out to patient's PCP Dr. Billey Gosling who advised patient had both IM and p.o. steroids for treatment of urticarial reaction following recent biopsy.  She felt this reasonably explained patient's elevated WBC.  I also spoke with patient and she confirmed she is having no signs or symptoms of illness.  No further evaluation necessary unless patient were to develop any signs/symptoms of illness. ? ?Remainder of preop labs unremarkable. ? ?EKG 09/08/2021: NSR.  Rate 92. ? ?)  ? ? ? ? ? ?Anesthesia Quick Evaluation ? ?

## 2021-09-12 NOTE — Progress Notes (Signed)
Anesthesia Chart Review: ? ?Preop labs notable for elevated WBC at 18.1.  I reached out to patient's PCP Dr. Billey Gosling who advised patient had both IM and p.o. steroids for treatment of urticarial reaction following recent biopsy.  She felt this reasonably explained patient's elevated WBC.  I also spoke with patient and she confirmed she is having no signs or symptoms of illness.  No further evaluation necessary unless patient were to develop any signs/symptoms of illness. ? ?Remainder of preop labs unremarkable. ? ?EKG 09/08/2021: NSR.  Rate 92. ? ? ?Marie Caldwell, PA-C ?[facility]/Anesthesiology ?Phone [phone] ?09/12/2021 8:53 AM ? ? ?

## 2021-09-12 NOTE — H&P (Signed)
PROVIDER:  Georgianne Fick, MD ?  ?Care Team: Patient Care Team: ?Binnie Rail, MD as PCP - General (Internal Medicine)  ?  ?MRN: [id number] ?DOB: 1971-01-23 ? ?  ?Subjective  ?  ?Chief Complaint: New Patient (New patient right breast lobular carcinoma in situ ) ?  ?  ?  ?History of Present Illness: ?Marie Adams is a 51 y.o. female who is seen today as an office consultation at the request of Dr. Quay Burow for evaluation of New Patient (New patient right breast lobular carcinoma in situ ) ?Marland Kitchen   ?Pt presented with screening detected right breast calcifications.  She came back for diagnostic imaging and was seen to have 4 mm of calcifications in the UOQ of the right breast.  She underwent core needle biopsy and this showed LCIS.  She is here to discuss.   ?  ?Her mother had colon polyps, and her maternal grandmother had ovarian cancer in her late 87s.   ?  ?She has a Oceanographer in clinical psychology and her wife is a PA in a Veterinary surgeon.  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   ?  ?  ?Diagnostic mammogram: 05/26/2021 [facility] ?ACR Breast Density Category c: The breast tissue is heterogeneously ?dense, which may obscure small masses. ?  ?FINDINGS: ?Spot compression magnification views were performed over the ?upper-outer right breast. There is a 0.4 cm group of faint amorphous ?calcifications. ?  ?IMPRESSION: ?Indeterminate 0.4 cm group of calcifications in the upper-outer ?right breast. ?  ?RECOMMENDATION: ?Recommend stereotactic guided biopsy of the calcifications in the ?upper-outer right breast. ?  ?I have discussed the findings and recommendations with the patient. ?If applicable, a reminder letter will be sent to the patient ?regarding the next appointment. ?  ?BI-RADS CATEGORY  4: Suspicious. ?  ?  ?Pathology core needle biopsy: 06/08/2021 ?LOBULAR NEOPLASIA (LOBULAR CARCINOMA IN-SITU) ?- COLUMNAR CELL AND FIBROCYSTIC CHANGES WITH CALCIFICATIONS ?- PSEUDOANGIOMATOUS STROMAL HYPERPLASIA ?- NO MALIGNANCY IDENTIFIED ?   ?  ?  ?Review of Systems: ?A complete review of systems was obtained from the patient.  I have reviewed this information and discussed as appropriate with the patient.  See HPI as well for other ROS. ?  ?Review of Systems  ?Psychiatric/Behavioral: The patient is nervous/anxious.   ?All other systems reviewed and are negative. ?  ?  ?  ?Medical History: ?Past Medical History  ?    ?Past Medical History:  ?Diagnosis Date  ? Hypertension    ?  ?  ?  ?   ?Patient Active Problem List  ?Diagnosis  ? Lobular carcinoma in situ (LCIS) of right breast  ? Family history of ovarian cancer  ?  ?  ?Past Surgical History  ?     ?Past Surgical History:  ?Procedure Laterality Date  ? removal fibroid from breast       ?  ?  ?  ?Allergies  ?    ?Allergies  ?Allergen Reactions  ? Promethazine Itching  ?  ?  ?  ?      ?Current Outpatient Medications on File Prior to Visit  ?Medication Sig Dispense Refill  ? calcium polycarbophiL 500 mg Chew Take by mouth      ? lisinopriL (ZESTRIL) 5 MG tablet lisinopril 5 mg tablet ? TAKE 1 TABLET (5 MG TOTAL) BY MOUTH DAILY.      ? multivitamin tablet Take 1 tablet by mouth once daily      ? PARoxetine (PAXIL) 20 MG tablet paroxetine  20 mg tablet ? TAKE 1/2 TABLET BY MOUTH EVERY DAY      ?  ?No current facility-administered medications on file prior to visit.  ?  ?  ?Family History  ?History reviewed. No pertinent family history.  ?  ?  ?Social History  ?  ?   ?Tobacco Use  ?Smoking Status Never  ?Smokeless Tobacco Never  ?  ?  ?Social History  ?Social History  ?  ?     ?Socioeconomic History  ? Marital status: Married  ?Tobacco Use  ? Smoking status: Never  ? Smokeless tobacco: Never  ?Substance and Sexual Activity  ? Alcohol use: Yes  ?    Comment: 5 drinks a week  ? Drug use: Never  ?  ?  ?  ?Objective:  ?  ?  ?   ?Vitals:  ?    ?BP: 124/70  ?Pulse: 80  ?Temp: 36.7 ?C (98 ?F)  ?SpO2: 98%  ?Weight: 73.8 kg (162 lb 9.6 oz)  ?Height: 165.1 cm ('5\' 5"'$ )  ?  ?Body mass index is 27.06 kg/m?. ?  ?  ?   ?Gen:  No acute distress.  Well nourished and well groomed.   ?Neurological: Alert and oriented to person, place, and time. Coordination normal.  ?Head: Normocephalic and atraumatic.  ?Eyes: Conjunctivae are normal. Pupils are equal, round, and reactive to light. No scleral icterus.  ?Neck: Normal range of motion. Neck supple. No tracheal deviation or thyromegaly present.  ?Cardiovascular: Normal rate, regular rhythm, normal heart sounds and intact distal pulses.  Exam reveals no gallop and no friction rub.  No murmur heard. ?Breast: relatively symmetric moderate ptosis.  No palpable masses.  Minimal bruising UOQ right breast.  No LAD.  No nipple retraction or nipple discharge.   ?Respiratory: Effort normal.  No respiratory distress. No chest wall tenderness. Breath sounds normal.  No wheezes, rales or rhonchi.  ?GI: Soft. Bowel sounds are normal. The abdomen is soft and nontender.  There is no rebound and no guarding.  ?Musculoskeletal: Normal range of motion. Extremities are nontender.  ?Lymphadenopathy: No cervical, preauricular, postauricular or axillary adenopathy is present ?Skin: Skin is warm and dry. No rash noted. No diaphoresis. No erythema. No pallor. No clubbing, cyanosis, or edema.   ?Psychiatric: Normal mood and affect. Behavior is normal. Judgment and thought content normal.  ?  ?  ?Labs ?n/a ?  ?Assessment and Plan:  ?  ?Lobular carcinoma in situ (LCIS) of right breast ?Discussed that this is a risk factor for developing cancer. ?   ?The surgical procedure was described to the patient.  I discussed the incision type and location and that we would need radiology involved on with a wire or seed marker and/or sentinel node.     ?  ?The risks and benefits of the procedure were described to the patient and she wishes to proceed.   ?  ?We discussed the risks bleeding, infection, damage to other structures, need for further procedures/surgeries.  We discussed the risk of seroma.  The patient was advised if  the area in the breast in cancer, we may need to go back to surgery for additional tissue to obtain negative margins or for a lymph node biopsy. The patient was advised that these are the most common complications, but that others can occur as well.  They were advised against taking aspirin or other anti-inflammatory agents/blood thinners the week before surgery.   ?  ?  ?Family history of ovarian cancer ?Pt will  also consider genetic testing.  ?  ?Once path is back on excision, will refer to oncology to consider tamoxifen tx. ?  ?  ?  ?  ?No follow-ups on file. ?  ?  ?Milus Height, MD FACS ?Surgical Oncology, General Surgery, Trauma and Critical Care ?[facility] ?A [facility] ?  ? ?

## 2021-09-14 ENCOUNTER — Ambulatory Visit
Admission: RE | Admit: 2021-09-14 | Discharge: 2021-09-14 | Disposition: A | Discharge status: Home / Self Care | Payer: BC Managed Care – PPO | Source: Ambulatory Visit | Attending: General Surgery | Admitting: General Surgery

## 2021-09-14 DIAGNOSIS — R928 Other abnormal and inconclusive findings on diagnostic imaging of breast: Secondary | ICD-10-CM | POA: Diagnosis not present

## 2021-09-14 DIAGNOSIS — D0511 Intraductal carcinoma in situ of right breast: Secondary | ICD-10-CM | POA: Diagnosis not present

## 2021-09-14 DIAGNOSIS — N6092 Unspecified benign mammary dysplasia of left breast: Secondary | ICD-10-CM

## 2021-09-14 DIAGNOSIS — D0501 Lobular carcinoma in situ of right breast: Secondary | ICD-10-CM

## 2021-09-14 IMAGING — MG MM PLC BREAST LOC DEV 1ST LESION INC MAMMO GUIDE*L*
8 series · 8 of 8 positions shown · non-contrast
Comparison: With priors.

CLINICAL DATA: Biopsy proven atypical lobular hyperplasia in the
left breast. Seed localization prior to surgery.

EXAM:
MAMMOGRAPHIC GUIDED RADIOACTIVE SEED LOCALIZATION OF THE LEFT BREAST

[L LM (1 of 2)]
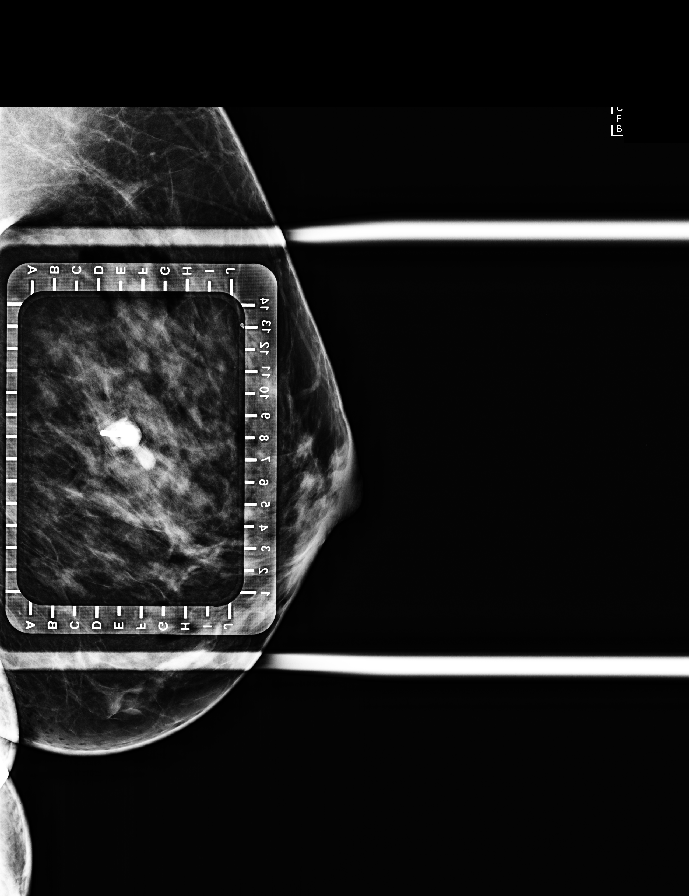

[L CC (1 of 5)]
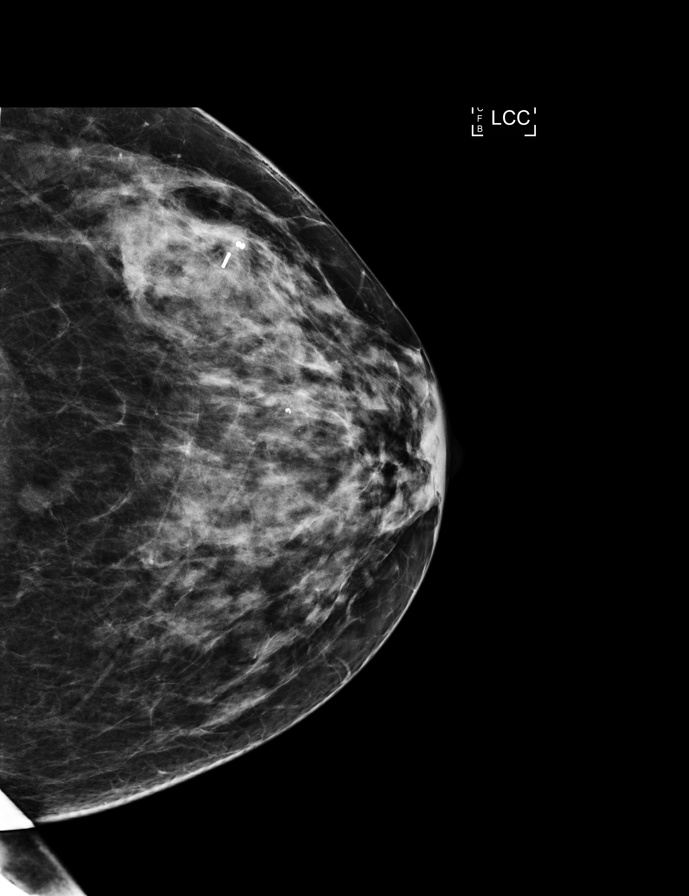

[L LM (2 of 2)]
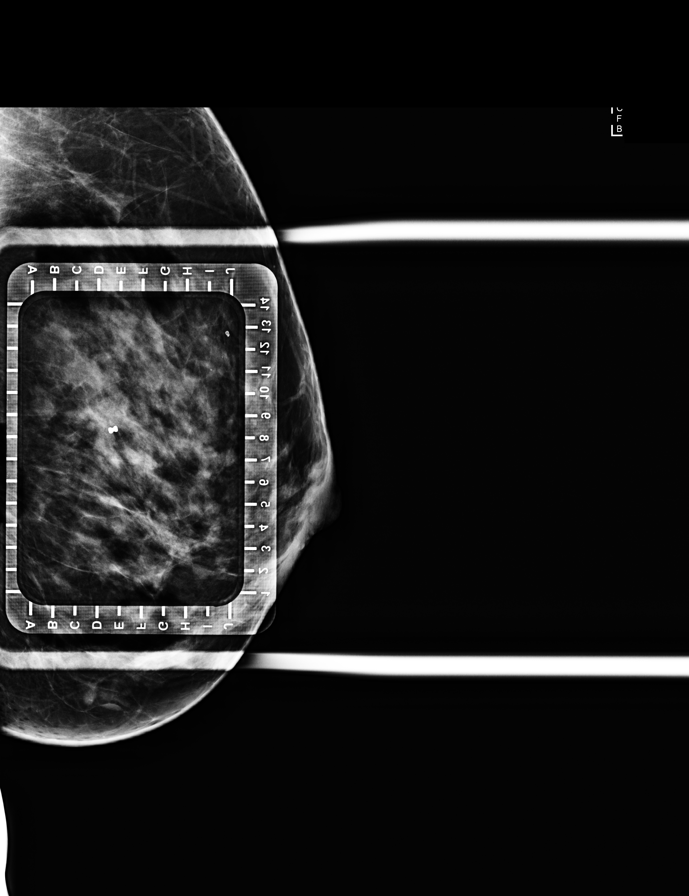

[L ML]
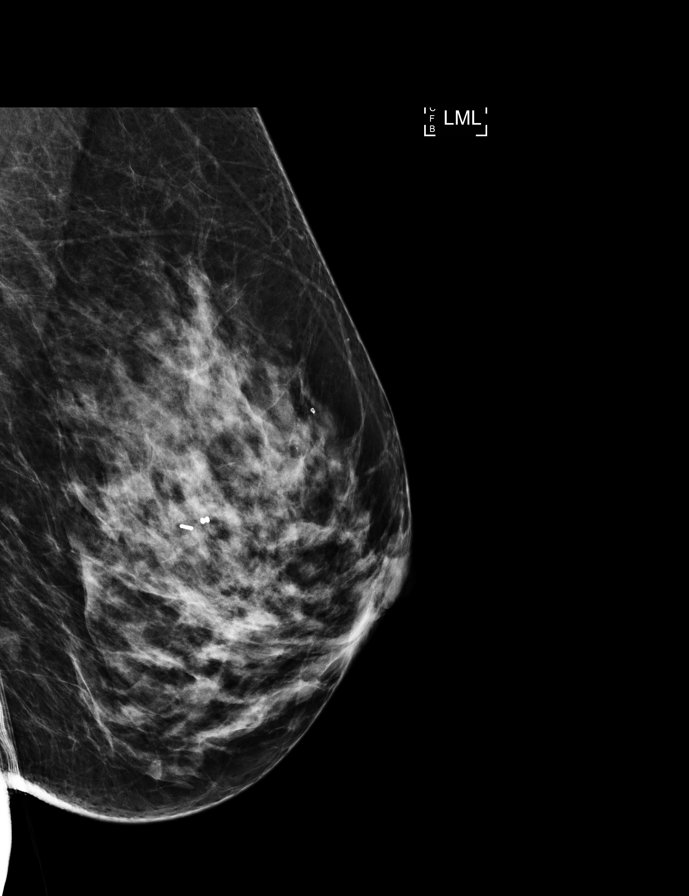

[L CC (2 of 5)]
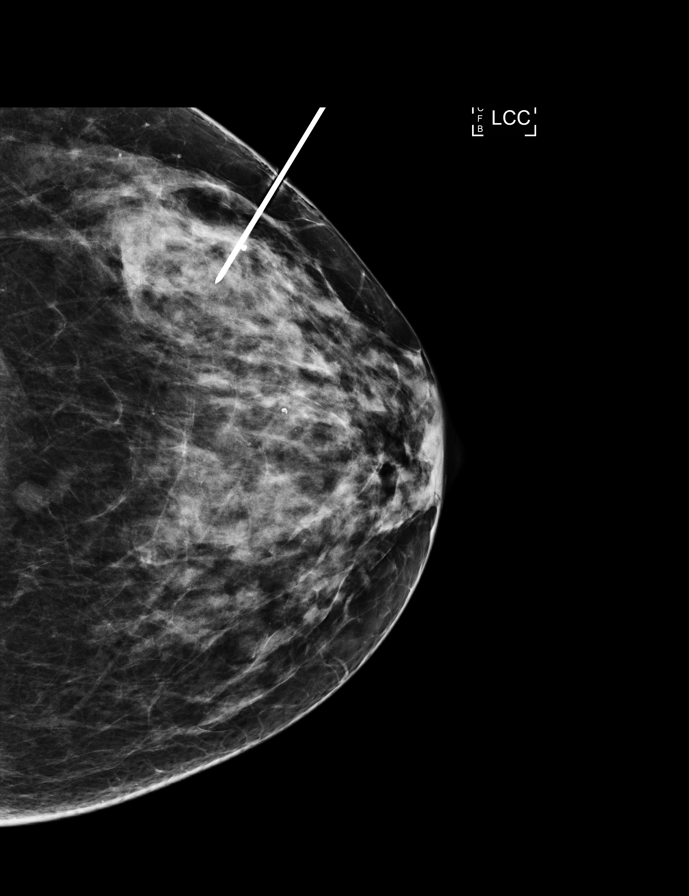

[L CC (3 of 5)]
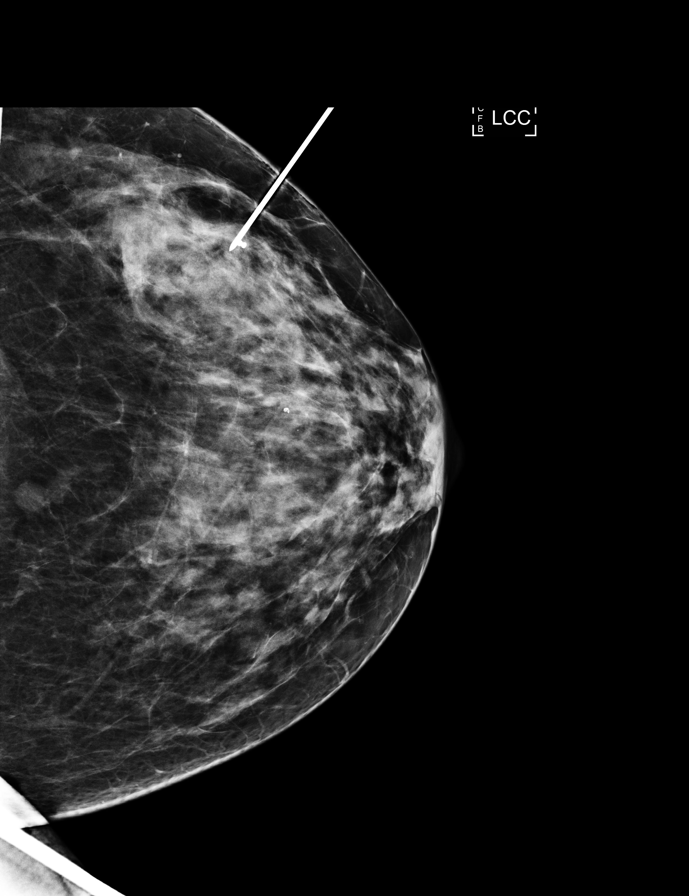

[L CC (4 of 5)]
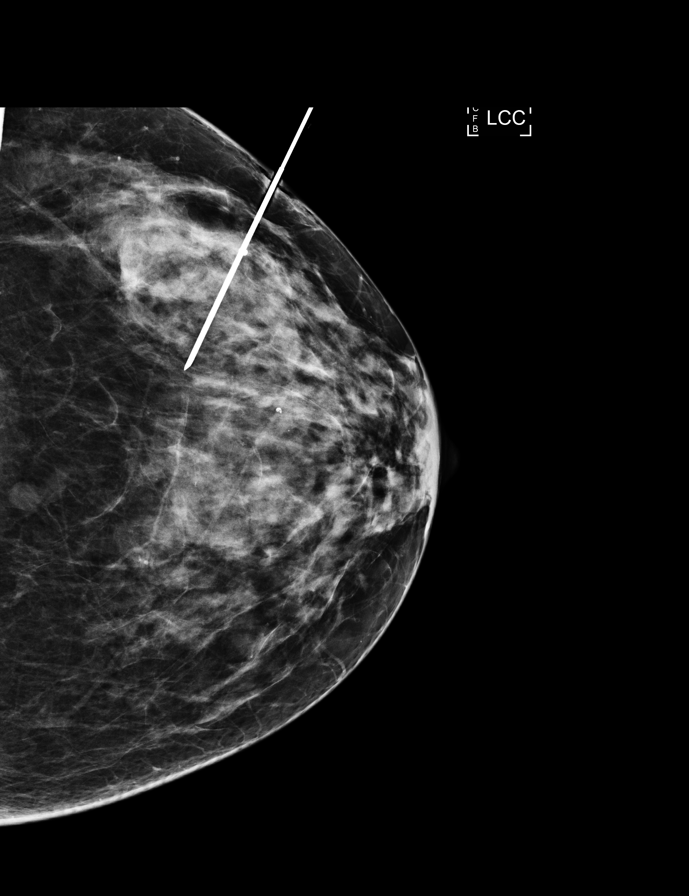

[L CC (5 of 5)]
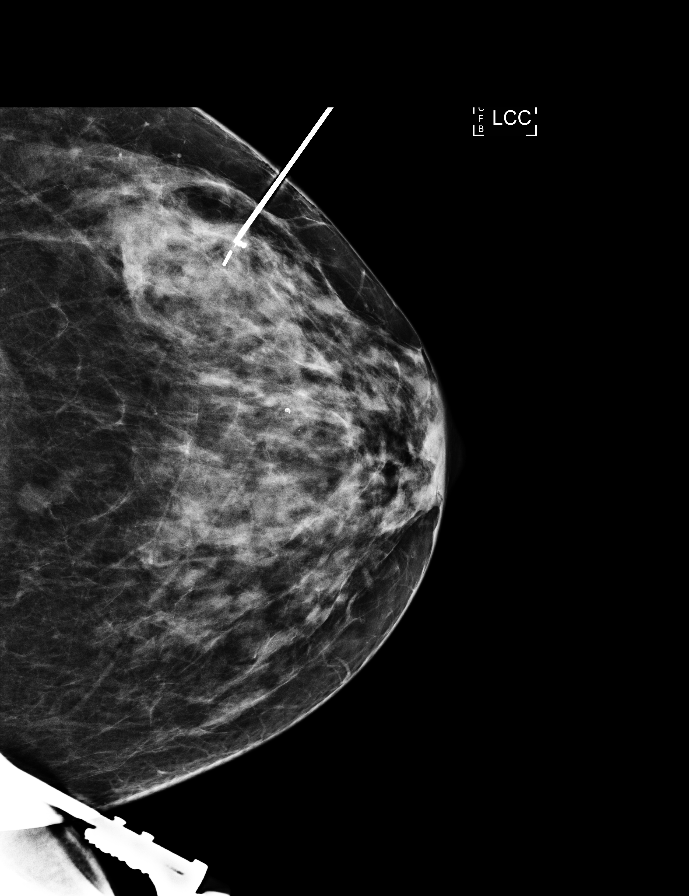

[8 of 8 positions shown; findings below may reference images not displayed]



The usual time-out protocol was performed immediately prior to the
procedure.

Using mammographic guidance, sterile technique, 1% lidocaine and an
[D1] radioactive seed, barbell shaped was localized using a lateral
to medial approach. The follow-up mammogram images confirm the seed
in the expected location and were marked for Dr. YEIS.

Follow-up survey of the patient confirms presence of the radioactive
seed.

Order number of [D1] seed:  [PHONE_NUMBER].

Total activity:  0.260 millicuries reference Date: [DATE]

The patient tolerated the procedure well and was released from the
[REDACTED]. She was given instructions regarding seed removal.
IMPRESSION: Radioactive seed localization left breast. No apparent
complications.

## 2021-09-14 IMAGING — MG MM PLC BREAST LOC DEV 1ST LESION INC*R*
8 series · 8 of 8 positions shown · non-contrast
Comparison: None Available.

CLINICAL DATA: Biopsy proven lobular carcinoma in-situ in the right
breast. Radioactive seed localization prior to surgery.

EXAM:
MAMMOGRAPHIC GUIDED RADIOACTIVE SEED LOCALIZATION OF THE RIGHT
BREAST

[R CC (1 of 3)]
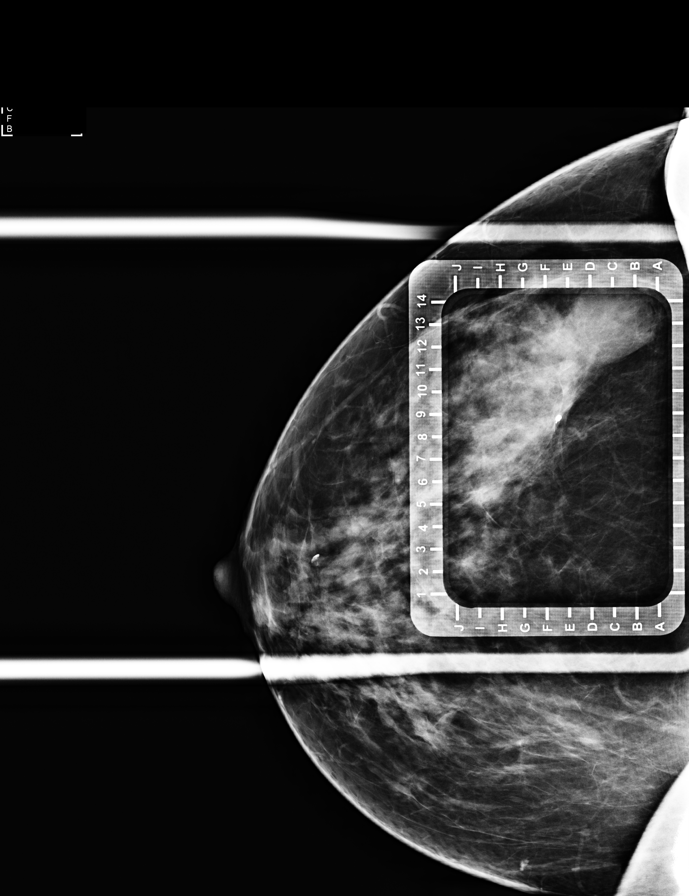

[R ML (1 of 5)]
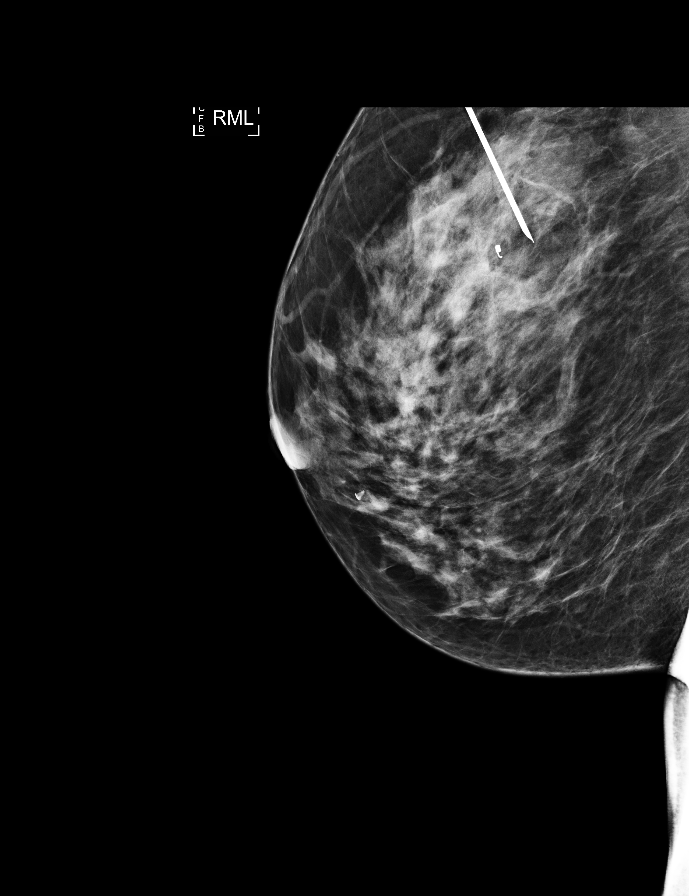

[R ML (2 of 5)]
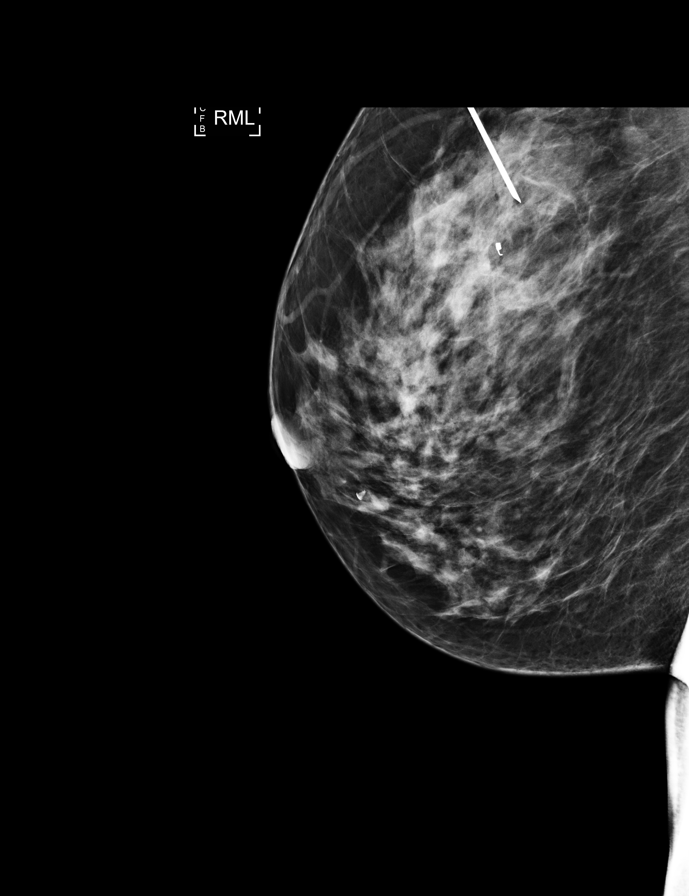

[R ML (3 of 5)]
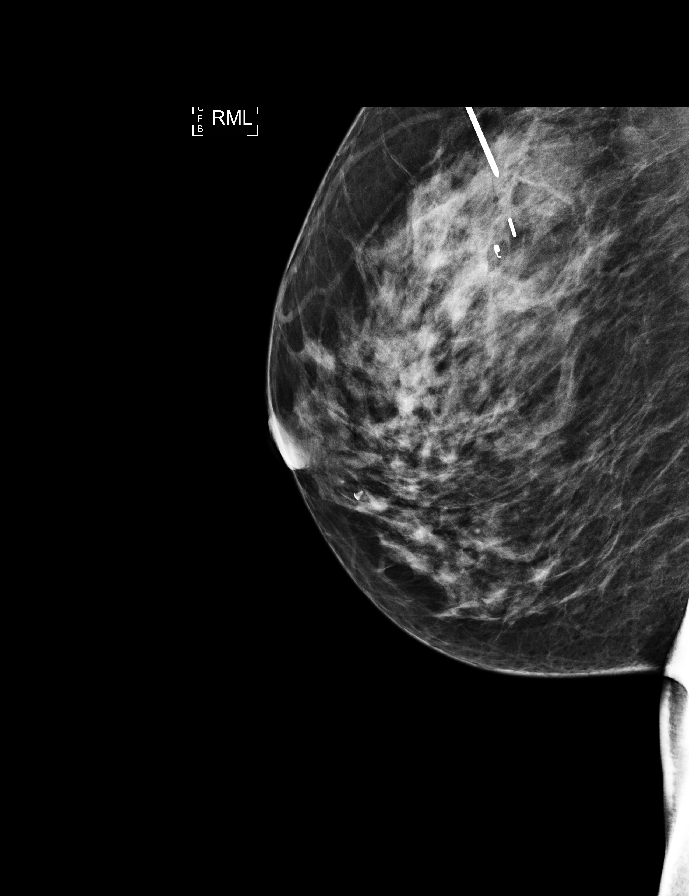

[R ML (4 of 5)]
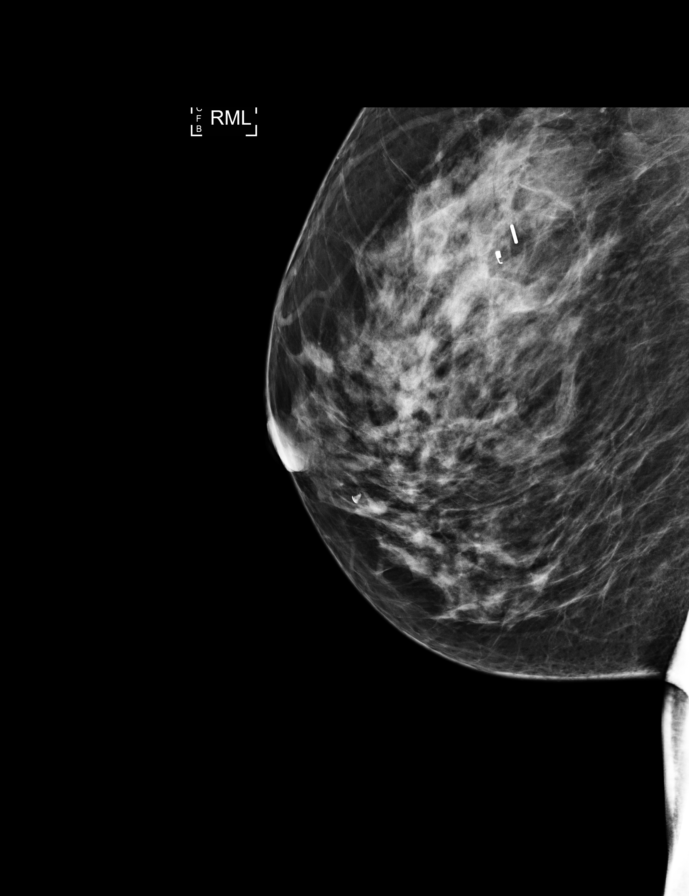

[R ML (5 of 5)]
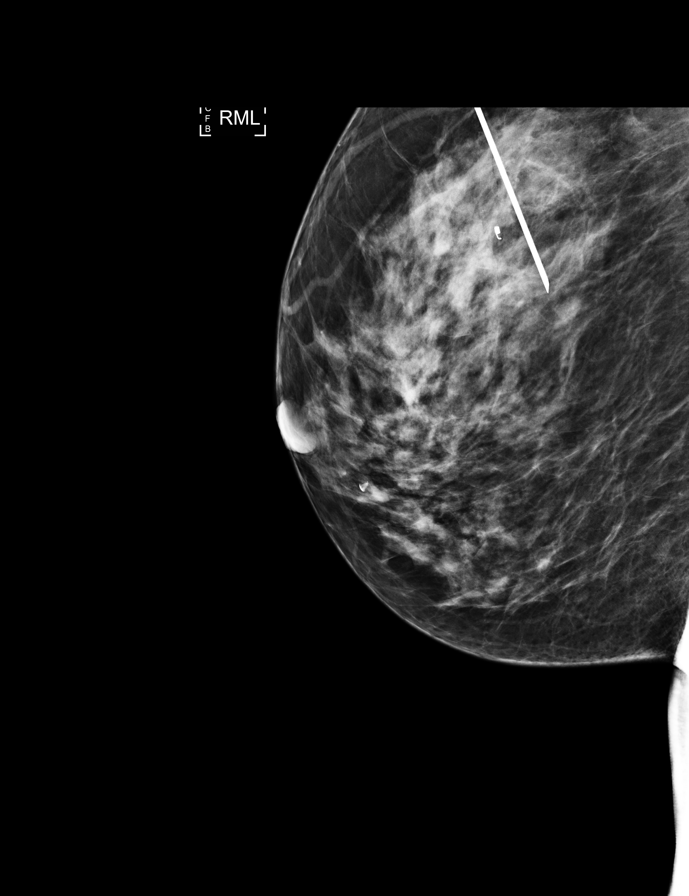

[R CC (2 of 3)]
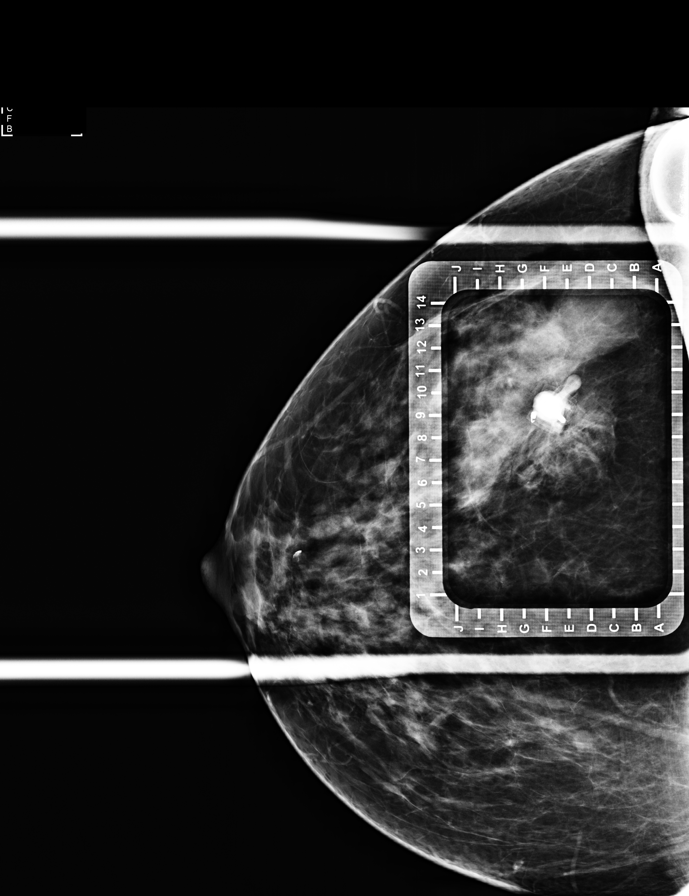

[R CC (3 of 3)]
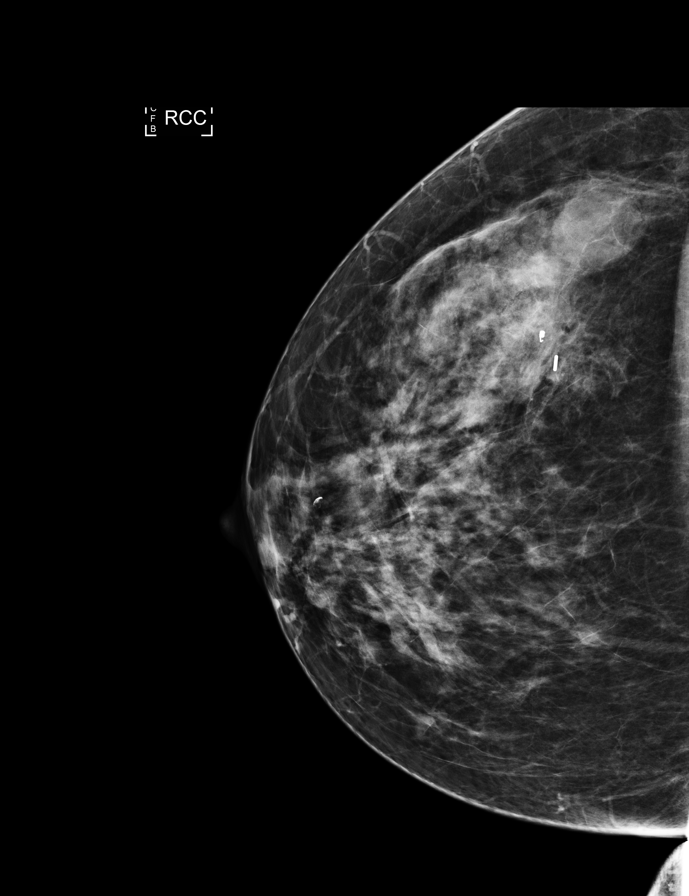

[8 of 8 positions shown; findings below may reference images not displayed]



The usual time-out protocol was performed immediately prior to the
procedure.

Using mammographic guidance, sterile technique, 1% lidocaine and an
[RT] radioactive seed, the coil shaped clip was localized using a
superior to inferior approach. The follow-up mammogram images
confirm the seed in the expected location and were marked for Dr.
JANYK.

Follow-up survey of the patient confirms presence of the radioactive
seed.

Order number of [RT] seed:  [PHONE_NUMBER].

Total activity:  0.255 millicuries reference Date: [DATE]

The patient tolerated the procedure well and was released from the
[REDACTED]. She was given instructions regarding seed removal.
IMPRESSION: Radioactive seed localization right breast. No apparent
complications.

## 2021-09-15 ENCOUNTER — Encounter (HOSPITAL_COMMUNITY)
Admission: RE | Disposition: A | Discharge status: Home / Self Care | Payer: Self-pay | Source: Home / Self Care | Attending: General Surgery

## 2021-09-15 ENCOUNTER — Ambulatory Visit (HOSPITAL_COMMUNITY): Payer: BC Managed Care – PPO | Admitting: Physician Assistant

## 2021-09-15 ENCOUNTER — Ambulatory Visit
Admission: RE | Admit: 2021-09-15 | Discharge: 2021-09-15 | Disposition: A | Discharge status: Home / Self Care | Payer: BC Managed Care – PPO | Source: Ambulatory Visit | Attending: General Surgery | Admitting: General Surgery

## 2021-09-15 ENCOUNTER — Ambulatory Visit (HOSPITAL_COMMUNITY)
Admission: RE | Admit: 2021-09-15 | Discharge: 2021-09-15 | Disposition: A | Discharge status: Home / Self Care | Payer: BC Managed Care – PPO | Attending: General Surgery | Admitting: General Surgery

## 2021-09-15 ENCOUNTER — Other Ambulatory Visit: Payer: Self-pay

## 2021-09-15 ENCOUNTER — Ambulatory Visit (HOSPITAL_COMMUNITY): Payer: BC Managed Care – PPO | Admitting: Certified Registered Nurse Anesthetist

## 2021-09-15 ENCOUNTER — Encounter (HOSPITAL_COMMUNITY): Payer: Self-pay | Admitting: General Surgery

## 2021-09-15 DIAGNOSIS — N6012 Diffuse cystic mastopathy of left breast: Secondary | ICD-10-CM | POA: Diagnosis not present

## 2021-09-15 DIAGNOSIS — N6092 Unspecified benign mammary dysplasia of left breast: Secondary | ICD-10-CM | POA: Diagnosis not present

## 2021-09-15 DIAGNOSIS — I10 Essential (primary) hypertension: Secondary | ICD-10-CM | POA: Insufficient documentation

## 2021-09-15 DIAGNOSIS — R928 Other abnormal and inconclusive findings on diagnostic imaging of breast: Secondary | ICD-10-CM | POA: Diagnosis not present

## 2021-09-15 DIAGNOSIS — D0501 Lobular carcinoma in situ of right breast: Secondary | ICD-10-CM | POA: Diagnosis not present

## 2021-09-15 DIAGNOSIS — R921 Mammographic calcification found on diagnostic imaging of breast: Secondary | ICD-10-CM | POA: Insufficient documentation

## 2021-09-15 DIAGNOSIS — F419 Anxiety disorder, unspecified: Secondary | ICD-10-CM | POA: Diagnosis not present

## 2021-09-15 DIAGNOSIS — D0511 Intraductal carcinoma in situ of right breast: Secondary | ICD-10-CM | POA: Diagnosis not present

## 2021-09-15 DIAGNOSIS — Z8371 Family history of colonic polyps: Secondary | ICD-10-CM | POA: Diagnosis not present

## 2021-09-15 DIAGNOSIS — N6082 Other benign mammary dysplasias of left breast: Secondary | ICD-10-CM | POA: Diagnosis not present

## 2021-09-15 DIAGNOSIS — Z8041 Family history of malignant neoplasm of ovary: Secondary | ICD-10-CM | POA: Insufficient documentation

## 2021-09-15 DIAGNOSIS — N6011 Diffuse cystic mastopathy of right breast: Secondary | ICD-10-CM | POA: Insufficient documentation

## 2021-09-15 HISTORY — PX: RADIOACTIVE SEED GUIDED EXCISIONAL BREAST BIOPSY: SHX6490

## 2021-09-15 HISTORY — PX: BREAST LUMPECTOMY WITH RADIOACTIVE SEED LOCALIZATION: SHX6424

## 2021-09-15 LAB — POCT PREGNANCY, URINE: Preg Test, Ur: NEGATIVE

## 2021-09-15 IMAGING — MG MM BREAST SURGICAL SPECIMEN
1 series · 1 of 1 positions shown · non-contrast
Comparison: Prior studies

CLINICAL DATA: Status post seed localized RIGHT lumpectomy.

EXAM:
SPECIMEN RADIOGRAPH OF THE RIGHT BREAST

[L]
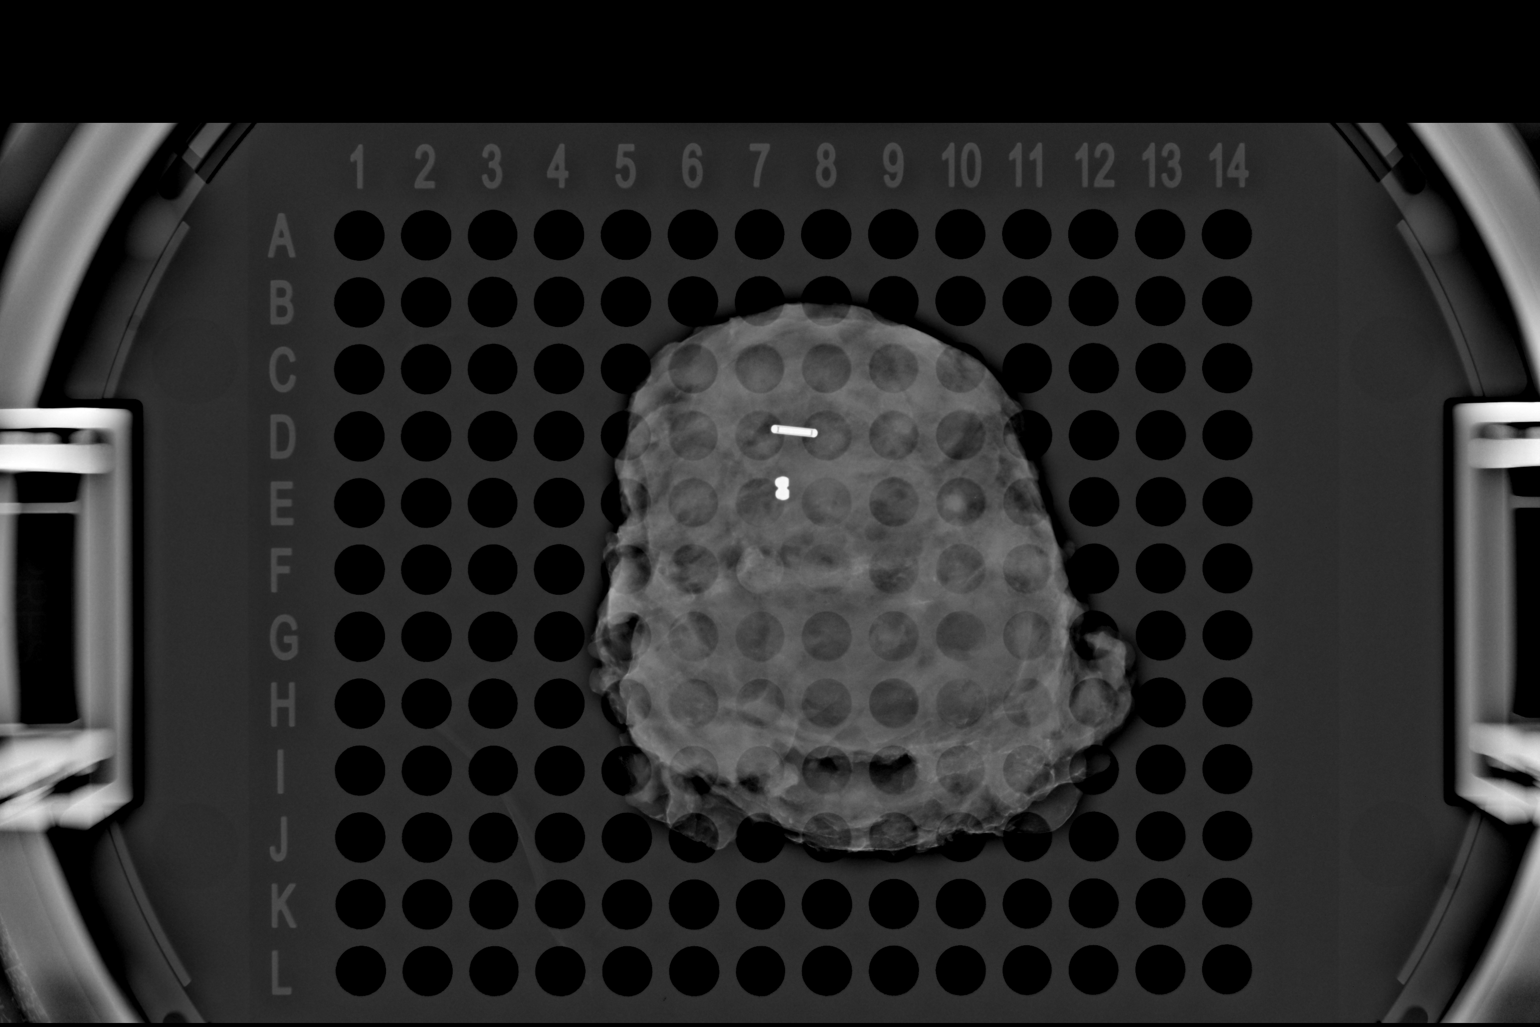

[1 of 1 positions shown; findings below may reference images not displayed]

FINDINGS: Status post excision of the right breast. The radioactive seed and
coil shaped biopsy marker clip are present, completely intact, and
were marked for pathology.
IMPRESSION: Specimen radiograph of the RIGHT breast.

## 2021-09-15 IMAGING — MG MM BREAST SURGICAL SPECIMEN
1 series · 1 of 1 positions shown · non-contrast
Comparison: Prior exams

CLINICAL DATA: Status post seed localized lumpectomy of the LEFT
breast.

EXAM:
SPECIMEN RADIOGRAPH OF THE LEFT BREAST

[R]
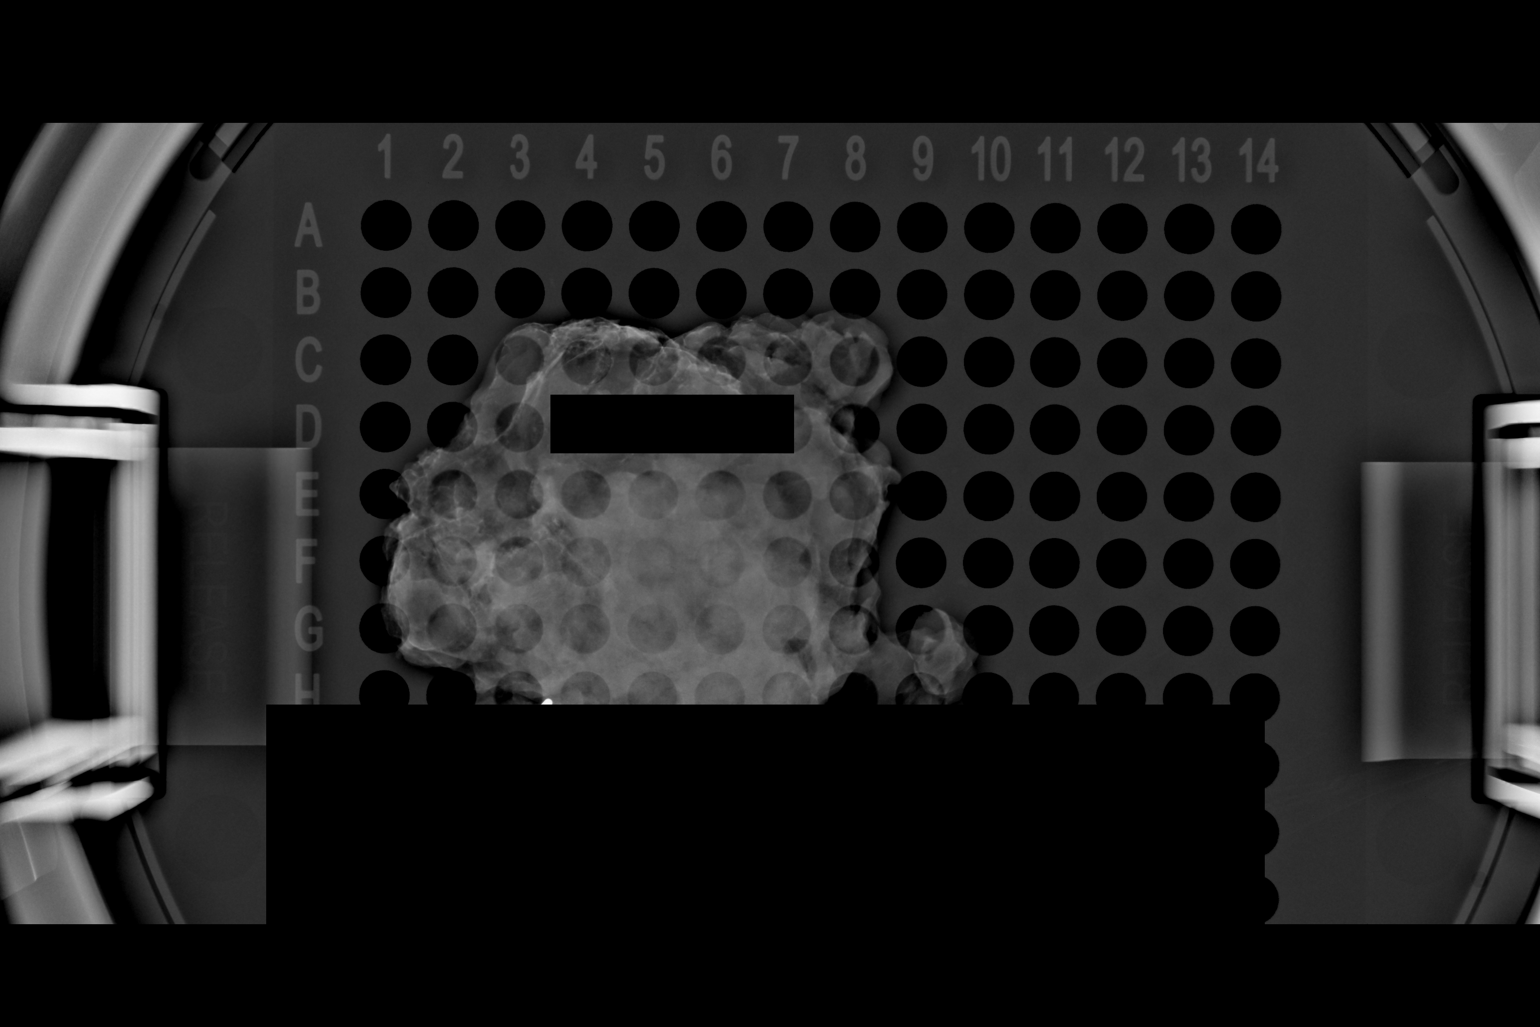

[1 of 1 positions shown; findings below may reference images not displayed]

FINDINGS: Status post excision of the left breast. The radioactive seed and
barbell biopsy marker clip are present, completely intact, and were
marked for pathology.
IMPRESSION: Specimen radiograph of the LEFT breast.

## 2021-09-15 SURGERY — BREAST LUMPECTOMY WITH RADIOACTIVE SEED LOCALIZATION
Anesthesia: General | Site: Breast | Laterality: Right

## 2021-09-15 MED ORDER — FENTANYL CITRATE (PF) 100 MCG/2ML IJ SOLN
INTRAMUSCULAR | Status: DC | PRN
  Administered 2021-09-15: 50 ug via INTRAVENOUS
  Administered 2021-09-15 (×2): 25 ug via INTRAVENOUS
  Administered 2021-09-15: 50 ug via INTRAVENOUS
  Administered 2021-09-15: 25 ug via INTRAVENOUS
  Administered 2021-09-15: 50 ug via INTRAVENOUS
  Administered 2021-09-15: 25 ug via INTRAVENOUS

## 2021-09-15 MED ORDER — AMISULPRIDE (ANTIEMETIC) 5 MG/2ML IV SOLN: 10.0000 mg | Freq: Once | INTRAVENOUS | Status: DC | PRN

## 2021-09-15 MED ORDER — PROPOFOL 10 MG/ML IV BOLUS
INTRAVENOUS | Status: DC | PRN
  Administered 2021-09-15: 50 mg via INTRAVENOUS
  Administered 2021-09-15: 150 mg via INTRAVENOUS

## 2021-09-15 MED ORDER — OXYCODONE HCL 5 MG/5ML PO SOLN: 5.0000 mg | Freq: Once | ORAL | Status: DC | PRN

## 2021-09-15 MED ORDER — EPHEDRINE 5 MG/ML INJ
INTRAVENOUS | Status: AC
  Filled 2021-09-15: qty 5

## 2021-09-15 MED ORDER — LIDOCAINE 2% (20 MG/ML) 5 ML SYRINGE
INTRAMUSCULAR | Status: DC | PRN
Start: 2021-09-15 — End: 2021-09-15
  Administered 2021-09-15: 80 mg via INTRAVENOUS

## 2021-09-15 MED ORDER — LIDOCAINE 2% (20 MG/ML) 5 ML SYRINGE
INTRAMUSCULAR | Status: AC
  Filled 2021-09-15: qty 5

## 2021-09-15 MED ORDER — LIDOCAINE HCL 1 % IJ SOLN
INTRAMUSCULAR | Status: DC | PRN
  Administered 2021-09-15 (×2): 30 mL via INTRAMUSCULAR

## 2021-09-15 MED ORDER — ONDANSETRON HCL 4 MG/2ML IJ SOLN
INTRAMUSCULAR | Status: AC
  Filled 2021-09-15: qty 2

## 2021-09-15 MED ORDER — LIDOCAINE HCL (PF) 1 % IJ SOLN
INTRAMUSCULAR | Status: AC
  Filled 2021-09-15: qty 30

## 2021-09-15 MED ORDER — DEXAMETHASONE SODIUM PHOSPHATE 10 MG/ML IJ SOLN
INTRAMUSCULAR | Status: DC | PRN
Start: 2021-09-15 — End: 2021-09-15
  Administered 2021-09-15: 10 mg via INTRAVENOUS

## 2021-09-15 MED ORDER — CHLORHEXIDINE GLUCONATE CLOTH 2 % EX PADS: 6.0000 | MEDICATED_PAD | Freq: Once | CUTANEOUS | Status: DC

## 2021-09-15 MED ORDER — MIDAZOLAM HCL 2 MG/2ML IJ SOLN
INTRAMUSCULAR | Status: AC
  Filled 2021-09-15: qty 2

## 2021-09-15 MED ORDER — PHENYLEPHRINE 80 MCG/ML (10ML) SYRINGE FOR IV PUSH (FOR BLOOD PRESSURE SUPPORT)
PREFILLED_SYRINGE | INTRAVENOUS | Status: DC | PRN
  Administered 2021-09-15 (×2): 80 ug via INTRAVENOUS
  Administered 2021-09-15: 160 ug via INTRAVENOUS
  Administered 2021-09-15: 80 ug via INTRAVENOUS
  Administered 2021-09-15 (×2): 160 ug via INTRAVENOUS
  Administered 2021-09-15 (×2): 80 ug via INTRAVENOUS

## 2021-09-15 MED ORDER — ONDANSETRON HCL 4 MG/2ML IJ SOLN
INTRAMUSCULAR | Status: DC | PRN
Start: 2021-09-15 — End: 2021-09-15
  Administered 2021-09-15: 4 mg via INTRAVENOUS

## 2021-09-15 MED ORDER — BUPIVACAINE-EPINEPHRINE (PF) 0.25% -1:200000 IJ SOLN
INTRAMUSCULAR | Status: AC
  Filled 2021-09-15: qty 30

## 2021-09-15 MED ORDER — SCOPOLAMINE 1 MG/3DAYS TD PT72
MEDICATED_PATCH | TRANSDERMAL | Status: AC
  Administered 2021-09-15: 1.5 mg via TRANSDERMAL
  Filled 2021-09-15: qty 1

## 2021-09-15 MED ORDER — EPHEDRINE SULFATE-NACL 50-0.9 MG/10ML-% IV SOSY
PREFILLED_SYRINGE | INTRAVENOUS | Status: DC | PRN
  Administered 2021-09-15: 5 mg via INTRAVENOUS

## 2021-09-15 MED ORDER — MIDAZOLAM HCL 5 MG/5ML IJ SOLN
INTRAMUSCULAR | Status: DC | PRN
Start: 2021-09-15 — End: 2021-09-15
  Administered 2021-09-15: 2 mg via INTRAVENOUS

## 2021-09-15 MED ORDER — LACTATED RINGERS IV SOLN: INTRAVENOUS | Status: DC | PRN

## 2021-09-15 MED ORDER — ACETAMINOPHEN 500 MG PO TABS
ORAL_TABLET | ORAL | Status: AC
  Filled 2021-09-15: qty 2

## 2021-09-15 MED ORDER — FENTANYL CITRATE (PF) 100 MCG/2ML IJ SOLN: 25.0000 ug | INTRAMUSCULAR | Status: DC | PRN

## 2021-09-15 MED ORDER — CHLORHEXIDINE GLUCONATE 0.12 % MT SOLN
OROMUCOSAL | Status: AC
Start: 2021-09-15 — End: 2021-09-15
  Administered 2021-09-15: 15 mL
  Filled 2021-09-15: qty 15

## 2021-09-15 MED ORDER — KETOROLAC TROMETHAMINE 30 MG/ML IJ SOLN
INTRAMUSCULAR | Status: DC | PRN
  Administered 2021-09-15: 30 mg via INTRAVENOUS

## 2021-09-15 MED ORDER — OXYCODONE HCL 5 MG PO TABS: 5.0000 mg | ORAL_TABLET | Freq: Once | ORAL | Status: DC | PRN

## 2021-09-15 MED ORDER — ACETAMINOPHEN 500 MG PO TABS: 1000.0000 mg | ORAL_TABLET | ORAL | Status: DC

## 2021-09-15 MED ORDER — DEXAMETHASONE SODIUM PHOSPHATE 10 MG/ML IJ SOLN
INTRAMUSCULAR | Status: AC
  Filled 2021-09-15: qty 1

## 2021-09-15 MED ORDER — OXYCODONE HCL 5 MG PO TABS: 5.0000 mg | ORAL_TABLET | Freq: Four times a day (QID) | ORAL | 0 refills | Status: DC | PRN

## 2021-09-15 MED ORDER — 0.9 % SODIUM CHLORIDE (POUR BTL) OPTIME
TOPICAL | Status: DC | PRN
Start: 2021-09-15 — End: 2021-09-15
  Administered 2021-09-15: 1000 mL

## 2021-09-15 MED ORDER — CEFAZOLIN SODIUM-DEXTROSE 2-4 GM/100ML-% IV SOLN
2.0000 g | INTRAVENOUS | Status: AC
  Administered 2021-09-15: 2 g via INTRAVENOUS
  Filled 2021-09-15: qty 100

## 2021-09-15 MED ORDER — PROPOFOL 10 MG/ML IV BOLUS
INTRAVENOUS | Status: AC
  Filled 2021-09-15: qty 20

## 2021-09-15 MED ORDER — FENTANYL CITRATE (PF) 250 MCG/5ML IJ SOLN
INTRAMUSCULAR | Status: AC
  Filled 2021-09-15: qty 5

## 2021-09-15 MED ORDER — PHENYLEPHRINE 80 MCG/ML (10ML) SYRINGE FOR IV PUSH (FOR BLOOD PRESSURE SUPPORT)
PREFILLED_SYRINGE | INTRAVENOUS | Status: AC
  Filled 2021-09-15: qty 10

## 2021-09-15 MED ORDER — SCOPOLAMINE 1 MG/3DAYS TD PT72: 1.0000 | MEDICATED_PATCH | TRANSDERMAL | Status: DC

## 2021-09-15 MED ORDER — ONDANSETRON HCL 4 MG/2ML IJ SOLN: 4.0000 mg | Freq: Once | INTRAMUSCULAR | Status: DC | PRN

## 2021-09-15 SURGICAL SUPPLY — 53 items
ADH SKN CLS APL DERMABOND .7 (GAUZE/BANDAGES/DRESSINGS) ×2
APL PRP STRL LF DISP 70% ISPRP (MISCELLANEOUS) ×2
BAG COUNTER SPONGE SURGICOUNT (BAG) ×3 IMPLANT
BAG SPNG CNTER NS LX DISP (BAG) ×2
BINDER BREAST LRG (GAUZE/BANDAGES/DRESSINGS) ×1 IMPLANT
BINDER BREAST XLRG (GAUZE/BANDAGES/DRESSINGS) IMPLANT
BNDG COHESIVE 4X5 TAN STRL (GAUZE/BANDAGES/DRESSINGS) ×3 IMPLANT
CANISTER SUCT 3000ML PPV (MISCELLANEOUS) ×3 IMPLANT
CHLORAPREP W/TINT 26 (MISCELLANEOUS) ×3 IMPLANT
CLIP TI LARGE 6 (CLIP) ×3 IMPLANT
CLIP TI MEDIUM 24 (CLIP) ×3 IMPLANT
CLIP VESOCCLUDE LG 6/CT (CLIP) ×3 IMPLANT
CLSR STERI-STRIP ANTIMIC 1/2X4 (GAUZE/BANDAGES/DRESSINGS) ×1 IMPLANT
CNTNR URN SCR LID CUP LEK RST (MISCELLANEOUS) IMPLANT
CONT SPEC 4OZ STRL OR WHT (MISCELLANEOUS)
COVER PROBE W GEL 5X96 (DRAPES) ×3 IMPLANT
COVER SURGICAL LIGHT HANDLE (MISCELLANEOUS) ×3 IMPLANT
DERMABOND ADVANCED (GAUZE/BANDAGES/DRESSINGS) ×1
DERMABOND ADVANCED .7 DNX12 (GAUZE/BANDAGES/DRESSINGS) ×2 IMPLANT
DEVICE DUBIN SPECIMEN MAMMOGRA (MISCELLANEOUS) ×3 IMPLANT
DRAPE CHEST BREAST 15X10 FENES (DRAPES) ×3 IMPLANT
DRAPE SURG 17X23 STRL (DRAPES) IMPLANT
DRSG PAD ABDOMINAL 8X10 ST (GAUZE/BANDAGES/DRESSINGS) ×4 IMPLANT
ELECT COATED BLADE 2.86 ST (ELECTRODE) ×3 IMPLANT
ELECT REM PT RETURN 9FT ADLT (ELECTROSURGICAL) ×3
ELECTRODE REM PT RTRN 9FT ADLT (ELECTROSURGICAL) ×2 IMPLANT
GAUZE SPONGE 4X4 12PLY STRL LF (GAUZE/BANDAGES/DRESSINGS) ×3 IMPLANT
GLOVE BIO SURGEON STRL SZ 6 (GLOVE) ×3 IMPLANT
GLOVE INDICATOR 6.5 STRL GRN (GLOVE) ×3 IMPLANT
GOWN STRL REUS W/ TWL LRG LVL3 (GOWN DISPOSABLE) ×2 IMPLANT
GOWN STRL REUS W/TWL 2XL LVL3 (GOWN DISPOSABLE) ×3 IMPLANT
GOWN STRL REUS W/TWL LRG LVL3 (GOWN DISPOSABLE) ×3
KIT BASIN OR (CUSTOM PROCEDURE TRAY) ×3 IMPLANT
KIT MARKER MARGIN INK (KITS) ×3 IMPLANT
NDL 18GX1X1/2 (RX/OR ONLY) (NEEDLE) IMPLANT
NDL FILTER BLUNT 18X1 1/2 (NEEDLE) IMPLANT
NDL HYPO 25GX1X1/2 BEV (NEEDLE) ×2 IMPLANT
NEEDLE 18GX1X1/2 (RX/OR ONLY) (NEEDLE) IMPLANT
NEEDLE FILTER BLUNT 18X 1/2SAF (NEEDLE)
NEEDLE FILTER BLUNT 18X1 1/2 (NEEDLE) IMPLANT
NEEDLE HYPO 25GX1X1/2 BEV (NEEDLE) ×3 IMPLANT
NS IRRIG 1000ML POUR BTL (IV SOLUTION) ×3 IMPLANT
PACK GENERAL/GYN (CUSTOM PROCEDURE TRAY) ×3 IMPLANT
PACK UNIVERSAL I (CUSTOM PROCEDURE TRAY) ×3 IMPLANT
STRIP CLOSURE SKIN 1/2X4 (GAUZE/BANDAGES/DRESSINGS) ×3 IMPLANT
SUT MNCRL AB 4-0 PS2 18 (SUTURE) ×4 IMPLANT
SUT VIC AB 2-0 SH 27 (SUTURE) ×6
SUT VIC AB 2-0 SH 27XBRD (SUTURE) ×2 IMPLANT
SUT VIC AB 3-0 SH 27 (SUTURE) ×9
SUT VIC AB 3-0 SH 27X BRD (SUTURE) ×2 IMPLANT
SYR CONTROL 10ML LL (SYRINGE) ×3 IMPLANT
TOWEL GREEN STERILE (TOWEL DISPOSABLE) ×3 IMPLANT
TOWEL GREEN STERILE FF (TOWEL DISPOSABLE) ×3 IMPLANT

## 2021-09-15 NOTE — Discharge Instructions (Addendum)
[facility] Office Phone Number [phone]  BREAST BIOPSY/ PARTIAL MASTECTOMY: POST OP INSTRUCTIONS  Always review your discharge instruction sheet given to you by the facility where your surgery was performed.  IF YOU HAVE DISABILITY OR FAMILY LEAVE FORMS, YOU MUST BRING THEM TO THE OFFICE FOR PROCESSING.  DO NOT GIVE THEM TO YOUR DOCTOR.  Take 2 tylenol (acetominophen) three times a day for 3 days.  If you still have pain, add ibuprofen with food in between if able to take this (if you have kidney issues or stomach issues, do not take ibuprofen).  If both of those are not enough, add the narcotic pain pill.  If you find you are needing a lot of this overnight after surgery, call the next morning for a refill.    Prescriptions will not be filled after 5pm or on week-ends. Take your usually prescribed medications unless otherwise directed You should eat very light the first 24 hours after surgery, such as soup, crackers, pudding, etc.  Resume your normal diet the day after surgery. Most patients will experience some swelling and bruising in the breast.  Ice packs and a good support bra will help.  Swelling and bruising can take several days to resolve.  It is common to experience some constipation if taking pain medication after surgery.  Increasing fluid intake and taking a stool softener will usually help or prevent this problem from occurring.  A mild laxative (Milk of Magnesia or Miralax) should be taken according to package directions if there are no bowel movements after 48 hours. Unless discharge instructions indicate otherwise, you may remove your bandages 48 hours after surgery, and you may shower at that time.  You may have steri-strips (small skin tapes) in place directly over the incision.  These strips should be left on the skin at least for for 7-10 days.    ACTIVITIES:  You may resume regular daily activities (gradually increasing) beginning the next day.  Wearing a  good support bra or sports bra (or the breast binder) minimizes pain and swelling.  You may have sexual intercourse when it is comfortable. No heavy lifting for 1-2 weeks (not over around 10 pounds).  You may drive when you no longer are taking prescription pain medication, you can comfortably wear a seatbelt, and you can safely maneuver your car and apply brakes. RETURN TO WORK:  __________3-14 days depending on job. _______________ You should see your doctor in the office for a follow-up appointment approximately two weeks after your surgery.  Your doctor's nurse will typically make your follow-up appointment when she calls you with your pathology report.  Expect your pathology report 3-4 business days after your surgery.  You may call to check if you do not hear from us after three days.   WHEN TO CALL YOUR DOCTOR: Fever over 101.0 Nausea and/or vomiting. Extreme swelling or bruising. Continued bleeding from incision. Increased pain, redness, or drainage from the incision.  The clinic staff is available to answer your questions during regular business hours.  Please don't hesitate to call and ask to speak to one of the nurses for clinical concerns.  If you have a medical emergency, go to the nearest emergency room or call 911.  A surgeon from [facility] is always on call at the hospital.  For further questions, please visit [email or web address]   

## 2021-09-15 NOTE — Interval H&P Note (Signed)
History and Physical Interval Note: ? ?09/15/2021 ?11:13 AM ? ?Marie Adams  has presented today for surgery, with the diagnosis of RIGHT BREAST LCIS AND LEFT BREAST ALH.  The various methods of treatment have been discussed with the patient and family. After consideration of risks, benefits and other options for treatment, the patient has consented to  Procedure(s): ?RIGHT BREAST LUMPECTOMY WITH RADIOACTIVE SEED LOCALIZATION (Right) ?RADIOACTIVE SEED GUIDED EXCISIONAL LEFT BREAST BIOPSY (Left) as a surgical intervention.  The patient's history has been reviewed, patient examined, no change in status, stable for surgery.  I have reviewed the patient's chart and labs.  Questions were answered to the patient's satisfaction.   ? ? ?Stark Klein ? ? ?

## 2021-09-15 NOTE — Anesthesia Postprocedure Evaluation (Signed)
Anesthesia Post Note ? ?Patient: Marie Adams ? ?Procedure(s) Performed: RIGHT BREAST LUMPECTOMY WITH RADIOACTIVE SEED LOCALIZATION (Right: Breast) ?RADIOACTIVE SEED GUIDED EXCISIONAL LEFT BREAST BIOPSY (Left: Breast) ? ?  ? ?Patient location during evaluation: PACU ?Anesthesia Type: General ?Level of consciousness: awake ?Pain management: pain level controlled ?Vital Signs Assessment: post-procedure vital signs reviewed and stable ?Respiratory status: spontaneous breathing and respiratory function stable ?Cardiovascular status: stable ?Postop Assessment: no apparent nausea or vomiting ?Anesthetic complications: no ? ? ?No notable events documented. ? ?Last Vitals:  ?Vitals:  ? 09/15/21 1445 09/15/21 1500  ?BP: 121/75 (!) 151/91  ?Pulse: (!) 128 (!) 132  ?Resp: 18 17  ?Temp:    ?SpO2: 92% 91%  ?  ?Last Pain:  ?Vitals:  ? 09/15/21 1500  ?PainSc: 0-No pain  ? ? ?  ?  ?  ?  ?  ?  ? ?Merlinda Frederick ? ? ? ? ?

## 2021-09-15 NOTE — Transfer of Care (Signed)
Immediate Anesthesia Transfer of Care Note ? ?Patient: Marie Adams ? ?Procedure(s) Performed: RIGHT BREAST LUMPECTOMY WITH RADIOACTIVE SEED LOCALIZATION (Right: Breast) ?RADIOACTIVE SEED GUIDED EXCISIONAL LEFT BREAST BIOPSY (Left: Breast) ? ?Patient Location: PACU ? ?Anesthesia Type:General ? ?Level of Consciousness: awake and alert  ? ?Airway & Oxygen Therapy: Patient Spontanous Breathing and Patient connected to face mask oxygen ? ?Post-op Assessment: Report given to RN and Post -op Vital signs reviewed and stable ? ?Post vital signs: Reviewed and stable. Elgie Congo, MD notified of elevated HR. ? ?Last Vitals:  ?Vitals Value Taken Time  ?BP 139/79 09/15/21 1412  ?Temp    ?Pulse 140 09/15/21 1414  ?Resp 19 09/15/21 1414  ?SpO2 94 % 09/15/21 1414  ?Vitals shown include unvalidated device data. ? ?Last Pain:  ?Vitals:  ? 09/15/21 1053  ?PainSc: 0-No pain  ?   ? ?  ? ?Complications: No notable events documented. ?

## 2021-09-15 NOTE — Op Note (Signed)
Right Breast Radioactive seed localized lumpectomy and left breast seed localized excisional biopsy ? ?Indications: This patient presents with history of abnormal right mammogram. Core needle biopsy showed LCIS.  MRI was performed and non mass enhancement was seen on the left.  MR guided biopsy on the left showed ALH.  She presents for excision.   ? ?Pre-operative Diagnosis: Right LCIS, left ALH ? ?Post-operative Diagnosis: same ? ?Surgeon: Stark Klein  ? ?Assistant: Celene Squibb, RNFA ? ?Anesthesia: General endotracheal anesthesia ? ?ASA Class: 2 ? ?Procedure Details  ?The patient was seen in the Holding Room. The risks, benefits, complications, treatment options, and expected outcomes were discussed with the patient. The possibilities of bleeding, infection, the need for additional procedures, failure to diagnose a condition, and creating a complication requiring transfusion or operation were discussed with the patient. The patient concurred with the proposed plan, giving informed consent.  The site of surgery properly noted/marked. The patient was taken to Operating Room # 2, identified, and the procedure verified as Right Breast seed localized excisional biopsy and left breast seed localized excisional biopsy. A Time Out was held and the above information confirmed. ? ?The bilateral breast and chest were prepped and draped in standard fashion.  ? ?The left side was addressed first.  A inferolateral transverse incision was made near the previously placed radioactive seed at the site of a prior incision.  Dissection was carried down around the point of maximum signal intensity. The cautery was used to perform the dissection.   The specimen was inked with the margin marker paint kit.    Specimen radiography confirmed inclusion of the mammographic lesion, the clip, and the seed.  The background signal in the breast was zero.  One clip was placed in the breast cavity.  Hemostasis was achieved with cautery.  The  wound was irrigated and closed with 3-0 vicryl interrupted deep dermal sutures and 4-0 monocryl running subcuticular suture.   ? ?The right side was addressed similarly via a transverse lateral incision.  ?   ?Sterile dressings were applied. At the end of the operation, all sponge, instrument, and needle counts were correct. ? ?Findings: ?Seed, clip in specimen.  anterior margin is skin bilaterally.   ? ?Estimated Blood Loss:  min ?        ?Specimens: Left breast tissue with seed, right breast tissue with seed.  ?        ?Complications:  None; patient tolerated the procedure well. ?        ?Disposition: PACU - hemodynamically stable. ?        ?Condition: stable ? ?

## 2021-09-15 NOTE — Anesthesia Procedure Notes (Signed)
Procedure Name: LMA Insertion ?Date/Time: 09/15/2021 12:09 PM ?Performed by: Erick Colace, CRNA ?Pre-anesthesia Checklist: Patient identified, Emergency Drugs available, Suction available and Patient being monitored ?Patient Re-evaluated:Patient Re-evaluated prior to induction ?Oxygen Delivery Method: Circle system utilized ?Preoxygenation: Pre-oxygenation with 100% oxygen ?Induction Type: IV induction ?Ventilation: Mask ventilation without difficulty ?LMA: LMA inserted ?LMA Size: 4.0 ?Laser Tube: Cuffed inflated with minimal occlusive pressure - saline ?Placement Confirmation: positive ETCO2 and breath sounds checked- equal and bilateral ?Tube secured with: Tape ?Dental Injury: Teeth and Oropharynx as per pre-operative assessment  ? ? ? ? ?

## 2021-09-16 ENCOUNTER — Encounter (HOSPITAL_COMMUNITY): Payer: Self-pay | Admitting: General Surgery

## 2021-09-19 LAB — SURGICAL PATHOLOGY

## 2021-09-20 ENCOUNTER — Encounter (HOSPITAL_COMMUNITY): Payer: Self-pay

## 2021-10-11 ENCOUNTER — Other Ambulatory Visit: Payer: Self-pay | Admitting: General Surgery

## 2021-10-11 DIAGNOSIS — Z9889 Other specified postprocedural states: Secondary | ICD-10-CM

## 2021-10-13 ENCOUNTER — Other Ambulatory Visit: Payer: Self-pay | Admitting: Internal Medicine

## 2021-10-14 ENCOUNTER — Other Ambulatory Visit (INDEPENDENT_AMBULATORY_CARE_PROVIDER_SITE_OTHER): Payer: BC Managed Care – PPO

## 2021-10-14 ENCOUNTER — Ambulatory Visit (INDEPENDENT_AMBULATORY_CARE_PROVIDER_SITE_OTHER): Payer: BC Managed Care – PPO | Admitting: Gastroenterology

## 2021-10-14 ENCOUNTER — Encounter: Payer: Self-pay | Admitting: Gastroenterology

## 2021-10-14 ENCOUNTER — Telehealth: Payer: Self-pay | Admitting: Hematology and Oncology

## 2021-10-14 VITALS — BP 126/88 | HR 90 | Ht 65.0 in | Wt 170.0 lb

## 2021-10-14 DIAGNOSIS — R1011 Right upper quadrant pain: Secondary | ICD-10-CM | POA: Diagnosis not present

## 2021-10-14 DIAGNOSIS — R0789 Other chest pain: Secondary | ICD-10-CM | POA: Diagnosis not present

## 2021-10-14 DIAGNOSIS — K828 Other specified diseases of gallbladder: Secondary | ICD-10-CM | POA: Diagnosis not present

## 2021-10-14 DIAGNOSIS — R12 Heartburn: Secondary | ICD-10-CM

## 2021-10-14 LAB — CBC
HCT: 35.9 % — ABNORMAL LOW (ref 36.0–46.0)
Hemoglobin: 11.7 g/dL — ABNORMAL LOW (ref 12.0–15.0)
MCHC: 32.7 g/dL (ref 30.0–36.0)
MCV: 78.8 fl (ref 78.0–100.0)
Platelets: 388 10*3/uL (ref 150.0–400.0)
RBC: 4.56 Mil/uL (ref 3.87–5.11)
RDW: 16.2 % — ABNORMAL HIGH (ref 11.5–15.5)
WBC: 12.2 10*3/uL — ABNORMAL HIGH (ref 4.0–10.5)

## 2021-10-14 LAB — COMPREHENSIVE METABOLIC PANEL
ALT: 13 U/L (ref 0–35)
AST: 15 U/L (ref 0–37)
Albumin: 4.2 g/dL (ref 3.5–5.2)
Alkaline Phosphatase: 49 U/L (ref 39–117)
BUN: 12 mg/dL (ref 6–23)
CO2: 29 mEq/L (ref 19–32)
Calcium: 9.2 mg/dL (ref 8.4–10.5)
Chloride: 99 mEq/L (ref 96–112)
Creatinine, Ser: 0.77 mg/dL (ref 0.40–1.20)
GFR: 89.08 mL/min (ref >60.00)
Glucose, Bld: 88 mg/dL (ref 70–99)
Potassium: 3.9 mEq/L (ref 3.5–5.1)
Sodium: 136 mEq/L (ref 135–145)
Total Bilirubin: 0.5 mg/dL (ref 0.2–1.2)
Total Protein: 7.5 g/dL (ref 6.0–8.3)

## 2021-10-14 LAB — LIPASE: Lipase: 46 U/L (ref 11.0–59.0)

## 2021-10-14 LAB — AMYLASE: Amylase: 79 U/L (ref 27–131)

## 2021-10-14 NOTE — Progress Notes (Signed)
GASTROENTEROLOGY OUTPATIENT CLINIC VISIT   Primary Care Provider Pincus Sanes, MD [address] [place] [address] [phone]   Patient Profile: Marie Adams is a 51 y.o. female with a pmh significant for hypertension, anxiety, LCIS, gallbladder dyskinesia (based on prior CCK-HIDA) with gallbladder in situ, GERD, diverticulosis, hemorrhoids.  The patient presents to the [facility] for an evaluation and management of problem(s) noted below:  Problem List 1. Pyrosis   2. Atypical chest pain   3. Dyskinesia of gallbladder   4. RUQ pain     History of Present Illness This is the patient's first visit to the outpatient [facility] clinic.  I did meet the patient back in 2021 for a screening colonoscopy.  She was found to have diverticulosis and a polyp that was resected and returned as a hyperplastic polyp from within a diverticulum.  She also had hemorrhoids on that exam.  Her history is interesting that back in 2019 and 2020 she was experiencing more significant issues of right upper quadrant abdominal discomfort and epigastric abdominal pain that was episodic.  She eventually underwent work-up by her primary care provider and that included ultrasound imaging as well as cross-sectional imaging.  This eventually led to a CCK HIDA which showed a significantly depressed ejection fraction of the gallbladder.  The patient transitioned her diet to a more pescatarian diet and in so doing, she decreased the amount of episodes of pain and discomfort that she was experiencing.  She now has that right upper quadrant abdominal discomfort that radiates to her shoulders bilaterally occurring only every few months and she knows certain meals may exacerbate things when they are richer or more fatty than her normal diet.  Her last episode of this was approximately a week ago.  The patient episodes have decreased in time length as well and only last a few minutes to an hour or  so before they abate.  The patient is feeling well currently.  She does worry as to whether there is any risk of maintaining her gallbladder with a known depressed EF, and the risk of potentially developing cholecystitis in the future.  However, she also describes a longer standing issue of intermittent heartburn.  She has used Zantac in the past but transition to Pepcid as a result of the Zantac cancer risks/warnings.  This heartburn can be debilitating for her as it causes a significant pyrosis and chest discomfort to the point where she has to take a whole day off if issues arise.  If issues are significant and burning is significant she will use baking soda and water mixed together and this can help with the discomfort but she subsequently gets diarrhea and feels unwell.  She has not been on regular PPI therapy.  She does not experience heartburn symptoms on a daily basis but as she has concerns, especially when she is traveling, she will take Pepcid during the entire time she is away or if she knows certain meals have a chance of exacerbating symptoms.  She denies any overt dysphagia symptoms or odynophagia symptoms.  She has not had any regurgitation of her food.  She denies any hematemesis or coffee-ground emesis.  She has never had an upper endoscopy.  GI Review of Systems Positive as above Negative for abdominal pain, bloating, alteration of bowel habits, melena, hematochezia  Review of Systems General: Denies fevers/chills/weight loss unintentionally Cardiovascular: Denies chest pain/palpitations Pulmonary: Denies shortness of breath/cough Gastroenterological: See HPI Genitourinary: Denies darkened urine  Dermatological: Denies jaundice Psychological: Mood is stable   Medications Current Outpatient Medications  Medication Sig Dispense Refill   Ca Phosphate-Cholecalciferol (CALCIUM/VITAMIN D3 GUMMIES PO) Take 2 each by mouth daily.     Iron Carbonyl-Vitamin C-FOS (CHEWABLE IRON PO) Take 2  each by mouth daily.     lisinopril (ZESTRIL) 5 MG tablet Take 1 tablet (5 mg total) by mouth daily. 90 tablet 3   loratadine (CLARITIN) 10 MG tablet Take 10 mg by mouth daily.     Multiple Vitamin (MULTIVITAMIN) tablet Take 1 tablet by mouth daily.     PARoxetine (PAXIL) 20 MG tablet TAKE 1 TABLET BY MOUTH EVERY DAY 90 tablet 3   vitamin B-12 (CYANOCOBALAMIN) 500 MCG tablet Take 500 mcg by mouth daily.     No current facility-administered medications for this visit.    Allergies Allergies  Allergen Reactions   Promethazine Hcl Itching   Gadolinium Derivatives Hives    IVE DYE Contrast   Tylenol [Acetaminophen] Hives and Itching    Tylenol and Tylenol PM    Histories Past Medical History:  Diagnosis Date   Anemia    ANXIETY    GERD (gastroesophageal reflux disease)    Heartburn    Hypertension    Past Surgical History:  Procedure Laterality Date   BREAST EXCISIONAL BIOPSY Left    BREAST LUMPECTOMY WITH RADIOACTIVE SEED LOCALIZATION Right 09/15/2021   Procedure: RIGHT BREAST LUMPECTOMY WITH RADIOACTIVE SEED LOCALIZATION;  Surgeon: Stark Klein, MD;  Location: [facility];  Service: General;  Laterality: Right;   BREAST SURGERY  1990   Breast biopsy - benign   RADIOACTIVE SEED GUIDED EXCISIONAL BREAST BIOPSY Left 09/15/2021   Procedure: RADIOACTIVE SEED GUIDED EXCISIONAL LEFT BREAST BIOPSY;  Surgeon: Stark Klein, MD;  Location: [facility] OR;  Service: General;  Laterality: Left;   SIGMOIDOSCOPY  1990's   Social History   Socioeconomic History   Marital status: Married    Spouse name: Not on file   Number of children: Not on file   Years of education: Not on file   Highest education level: Not on file  Occupational History   Not on file  Tobacco Use   Smoking status: Never   Smokeless tobacco: Never  Vaping Use   Vaping Use: Never used  Substance and Sexual Activity   Alcohol use: Yes    Alcohol/week: 9.0 standard drinks    Types: 9 Glasses of wine per week    Comment: 1  glass of wine nightly   Drug use: No   Sexual activity: Not on file  Other Topics Concern   Not on file  Social History Narrative   Lives with wife [name]) 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      Exercise: treadmill   Social Determinants of Health   Financial Resource Strain: Not on file  Food Insecurity: Not on file  Transportation Needs: Not on file  Physical Activity: Not on file  Stress: Not on file  Social Connections: Not on file  Intimate Partner Violence: Not on file   Family History  Problem Relation Age of Onset   Hypertension Mother    Colon polyps Mother    Diabetes Father        borderline   Hyperlipidemia Father    Hypertension Father    Arthritis Maternal Grandmother    Transient ischemic attack Paternal Grandfather    Colon cancer Neg Hx    Esophageal cancer Neg Hx    Stomach cancer Neg Hx    Rectal  cancer Neg Hx    Inflammatory bowel disease Neg Hx    Liver disease Neg Hx    Pancreatic cancer Neg Hx    I have reviewed her medical, social, and family history in detail and updated the electronic medical record as necessary.    PHYSICAL EXAMINATION  BP 126/88   Pulse 90   Ht $R'5\' 5"'SR$  (1.651 m)   Wt 170 lb (77.1 kg)   SpO2 97%   BMI 28.29 kg/m  Wt Readings from Last 3 Encounters:  10/14/21 170 lb (77.1 kg)  09/15/21 160 lb (72.6 kg)  09/08/21 168 lb 1.6 oz (76.2 kg)  GEN: NAD, appears stated age, doesn't appear chronically ill PSYCH: Cooperative, without pressured speech EYE: Conjunctivae pink, sclerae anicteric ENT: MMM CV: Nontachycardic RESP: No audible wheezing GI: NABS, soft, NT/ND, without rebound  MSK/EXT: No lower extremity edema SKIN: No jaundice NEURO:  Alert & Oriented x 3, no focal deficits   REVIEW OF DATA  I reviewed the following data at the time of this encounter:  GI Procedures and Studies  September 2021 colonoscopy - Perianal skin tags found on perianal exam. - Hemorrhoids found on digital rectal exam. - There was  significant looping of the colon. - Diverticulosis in the recto-sigmoid colon, in the sigmoid colon, in the descending colon and in the transverse colon. - One 3 mm polyp in the sigmoid colon found within a diverticulum entirely, removed with mucosal cold resection. Resected and retrieved. - Normal mucosa in the entire examined colon otherwise. - Non-bleeding non-thrombosed internal and external hemorrhoids.  Pathology Diagnosis Surgical [P], colon, sigmoid, polyp (1) - BENIGN COLONIC MUCOSA WITH LYMPHOID AGGREGATE AND LAMINA PROPRIA EDEMA. - NO DYSPLASIA OR MALIGNANCY.  Laboratory Studies  Reviewed those in epic  Imaging Studies  November 2022 CT abdomen pelvis with contrast IMPRESSION: 1. 1 cm lesion in the endometrium, indeterminate. Recommend follow-up pelvic ultrasound for further evaluation. 2. Sigmoid colon diverticulosis without evidence for acute diverticulitis. 3. Unchanged hepatic complex cyst.  May 2021 HIDA IMPRESSION: Patent biliary tree. Abnormal gallbladder response to fatty meal stimulation with a markedly decreased ejection fraction of 6%. September 2020 right upper quadrant ultrasound Other: None. IMPRESSION: 1.  No gallstones or biliary distention. 2. Heterogeneous hepatic parenchymal pattern consistent with fatty infiltration or hepatocellular disease. 3.1 cm stable complex hepatic cyst, most likely benign.   ASSESSMENT  Marie Adams is a 51 y.o. female with a pmh significant for hypertension, anxiety, LCIS, gallbladder dyskinesia (based on prior CCK-HIDA) with gallbladder in situ, GERD, diverticulosis, hemorrhoids.  The patient is seen today for evaluation and management of:  1. Pyrosis   2. Atypical chest pain   3. Dyskinesia of gallbladder   4. RUQ pain    The patient is hemodynamically stable.  In the past she was diagnosed with gallbladder dyskinesia as a result of a significantly depressed EF while having episodes of right upper quadrant pain  radiating to the shoulder.  No evidence of overt cholelithiasis on multiple imaging studies though her last ultrasound imaging was nearly 3 years ago.  Certainly the way she was describing her issues did sound as if gallbladder dyskinesia could be playing a role but it seems that her diet has made a tremendous impact for her.  I recommend she continue her pescatarian diet since she has had such a decreased amount of episodes.  With that being said as she had an episode last week, we will recheck her liver tests and pancreatic enzymes to ensure that  there is no elevation.  It may be reasonable if she has a severe episode to come in for labs including a amylase/lipase and hepatic function panel to see if these elevated within 6 to 12 hours of a severe episode of pain and that may give [place] even more information about the role of getting her gallbladder removed in the future.  I discussed with her that having a depressed EF does not necessarily increase her risk of developing cholecystitis in the future but if she has developed any cholelithiasis we certainly need to keep that in mind as that may be a reason for the development of cholecystitis in the future.  For now we will hold off on surgical referral, though Dr. Barry Dienes is her breast surgeon, I do not think that we need to undergo a cholecystectomy currently.  In the setting of her heartburn symptoms that she has been experiencing, I do recommend that we consider trying to initiate Pepcid on a daily basis.  If she has breakthrough symptoms, while taking Pepcid then PPI therapy will be recommended an EGD will be recommended.  I also recommend that before any cholecystectomy is considered, that we likely perform an upper endoscopy.  We will go ahead and do H. pylori stool antigen testing at this time to rule out H. pylori and treat if we find that is positive.  The patient agrees with this plan of action, as she has no other red flag symptoms we will hold off on  endoscopy scheduling at this time.  She will update [place] on an as-needed basis if issues arise or further work-up is required.  All patient questions were answered to the best of my ability, and the patient agrees to the aforementioned plan of action with follow-up as indicated.   PLAN  Laboratories as outlined below Stool H. pylori antigen to be obtained Then may initiate after stool study has been given, Pepcid daily If breakthrough symptoms of GERD are occurring, then she can initiate on a PPI daily This particular setting would then consider diagnostic endoscopy for breakthrough symptoms Red flag symptoms certainly sooner If cholecystectomy were to be considered in the future for her previously noted gallbladder dyskinesia, I would still recommend a diagnostic endoscopy before referral Amylase/lipase/hepatic function panel should be obtained within 6 to 12 hours of a severe episode of abdominal pain of the right upper quadrant to see if these labs elevate and may give [place] more information to have her gallbladder removed in the future Consider updated right upper quadrant ultrasound imaging if right upper quadrant pain recurs   Orders Placed This Encounter  Procedures   Helicobacter pylori special antigen   Comp Met (CMET)   CBC   Lipase   Amylase    New Prescriptions   No medications on file   Modified Medications   No medications on file    Planned Follow Up No follow-ups on file.   Total Time in Face-to-Face and in Coordination of Care for patient including independent/personal interpretation/review of prior testing, medical history, examination, medication adjustment, communicating results with the patient directly, and documentation within the EHR is 30 minutes.   Justice Britain, MD [facility]oscopy Office # [phone]

## 2021-10-14 NOTE — Telephone Encounter (Signed)
Scheduled appt per 6/1 referral. Pt is aware of appt date and time. Pt is aware to arrive 15 mins prior to appt time and to bring and updated insurance card. Pt is aware of appt location.

## 2021-10-14 NOTE — Patient Instructions (Signed)
Your provider has requested that you go to the basement level for lab work before leaving today. Press "B" on the elevator. The lab is located at the first door on the left as you exit the elevator.  Continue Pepcid daily. You may increase to twice daily if symptoms worsen.   Let us know if you have recurrent RUQ pain or discomfort. Dr Rush Landmark will consider or recommend EGD( Upper Endoscopy) and or Ultrasound.   Follow up as needed. Sooner if needed.   If you are age 21 or older, your body mass index should be between 23-30. Your Body mass index is 28.29 kg/m. If this is out of the aforementioned range listed, please consider follow up with your Primary Care Provider.  If you are age 43 or younger, your body mass index should be between 19-25. Your Body mass index is 28.29 kg/m. If this is out of the aformentioned range listed, please consider follow up with your Primary Care Provider.   ________________________________________________________  The [facility] providers would like to encourage you to use [facility] to communicate with providers for non-urgent requests or questions.  Due to long hold times on the telephone, sending your provider a message by [facility] may be a faster and more efficient way to get a response.  Please allow 48 business hours for a response.  Please remember that this is for non-urgent requests.  _______________________________________________________  Thank you for choosing me and [facility].  Dr. Rush Landmark

## 2021-10-15 ENCOUNTER — Encounter: Payer: Self-pay | Admitting: Gastroenterology

## 2021-10-15 DIAGNOSIS — R0789 Other chest pain: Secondary | ICD-10-CM | POA: Insufficient documentation

## 2021-10-15 DIAGNOSIS — R1011 Right upper quadrant pain: Secondary | ICD-10-CM | POA: Insufficient documentation

## 2021-10-15 DIAGNOSIS — R12 Heartburn: Secondary | ICD-10-CM | POA: Insufficient documentation

## 2021-10-15 DIAGNOSIS — K828 Other specified diseases of gallbladder: Secondary | ICD-10-CM | POA: Insufficient documentation

## 2021-10-17 ENCOUNTER — Other Ambulatory Visit: Payer: Self-pay

## 2021-10-17 ENCOUNTER — Encounter: Payer: Self-pay | Admitting: Gastroenterology

## 2021-10-17 DIAGNOSIS — D649 Anemia, unspecified: Secondary | ICD-10-CM

## 2021-10-24 ENCOUNTER — Encounter: Payer: Self-pay | Admitting: Internal Medicine

## 2021-10-24 NOTE — Patient Instructions (Addendum)
Blood work was ordered.     Medications changes include :   none   Your prescription(s) have been sent to your pharmacy.     Return in about 1 year (around 10/26/2022) for Physical Exam.    Health Maintenance, Female Adopting a healthy lifestyle and getting preventive care are important in promoting health and wellness. Ask your health care provider about: The right schedule for you to have regular tests and exams. Things you can do on your own to prevent diseases and keep yourself healthy. What should I know about diet, weight, and exercise? Eat a healthy diet  Eat a diet that includes plenty of vegetables, fruits, low-fat dairy products, and lean protein. Do not eat a lot of foods that are high in solid fats, added sugars, or sodium. Maintain a healthy weight Body mass index (BMI) is used to identify weight problems. It estimates body fat based on height and weight. Your health care provider can help determine your BMI and help you achieve or maintain a healthy weight. Get regular exercise Get regular exercise. This is one of the most important things you can do for your health. Most adults should: Exercise for at least 150 minutes each week. The exercise should increase your heart rate and make you sweat (moderate-intensity exercise). Do strengthening exercises at least twice a week. This is in addition to the moderate-intensity exercise. Spend less time sitting. Even light physical activity can be beneficial. Watch cholesterol and blood lipids Have your blood tested for lipids and cholesterol at 52 years of age, then have this test every 5 years. Have your cholesterol levels checked more often if: Your lipid or cholesterol levels are high. You are older than 52 years of age. You are at high risk for heart disease. What should I know about cancer screening? Depending on your health history and family history, you may need to have cancer screening at various ages. This  may include screening for: Breast cancer. Cervical cancer. Colorectal cancer. Skin cancer. Lung cancer. What should I know about heart disease, diabetes, and high blood pressure? Blood pressure and heart disease High blood pressure causes heart disease and increases the risk of stroke. This is more likely to develop in people who have high blood pressure readings or are overweight. Have your blood pressure checked: Every 3-5 years if you are 66-23 years of age. Every year if you are 44 years old or older. Diabetes Have regular diabetes screenings. This checks your fasting blood sugar level. Have the screening done: Once every three years after age 74 if you are at a normal weight and have a low risk for diabetes. More often and at a younger age if you are overweight or have a high risk for diabetes. What should I know about preventing infection? Hepatitis B If you have a higher risk for hepatitis B, you should be screened for this virus. Talk with your health care provider to find out if you are at risk for hepatitis B infection. Hepatitis C Testing is recommended for: Everyone born from [birth year] through 1966. Anyone with known risk factors for hepatitis C. Sexually transmitted infections (STIs) Get screened for STIs, including gonorrhea and chlamydia, if: You are sexually active and are younger than 52 years of age. You are older than 52 years of age and your health care provider tells you that you are at risk for this type of infection. Your sexual activity has changed since you were last screened, and you  are at increased risk for chlamydia or gonorrhea. Ask your health care provider if you are at risk. Ask your health care provider about whether you are at high risk for HIV. Your health care provider may recommend a prescription medicine to help prevent HIV infection. If you choose to take medicine to prevent HIV, you should first get tested for HIV. You should then be tested every 3  months for as long as you are taking the medicine. Pregnancy If you are about to stop having your period (premenopausal) and you may become pregnant, seek counseling before you get pregnant. Take 400 to 800 micrograms (mcg) of folic acid every day if you become pregnant. Ask for birth control (contraception) if you want to prevent pregnancy. Osteoporosis and menopause Osteoporosis is a disease in which the bones lose minerals and strength with aging. This can result in bone fractures. If you are 47 years old or older, or if you are at risk for osteoporosis and fractures, ask your health care provider if you should: Be screened for bone loss. Take a calcium or vitamin D supplement to lower your risk of fractures. Be given hormone replacement therapy (HRT) to treat symptoms of menopause. Follow these instructions at home: Alcohol use Do not drink alcohol if: Your health care provider tells you not to drink. You are pregnant, may be pregnant, or are planning to become pregnant. If you drink alcohol: Limit how much you have to: 0-1 drink a day. Know how much alcohol is in your drink. In the [place] one drink equals one 12 oz bottle of beer (355 mL), one 5 oz glass of wine (148 mL), or one 1 oz glass of hard liquor (44 mL). Lifestyle Do not use any products that contain nicotine or tobacco. These products include cigarettes, chewing tobacco, and vaping devices, such as e-cigarettes. If you need help quitting, ask your health care provider. Do not use street drugs. Do not share needles. Ask your health care provider for help if you need support or information about quitting drugs. General instructions Schedule regular health, dental, and eye exams. Stay current with your vaccines. Tell your health care provider if: You often feel depressed. You have ever been abused or do not feel safe at home. Summary Adopting a healthy lifestyle and getting preventive care are important in promoting health  and wellness. Follow your health care provider's instructions about healthy diet, exercising, and getting tested or screened for diseases. Follow your health care provider's instructions on monitoring your cholesterol and blood pressure. This information is not intended to replace advice given to you by your health care provider. Make sure you discuss any questions you have with your health care provider. Document Revised: 09/20/2020 Document Reviewed: 09/20/2020 Elsevier Patient Education  [place].

## 2021-10-24 NOTE — Progress Notes (Signed)
Subjective:    Patient ID: Marie Adams, female    DOB: Aug 08, 1970, 51 y.o.   MRN: [id number]      HPI Marie Adams is here for a Physical exam.   Lobular carcinoma of right breast s/p lumpectomy and radioactive seed localization.  We will see oncology to consider tamoxifen treatment.  Overall doing well.   Medications and allergies reviewed with patient and updated if appropriate.  Current Outpatient Medications on File Prior to Visit  Medication Sig Dispense Refill   Ca Phosphate-Cholecalciferol (CALCIUM/VITAMIN D3 GUMMIES PO) Take 2 each by mouth daily.     Iron Carbonyl-Vitamin C-FOS (CHEWABLE IRON PO) Take 2 each by mouth daily.     lisinopril (ZESTRIL) 5 MG tablet Take 1 tablet (5 mg total) by mouth daily. 90 tablet 3   loratadine (CLARITIN) 10 MG tablet Take 10 mg by mouth daily.     Multiple Vitamin (MULTIVITAMIN) tablet Take 1 tablet by mouth daily.     PARoxetine (PAXIL) 20 MG tablet TAKE 1 TABLET BY MOUTH EVERY DAY 90 tablet 3   vitamin B-12 (CYANOCOBALAMIN) 500 MCG tablet Take 500 mcg by mouth daily.     No current facility-administered medications on file prior to visit.    Review of Systems  Constitutional:  Positive for diaphoresis (nightsweats). Negative for fever.  Eyes:  Negative for visual disturbance.  Respiratory:  Negative for cough, shortness of breath and wheezing.   Cardiovascular:  Negative for chest pain, palpitations and leg swelling.  Gastrointestinal:  Positive for abdominal pain (RUQ on occasion). Negative for blood in stool, constipation, diarrhea and nausea.       Jerrye Bushy - controlled with pepcid  Genitourinary:  Negative for dysuria.       Erratic menses  Musculoskeletal:  Positive for back pain (when she gets up in the morning). Negative for arthralgias.  Skin:  Negative for rash.  Neurological:  Negative for light-headedness and headaches.  Psychiatric/Behavioral:  Negative for dysphoric mood. The patient is not nervous/anxious.         Objective:   Vitals:   10/25/21 1011  BP: 126/80  Pulse: 94  Temp: 97.6 F (36.4 C)  SpO2: 97%   Filed Weights   10/25/21 1011  Weight: 170 lb 6.4 oz (77.3 kg)   Body mass index is 28.36 kg/m.  BP Readings from Last 3 Encounters:  10/25/21 126/80  10/14/21 126/88  09/15/21 (!) 151/91    Wt Readings from Last 3 Encounters:  10/25/21 170 lb 6.4 oz (77.3 kg)  10/14/21 170 lb (77.1 kg)  09/15/21 160 lb (72.6 kg)       Physical Exam Constitutional: She appears well-developed and well-nourished. No distress.  HENT:  Head: Normocephalic and atraumatic.  Right Ear: External ear normal. Normal ear canal and TM Left Ear: External ear normal.  Normal ear canal and TM Mouth/Throat: Oropharynx is clear and moist.  Eyes: Conjunctivae normal.  Neck: Neck supple. No tracheal deviation present. No thyromegaly present.  No carotid bruit  Cardiovascular: Normal rate, regular rhythm and normal heart sounds.   No murmur heard.  No edema. Pulmonary/Chest: Effort normal and breath sounds normal. No respiratory distress. She has no wheezes. She has no rales.  Breast: deferred   Abdominal: Soft. She exhibits no distension. There is no tenderness.  Lymphadenopathy: She has no cervical adenopathy.  Skin: Skin is warm and dry. She is not diaphoretic.  Psychiatric: She has a normal mood and affect. Her behavior is normal.  Lab Results  Component Value Date   WBC 12.2 (H) 10/14/2021   HGB 11.7 (L) 10/14/2021   HCT 35.9 (L) 10/14/2021   PLT 388.0 10/14/2021   GLUCOSE 88 10/14/2021   CHOL 196 10/20/2020   TRIG 106.0 10/20/2020   HDL 57.40 10/20/2020   LDLDIRECT 129.0 04/17/2013   LDLCALC 118 (H) 10/20/2020   ALT 13 10/14/2021   AST 15 10/14/2021   NA 136 10/14/2021   K 3.9 10/14/2021   CL 99 10/14/2021   CREATININE 0.77 10/14/2021   BUN 12 10/14/2021   CO2 29 10/14/2021   TSH 1.98 10/20/2020   HGBA1C 5.3 10/20/2020         Assessment & Plan:   Physical  exam: Screening blood work  ordered Exercise  regular usually-currently not now since she is still recovering from surgery Weight  overweight Substance abuse  none   Reviewed recommended immunizations.   Health Maintenance  Topic Date Due   Hepatitis C Screening  10/20/2053 (Originally 12/28/1987)   INFLUENZA VACCINE  12/13/2021   PAP SMEAR-Modifier  03/12/2022   MAMMOGRAM  05/27/2023   TETANUS/TDAP  05/04/2029   COLONOSCOPY (Pts 45-87yr Insurance coverage will need to be confirmed)  02/11/2030   COVID-19 Vaccine  Completed   HIV Screening  Completed   HPV VACCINES  Aged Out   Zoster Vaccines- Shingrix  Discontinued          See Problem List for Assessment and Plan of chronic medical problems.

## 2021-10-25 ENCOUNTER — Ambulatory Visit (INDEPENDENT_AMBULATORY_CARE_PROVIDER_SITE_OTHER): Payer: Self-pay | Admitting: Internal Medicine

## 2021-10-25 VITALS — BP 126/80 | HR 94 | Temp 97.6°F | Ht 65.0 in | Wt 170.4 lb

## 2021-10-25 DIAGNOSIS — Z0000 Encounter for general adult medical examination without abnormal findings: Secondary | ICD-10-CM

## 2021-10-25 DIAGNOSIS — E782 Mixed hyperlipidemia: Secondary | ICD-10-CM

## 2021-10-25 DIAGNOSIS — F419 Anxiety disorder, unspecified: Secondary | ICD-10-CM

## 2021-10-25 DIAGNOSIS — I10 Essential (primary) hypertension: Secondary | ICD-10-CM

## 2021-10-25 DIAGNOSIS — R739 Hyperglycemia, unspecified: Secondary | ICD-10-CM

## 2021-10-25 DIAGNOSIS — K219 Gastro-esophageal reflux disease without esophagitis: Secondary | ICD-10-CM

## 2021-10-25 DIAGNOSIS — K828 Other specified diseases of gallbladder: Secondary | ICD-10-CM

## 2021-10-25 LAB — COMPREHENSIVE METABOLIC PANEL
ALT: 15 U/L (ref 0–35)
AST: 18 U/L (ref 0–37)
Albumin: 4.4 g/dL (ref 3.5–5.2)
Alkaline Phosphatase: 51 U/L (ref 39–117)
BUN: 10 mg/dL (ref 6–23)
CO2: 30 mEq/L (ref 19–32)
Calcium: 9.7 mg/dL (ref 8.4–10.5)
Chloride: 101 mEq/L (ref 96–112)
Creatinine, Ser: 0.74 mg/dL (ref 0.40–1.20)
GFR: 93.41 mL/min (ref >60.00)
Glucose, Bld: 91 mg/dL (ref 70–99)
Potassium: 4.9 mEq/L (ref 3.5–5.1)
Sodium: 138 mEq/L (ref 135–145)
Total Bilirubin: 0.6 mg/dL (ref 0.2–1.2)
Total Protein: 7.4 g/dL (ref 6.0–8.3)

## 2021-10-25 LAB — LIPID PANEL
Cholesterol: 267 mg/dL — ABNORMAL HIGH (ref 0–200)
HDL: 93.1 mg/dL (ref >39.00)
LDL Cholesterol: 146 mg/dL — ABNORMAL HIGH (ref 0–99)
NonHDL: 174.36
Total CHOL/HDL Ratio: 3
Triglycerides: 143 mg/dL (ref 0.0–149.0)
VLDL: 28.6 mg/dL (ref 0.0–40.0)

## 2021-10-25 LAB — CBC WITH DIFFERENTIAL/PLATELET
Basophils Absolute: 0.1 10*3/uL (ref 0.0–0.1)
Basophils Relative: 0.6 % (ref 0.0–3.0)
Eosinophils Absolute: 0.2 10*3/uL (ref 0.0–0.7)
Eosinophils Relative: 2.1 % (ref 0.0–5.0)
HCT: 37.9 % (ref 36.0–46.0)
Hemoglobin: 12.1 g/dL (ref 12.0–15.0)
Lymphocytes Relative: 18.6 % (ref 12.0–46.0)
Lymphs Abs: 1.8 10*3/uL (ref 0.7–4.0)
MCHC: 32 g/dL (ref 30.0–36.0)
MCV: 79.8 fl (ref 78.0–100.0)
Monocytes Absolute: 0.9 10*3/uL (ref 0.1–1.0)
Monocytes Relative: 9.2 % (ref 3.0–12.0)
Neutro Abs: 6.8 10*3/uL (ref 1.4–7.7)
Neutrophils Relative %: 69.5 % (ref 43.0–77.0)
Platelets: 399 10*3/uL (ref 150.0–400.0)
RBC: 4.75 Mil/uL (ref 3.87–5.11)
RDW: 16.2 % — ABNORMAL HIGH (ref 11.5–15.5)
WBC: 9.8 10*3/uL (ref 4.0–10.5)

## 2021-10-25 LAB — HEMOGLOBIN A1C: Hgb A1c MFr Bld: 5.4 % (ref 4.6–6.5)

## 2021-10-25 MED ORDER — LISINOPRIL 5 MG PO TABS: 5.0000 mg | ORAL_TABLET | Freq: Every day | ORAL | 3 refills | Status: DC

## 2021-10-25 NOTE — Assessment & Plan Note (Signed)
Chronic controlled Taking pepcid 20 mg daily

## 2021-10-25 NOTE — Assessment & Plan Note (Signed)
Chronic Blood pressure well controlled CMP Continue lisinopril 5 mg daily 

## 2021-10-25 NOTE — Assessment & Plan Note (Signed)
Chronic Recent glucose normal Low sugar/carbohydrate diet Regular exercise

## 2021-10-25 NOTE — Assessment & Plan Note (Addendum)
Chronic Having symptoms once every 2 months - if she has symptoms it is when she eats something she should not Has seen GI recently for symptoms consistent with dyskinesia of gallbladder Trying to follow a pescatarian diet If continues to have symptoms will likely need to consider surgery

## 2021-10-25 NOTE — Assessment & Plan Note (Signed)
Chronic Lipids improved last year with diet changes Pescatarian, low-fat/cholesterol diet commended Continue regular exercise

## 2021-10-25 NOTE — Assessment & Plan Note (Signed)
Chronic Controlled, Stable Continue paroxetine 20 mg daily 

## 2021-11-07 ENCOUNTER — Encounter: Payer: Self-pay | Admitting: Gastroenterology

## 2021-11-08 ENCOUNTER — Inpatient Hospital Stay: Payer: Self-pay

## 2021-11-08 ENCOUNTER — Other Ambulatory Visit: Payer: Self-pay

## 2021-11-08 ENCOUNTER — Encounter: Payer: Self-pay | Admitting: Hematology and Oncology

## 2021-11-08 ENCOUNTER — Inpatient Hospital Stay: Payer: Self-pay | Attending: Hematology and Oncology | Admitting: Hematology and Oncology

## 2021-11-08 VITALS — BP 145/90 | HR 117 | Temp 97.3°F | Resp 19 | Ht 66.0 in | Wt 170.2 lb

## 2021-11-08 DIAGNOSIS — D0501 Lobular carcinoma in situ of right breast: Secondary | ICD-10-CM | POA: Insufficient documentation

## 2021-11-08 DIAGNOSIS — Z8041 Family history of malignant neoplasm of ovary: Secondary | ICD-10-CM | POA: Insufficient documentation

## 2021-11-08 DIAGNOSIS — I10 Essential (primary) hypertension: Secondary | ICD-10-CM | POA: Insufficient documentation

## 2021-11-08 MED ORDER — TAMOXIFEN CITRATE 10 MG PO TABS: 5.0000 mg | ORAL_TABLET | Freq: Every day | ORAL | 3 refills | Status: DC

## 2021-11-09 ENCOUNTER — Telehealth: Payer: Self-pay | Admitting: Hematology and Oncology

## 2021-11-09 NOTE — Telephone Encounter (Signed)
.  Called pt per 6/28 inbasket , Patient was unavailable, a message with appt time and date was left with number on file.

## 2021-11-18 ENCOUNTER — Other Ambulatory Visit: Payer: Self-pay

## 2021-11-18 DIAGNOSIS — R0789 Other chest pain: Secondary | ICD-10-CM

## 2021-11-18 DIAGNOSIS — R12 Heartburn: Secondary | ICD-10-CM

## 2021-11-21 LAB — HELICOBACTER PYLORI  SPECIAL ANTIGEN
MICRO NUMBER:: 13617754
SPECIMEN QUALITY: ADEQUATE

## 2021-12-19 ENCOUNTER — Other Ambulatory Visit: Payer: Self-pay

## 2021-12-19 ENCOUNTER — Encounter: Payer: Self-pay | Admitting: Genetic Counselor

## 2022-02-01 NOTE — Progress Notes (Signed)
[facility] NOTE  Patient Care Team: Binnie Rail, MD as PCP - General (Internal Medicine) Dian Queen, MD (Obstetrics and Gynecology)  CHIEF COMPLAINTS/PURPOSE OF CONSULTATION:  Newly diagnosed breast cancer  HISTORY OF PRESENTING ILLNESS:  Marie Adams 51 y.o. female is here because of recent diagnosis of right breast LCIS  I reviewed her records extensively and collaborated the history with the patient.  SUMMARY OF ONCOLOGIC HISTORY: Oncology History  Lobular carcinoma in situ (LCIS) of right breast s/p lumpectomy, seed implantation  04/22/2021 Mammogram   Screening mammogram showed calcifications in the right breast warranting further evaluation with magnified views.  In the left breast no findings suspicious for malignancy.  Diagnostic mammogram showed indeterminate 4 mm group of calcifications in the upper outer right breast.  MR bilateral breast showed 1.8 cm linear non-mass enhancement in the outer lower left breast.   06/23/2021 Initial Diagnosis   Lobular carcinoma in situ (LCIS) of right breast s/p lumpectomy, seed implantation   09/15/2021 Pathology Results   Pathology from left breast lumpectomy showed focal atypical lobular hyperplasia fibrocystic changes.  Right breast lumpectomy showed lobular carcinoma in situ, fibrocystic changes    She is here for follow up on low dose tamoxifen. Since last visit, she had a menstrual cycle in August.  She is otherwise tolerating tamoxifen really well.  No new health complaints.  Rest of the pertinent 10 point ROS reviewed and negative  MEDICAL HISTORY:  Past Medical History:  Diagnosis Date   Anemia    ANXIETY    GERD (gastroesophageal reflux disease)    Heartburn    Hypertension     SURGICAL HISTORY: Past Surgical History:  Procedure Laterality Date   BREAST EXCISIONAL BIOPSY Left    BREAST LUMPECTOMY WITH RADIOACTIVE SEED LOCALIZATION Right 09/15/2021   Procedure: RIGHT BREAST LUMPECTOMY  WITH RADIOACTIVE SEED LOCALIZATION;  Surgeon: Stark Klein, MD;  Location: [place];  Service: General;  Laterality: Right;   BREAST SURGERY  1990   Breast biopsy - benign   RADIOACTIVE SEED GUIDED EXCISIONAL BREAST BIOPSY Left 09/15/2021   Procedure: RADIOACTIVE SEED GUIDED EXCISIONAL LEFT BREAST BIOPSY;  Surgeon: Stark Klein, MD;  Location: [facility];  Service: General;  Laterality: Left;   SIGMOIDOSCOPY  1990's    SOCIAL HISTORY: Social History   Socioeconomic History   Marital status: Married    Spouse name: Not on file   Number of children: Not on file   Years of education: Not on file   Highest education level: Not on file  Occupational History   Not on file  Tobacco Use   Smoking status: Never   Smokeless tobacco: Never  Vaping Use   Vaping Use: Never used  Substance and Sexual Activity   Alcohol use: Yes    Alcohol/week: 9.0 standard drinks of alcohol    Types: 9 Glasses of wine per week    Comment: 1 glass of wine nightly   Drug use: No   Sexual activity: Not on file  Other Topics Concern   Not on file  Social History Narrative   Lives with wife [name]) 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      Exercise: treadmill   Social Determinants of Health   Financial Resource Strain: Not on file  Food Insecurity: Not on file  Transportation Needs: Not on file  Physical Activity: Not on file  Stress: Not on file  Social Connections: Not on file  Intimate Partner Violence: Not on file    FAMILY  HISTORY: Family History  Problem Relation Age of Onset   Hypertension Mother    Colon polyps Mother    Diabetes Father        borderline   Hyperlipidemia Father    Hypertension Father    Arthritis Maternal Grandmother    Transient ischemic attack Paternal Grandfather    Colon cancer Neg Hx    Esophageal cancer Neg Hx    Stomach cancer Neg Hx    Rectal cancer Neg Hx    Inflammatory bowel disease Neg Hx    Liver disease Neg Hx    Pancreatic cancer Neg Hx     ALLERGIES:  is  allergic to promethazine hcl and gadolinium derivatives.  MEDICATIONS:  Current Outpatient Medications  Medication Sig Dispense Refill   famotidine (PEPCID) 20 MG tablet Take 20 mg by mouth daily.     Ca Phosphate-Cholecalciferol (CALCIUM/VITAMIN D3 GUMMIES PO) Take 2 each by mouth daily.     Iron Carbonyl-Vitamin C-FOS (CHEWABLE IRON PO) Take 2 each by mouth daily.     lisinopril (ZESTRIL) 5 MG tablet Take 1 tablet (5 mg total) by mouth daily. 90 tablet 3   Multiple Vitamin (MULTIVITAMIN) tablet Take 1 tablet by mouth daily.     PARoxetine (PAXIL) 20 MG tablet TAKE 1 TABLET BY MOUTH EVERY DAY 90 tablet 3   tamoxifen (NOLVADEX) 10 MG tablet Take 0.5 tablets (5 mg total) by mouth daily. 30 tablet 3   vitamin B-12 (CYANOCOBALAMIN) 500 MCG tablet Take 500 mcg by mouth daily.     No current facility-administered medications for this visit.    REVIEW OF SYSTEMS:   Constitutional: Denies fevers, chills or abnormal night sweats Eyes: Denies blurriness of vision, double vision or watery eyes Ears, nose, mouth, throat, and face: Denies mucositis or sore throat Respiratory: Denies cough, dyspnea or wheezes Cardiovascular: Denies palpitation, chest discomfort or lower extremity swelling Gastrointestinal:  Denies nausea, heartburn or change in bowel habits Skin: Denies abnormal skin rashes Lymphatics: Denies new lymphadenopathy or easy bruising Neurological:Denies numbness, tingling or new weaknesses Behavioral/Psych: Mood is stable, no new changes  Breast: Denies any palpable lumps or discharge All other systems were reviewed with the patient and are negative.  PHYSICAL EXAMINATION: ECOG PERFORMANCE STATUS: 0 - Asymptomatic  Vitals:   02/08/22 1259  BP: 124/79  Pulse: (!) 106  Resp: 18  Temp: 98.1 F (36.7 C)  SpO2: 100%    Filed Weights   02/08/22 1259  Weight: 167 lb (75.8 kg)     GENERAL:alert, no distress and comfortable Breast: Bilateral breasts with post op changes No  regional adenopathy. No lower extremity swelling   LABORATORY DATA:  I have reviewed the data as listed Lab Results  Component Value Date   WBC 9.8 10/25/2021   HGB 12.1 10/25/2021   HCT 37.9 10/25/2021   MCV 79.8 10/25/2021   PLT 399.0 10/25/2021   Lab Results  Component Value Date   NA 138 10/25/2021   K 4.9 10/25/2021   CL 101 10/25/2021   CO2 30 10/25/2021    RADIOGRAPHIC STUDIES: I have personally reviewed the radiological reports and agreed with the findings in the report.  ASSESSMENT AND PLAN:  Lobular carcinoma in situ (LCIS) of right breast s/p lumpectomy, seed implantation This is a very pleasant 51 year old perimenopausal female patient with recently diagnosed with left breast focal atypical lobular hyperplasia and right breast LCIS referred to breast clinic for additional recommendations.  She is now on tamoxifen for history of LCIS.  She is tolerating the 5 mg p.o. daily very well.Since she is perimenopausal/premenopausal, we will try a dose escalating to 10 mg daily and if she tolerates it well, she will try full dose.  She is okay with this approach.  She was clearly instructed to call us with any new questions or concerns.  She will otherwise proceed with mammogram of bilateral breasts in May 2024.  No concerning physical examination findings. Return to clinic in 6 months or sooner as needed  Total time spent: 30 minutes including history, physical exam, review of records, counseling and coordination of  All questions were answered. The patient knows to call the clinic with any problems, questions or concerns.    Benay Pike, MD 02/08/22

## 2022-02-08 ENCOUNTER — Encounter: Payer: Self-pay | Admitting: Hematology and Oncology

## 2022-02-08 ENCOUNTER — Inpatient Hospital Stay: Payer: BC Managed Care – PPO | Attending: Hematology and Oncology | Admitting: Hematology and Oncology

## 2022-02-08 ENCOUNTER — Other Ambulatory Visit: Payer: Self-pay

## 2022-02-08 DIAGNOSIS — C50911 Malignant neoplasm of unspecified site of right female breast: Secondary | ICD-10-CM | POA: Insufficient documentation

## 2022-02-08 DIAGNOSIS — Z79810 Long term (current) use of selective estrogen receptor modulators (SERMs): Secondary | ICD-10-CM | POA: Insufficient documentation

## 2022-02-08 DIAGNOSIS — D0501 Lobular carcinoma in situ of right breast: Secondary | ICD-10-CM

## 2022-02-08 DIAGNOSIS — I10 Essential (primary) hypertension: Secondary | ICD-10-CM | POA: Diagnosis not present

## 2022-02-08 NOTE — Assessment & Plan Note (Signed)
This is a very pleasant 51 year old perimenopausal female patient with recently diagnosed with left breast focal atypical lobular hyperplasia and right breast LCIS referred to breast clinic for additional recommendations.  She is now on tamoxifen for history of LCIS.  She is tolerating the 5 mg p.o. daily very well.Since she is perimenopausal/premenopausal, we will try a dose escalating to 10 mg daily and if she tolerates it well, she will try full dose.  She is okay with this approach.  She was clearly instructed to call us with any new questions or concerns.  She will otherwise proceed with mammogram of bilateral breasts in May 2024.  No concerning physical examination findings. Return to clinic in 6 months or sooner as needed

## 2022-03-23 ENCOUNTER — Other Ambulatory Visit: Payer: Self-pay | Admitting: Hematology and Oncology

## 2022-03-24 ENCOUNTER — Other Ambulatory Visit: Payer: Self-pay | Admitting: *Deleted

## 2022-03-24 MED ORDER — TAMOXIFEN CITRATE 10 MG PO TABS: 10.0000 mg | ORAL_TABLET | Freq: Every day | ORAL | 3 refills | Status: DC

## 2022-03-27 NOTE — Telephone Encounter (Signed)
Filled 3 days ago. Aranza Geddes M Payten Hobin, RN  

## 2022-05-12 ENCOUNTER — Other Ambulatory Visit: Payer: Self-pay | Admitting: Hematology and Oncology

## 2022-05-14 NOTE — Telephone Encounter (Signed)
Per last OV, continue 10 mg until March 2024 appointment. Gardiner Rhyme, RN

## 2022-05-31 DIAGNOSIS — D0501 Lobular carcinoma in situ of right breast: Secondary | ICD-10-CM | POA: Diagnosis not present

## 2022-05-31 DIAGNOSIS — Z8041 Family history of malignant neoplasm of ovary: Secondary | ICD-10-CM | POA: Diagnosis not present

## 2022-05-31 DIAGNOSIS — N6091 Unspecified benign mammary dysplasia of right breast: Secondary | ICD-10-CM | POA: Diagnosis not present

## 2022-06-26 ENCOUNTER — Encounter: Payer: Self-pay | Admitting: Hematology and Oncology

## 2022-06-27 ENCOUNTER — Other Ambulatory Visit: Payer: Self-pay

## 2022-06-27 MED ORDER — TAMOXIFEN CITRATE 20 MG PO TABS: 20.0000 mg | ORAL_TABLET | Freq: Every day | ORAL | 3 refills | Status: DC

## 2022-07-05 DIAGNOSIS — Z6827 Body mass index (BMI) 27.0-27.9, adult: Secondary | ICD-10-CM | POA: Diagnosis not present

## 2022-07-05 DIAGNOSIS — Z124 Encounter for screening for malignant neoplasm of cervix: Secondary | ICD-10-CM | POA: Diagnosis not present

## 2022-07-05 DIAGNOSIS — Z01419 Encounter for gynecological examination (general) (routine) without abnormal findings: Secondary | ICD-10-CM | POA: Diagnosis not present

## 2022-08-09 ENCOUNTER — Ambulatory Visit: Payer: BC Managed Care – PPO | Admitting: Hematology and Oncology

## 2022-08-17 ENCOUNTER — Inpatient Hospital Stay: Payer: BC Managed Care – PPO | Attending: Hematology and Oncology | Admitting: Hematology and Oncology

## 2022-08-17 ENCOUNTER — Other Ambulatory Visit: Payer: Self-pay

## 2022-08-17 VITALS — BP 137/67 | HR 90 | Temp 98.3°F | Resp 13 | Wt 164.4 lb

## 2022-08-17 DIAGNOSIS — D0501 Lobular carcinoma in situ of right breast: Secondary | ICD-10-CM | POA: Diagnosis not present

## 2022-08-17 DIAGNOSIS — Z79810 Long term (current) use of selective estrogen receptor modulators (SERMs): Secondary | ICD-10-CM | POA: Diagnosis not present

## 2022-08-17 NOTE — Progress Notes (Signed)
[place] NOTE  Patient Care Team: Binnie Rail, MD as PCP - General (Internal Medicine) Dian Queen, MD (Obstetrics and Gynecology)  CHIEF COMPLAINTS/PURPOSE OF CONSULTATION:  LCIS  HISTORY OF PRESENTING ILLNESS:  Marie Adams 52 y.o. female is here because of recent diagnosis of right breast LCIS  I reviewed her records extensively and collaborated the history with the patient.  SUMMARY OF ONCOLOGIC HISTORY: Oncology History  Lobular carcinoma in situ (LCIS) of right breast s/p lumpectomy, seed implantation  04/22/2021 Mammogram   Screening mammogram showed calcifications in the right breast warranting further evaluation with magnified views.  In the left breast no findings suspicious for malignancy.  Diagnostic mammogram showed indeterminate 4 mm group of calcifications in the upper outer right breast.  MR bilateral breast showed 1.8 cm linear non-mass enhancement in the outer lower left breast.   06/23/2021 Initial Diagnosis   Lobular carcinoma in situ (LCIS) of right breast s/p lumpectomy, seed implantation   09/15/2021 Pathology Results   Pathology from left breast lumpectomy showed focal atypical lobular hyperplasia fibrocystic changes.  Right breast lumpectomy showed lobular carcinoma in situ, fibrocystic changes    She is here for follow up on tamoxifen. Since last visit, she had a menstrual cycle in Feb 2024, short one and heavy.  She is otherwise tolerating tamoxifen really well.  No new health complaints.  Rest of the pertinent 10 point ROS reviewed and negative  MEDICAL HISTORY:  Past Medical History:  Diagnosis Date   Anemia    ANXIETY    GERD (gastroesophageal reflux disease)    Heartburn    Hypertension     SURGICAL HISTORY: Past Surgical History:  Procedure Laterality Date   BREAST EXCISIONAL BIOPSY Left    BREAST LUMPECTOMY WITH RADIOACTIVE SEED LOCALIZATION Right 09/15/2021   Procedure: RIGHT BREAST LUMPECTOMY WITH  RADIOACTIVE SEED LOCALIZATION;  Surgeon: Stark Klein, MD;  Location: [facility];  Service: General;  Laterality: Right;   BREAST SURGERY  1990   Breast biopsy - benign   RADIOACTIVE SEED GUIDED EXCISIONAL BREAST BIOPSY Left 09/15/2021   Procedure: RADIOACTIVE SEED GUIDED EXCISIONAL LEFT BREAST BIOPSY;  Surgeon: Stark Klein, MD;  Location: [facility];  Service: General;  Laterality: Left;   SIGMOIDOSCOPY  1990's    SOCIAL HISTORY: Social History   Socioeconomic History   Marital status: Married    Spouse name: Not on file   Number of children: Not on file   Years of education: Not on file   Highest education level: Not on file  Occupational History   Not on file  Tobacco Use   Smoking status: Never   Smokeless tobacco: Never  Vaping Use   Vaping Use: Never used  Substance and Sexual Activity   Alcohol use: Yes    Alcohol/week: 9.0 standard drinks of alcohol    Types: 9 Glasses of wine per week    Comment: 1 glass of wine nightly   Drug use: No   Sexual activity: Not on file  Other Topics Concern   Not on file  Social History Narrative   Lives with wife [name]) 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      Exercise: treadmill   Social Determinants of Health   Financial Resource Strain: Not on file  Food Insecurity: Not on file  Transportation Needs: Not on file  Physical Activity: Not on file  Stress: Not on file  Social Connections: Not on file  Intimate Partner Violence: Not on file    FAMILY  HISTORY: Family History  Problem Relation Age of Onset   Hypertension Mother    Colon polyps Mother    Diabetes Father        borderline   Hyperlipidemia Father    Hypertension Father    Arthritis Maternal Grandmother    Transient ischemic attack Paternal Grandfather    Colon cancer Neg Hx    Esophageal cancer Neg Hx    Stomach cancer Neg Hx    Rectal cancer Neg Hx    Inflammatory bowel disease Neg Hx    Liver disease Neg Hx    Pancreatic cancer Neg Hx     ALLERGIES:  is allergic  to promethazine hcl and gadolinium derivatives.  MEDICATIONS:  Current Outpatient Medications  Medication Sig Dispense Refill   Ca Phosphate-Cholecalciferol (CALCIUM/VITAMIN D3 GUMMIES PO) Take 2 each by mouth daily.     famotidine (PEPCID) 20 MG tablet Take 20 mg by mouth daily.     Iron Carbonyl-Vitamin C-FOS (CHEWABLE IRON PO) Take 2 each by mouth daily.     lisinopril (ZESTRIL) 5 MG tablet Take 1 tablet (5 mg total) by mouth daily. 90 tablet 3   Multiple Vitamin (MULTIVITAMIN) tablet Take 1 tablet by mouth daily.     PARoxetine (PAXIL) 20 MG tablet TAKE 1 TABLET BY MOUTH EVERY DAY 90 tablet 3   tamoxifen (NOLVADEX) 20 MG tablet Take 1 tablet (20 mg total) by mouth daily. 90 tablet 3   vitamin B-12 (CYANOCOBALAMIN) 500 MCG tablet Take 500 mcg by mouth daily.     No current facility-administered medications for this visit.    REVIEW OF SYSTEMS:   Constitutional: Denies fevers, chills or abnormal night sweats Eyes: Denies blurriness of vision, double vision or watery eyes Ears, nose, mouth, throat, and face: Denies mucositis or sore throat Respiratory: Denies cough, dyspnea or wheezes Cardiovascular: Denies palpitation, chest discomfort or lower extremity swelling Gastrointestinal:  Denies nausea, heartburn or change in bowel habits Skin: Denies abnormal skin rashes Lymphatics: Denies new lymphadenopathy or easy bruising Neurological:Denies numbness, tingling or new weaknesses Behavioral/Psych: Mood is stable, no new changes  Breast: Denies any palpable lumps or discharge All other systems were reviewed with the patient and are negative.  PHYSICAL EXAMINATION: ECOG PERFORMANCE STATUS: 0 - Asymptomatic  Vitals:   08/17/22 1329  BP: 137/67  Pulse: 90  Resp: 13  Temp: 98.3 F (36.8 C)  SpO2: 98%    Filed Weights   08/17/22 1329  Weight: 164 lb 6.4 oz (74.6 kg)     GENERAL:alert, no distress and comfortable Breast: Bilateral breasts with post op changes No regional  adenopathy. No lower extremity swelling   LABORATORY DATA:  I have reviewed the data as listed Lab Results  Component Value Date   WBC 9.8 10/25/2021   HGB 12.1 10/25/2021   HCT 37.9 10/25/2021   MCV 79.8 10/25/2021   PLT 399.0 10/25/2021   Lab Results  Component Value Date   NA 138 10/25/2021   K 4.9 10/25/2021   CL 101 10/25/2021   CO2 30 10/25/2021    RADIOGRAPHIC STUDIES: I have personally reviewed the radiological reports and agreed with the findings in the report.   ASSESSMENT AND PLAN:  Lobular carcinoma in situ (LCIS) of right breast s/p lumpectomy  This is a very pleasant 52 year old perimenopausal female patient with recently diagnosed with left breast focal atypical lobular hyperplasia and right breast LCIS referred to breast clinic for additional recommendations. She is now on adjuvant tamoxifen.  She is tolerating  this really well, no concerns.  No adverse effects reported.  Physical examination today without any concerns, bilateral breast postop changes noted. We have previously discussed about MRIs as well as mammograms but she is allergic to gadolinium and she is scheduled for mammogram on 09/14/2022. Return to clinic in 6 months or sooner as needed.   Thank you for consulting [place] in the care of this patient.  Please not hesitate to contact us with any additional questions or concerns.     All questions were answered. The patient knows to call the clinic with any problems, questions or concerns. Total time spent: 30 minutes including history, physical exam, review of records, counseling and coordination of  All questions were answered. The patient knows to call the clinic with any problems, questions or concerns.    Benay Pike, MD 08/17/22

## 2022-09-14 ENCOUNTER — Ambulatory Visit
Admission: RE | Admit: 2022-09-14 | Discharge: 2022-09-14 | Disposition: A | Discharge status: Home / Self Care | Payer: BC Managed Care – PPO | Source: Ambulatory Visit | Attending: General Surgery | Admitting: General Surgery

## 2022-09-14 DIAGNOSIS — Z9889 Other specified postprocedural states: Secondary | ICD-10-CM

## 2022-09-14 DIAGNOSIS — N6092 Unspecified benign mammary dysplasia of left breast: Secondary | ICD-10-CM | POA: Diagnosis not present

## 2022-09-29 ENCOUNTER — Other Ambulatory Visit: Payer: Self-pay | Admitting: Internal Medicine

## 2022-10-01 ENCOUNTER — Other Ambulatory Visit: Payer: Self-pay | Admitting: Internal Medicine

## 2022-10-06 DIAGNOSIS — R350 Frequency of micturition: Secondary | ICD-10-CM | POA: Diagnosis not present

## 2022-10-06 DIAGNOSIS — R3989 Other symptoms and signs involving the genitourinary system: Secondary | ICD-10-CM | POA: Diagnosis not present

## 2022-10-06 DIAGNOSIS — N760 Acute vaginitis: Secondary | ICD-10-CM | POA: Diagnosis not present

## 2022-10-06 DIAGNOSIS — N390 Urinary tract infection, site not specified: Secondary | ICD-10-CM | POA: Diagnosis not present

## 2022-10-24 ENCOUNTER — Other Ambulatory Visit: Payer: Self-pay | Admitting: Internal Medicine

## 2022-10-25 ENCOUNTER — Encounter: Payer: Self-pay | Admitting: Internal Medicine

## 2022-10-25 ENCOUNTER — Other Ambulatory Visit: Payer: Self-pay | Admitting: Internal Medicine

## 2022-10-26 ENCOUNTER — Other Ambulatory Visit: Payer: Self-pay | Admitting: Internal Medicine

## 2022-10-26 ENCOUNTER — Telehealth: Payer: Self-pay | Admitting: Internal Medicine

## 2022-10-26 NOTE — Telephone Encounter (Signed)
Refill request has already been sent to Dr. Lawerance Bach.

## 2022-10-26 NOTE — Telephone Encounter (Signed)
Prescription Request  10/26/2022  LOV: Visit date not found  What is the name of the medication or equipment? PARoxetine (PAXIL) 20 MG tablet   Have you contacted your pharmacy to request a refill? No   Which pharmacy would you like this sent to?  [id number] - [place], [place] - [address] [address] Phone: [phone] Fax: [phone]    Patient notified that their request is being sent to the clinical staff for review and that they should receive a response within 2 business days.   Please advise at Mobile [phone] (mobile)

## 2022-11-08 ENCOUNTER — Encounter: Payer: Self-pay | Admitting: Internal Medicine

## 2022-11-08 NOTE — Patient Instructions (Addendum)
An EKG was done.   Blood work was ordered.   The lab is on the first floor.    Medications changes include :   cough syrup     Return in about 1 year (around 11/09/2023) for Physical Exam.   Health Maintenance, Female Adopting a healthy lifestyle and getting preventive care are important in promoting health and wellness. Ask your health care provider about: The right schedule for you to have regular tests and exams. Things you can do on your own to prevent diseases and keep yourself healthy. What should I know about diet, weight, and exercise? Eat a healthy diet  Eat a diet that includes plenty of vegetables, fruits, low-fat dairy products, and lean protein. Do not eat a lot of foods that are high in solid fats, added sugars, or sodium. Maintain a healthy weight Body mass index (BMI) is used to identify weight problems. It estimates body fat based on height and weight. Your health care provider can help determine your BMI and help you achieve or maintain a healthy weight. Get regular exercise Get regular exercise. This is one of the most important things you can do for your health. Most adults should: Exercise for at least 150 minutes each week. The exercise should increase your heart rate and make you sweat (moderate-intensity exercise). Do strengthening exercises at least twice a week. This is in addition to the moderate-intensity exercise. Spend less time sitting. Even light physical activity can be beneficial. Watch cholesterol and blood lipids Have your blood tested for lipids and cholesterol at 53 years of age, then have this test every 5 years. Have your cholesterol levels checked more often if: Your lipid or cholesterol levels are high. You are older than 53 years of age. You are at high risk for heart disease. What should I know about cancer screening? Depending on your health history and family history, you may need to have cancer screening at various ages. This may  include screening for: Breast cancer. Cervical cancer. Colorectal cancer. Skin cancer. Lung cancer. What should I know about heart disease, diabetes, and high blood pressure? Blood pressure and heart disease High blood pressure causes heart disease and increases the risk of stroke. This is more likely to develop in people who have high blood pressure readings or are overweight. Have your blood pressure checked: Every 3-5 years if you are 37-64 years of age. Every year if you are 9 years old or older. Diabetes Have regular diabetes screenings. This checks your fasting blood sugar level. Have the screening done: Once every three years after age 52 if you are at a normal weight and have a low risk for diabetes. More often and at a younger age if you are overweight or have a high risk for diabetes. What should I know about preventing infection? Hepatitis B If you have a higher risk for hepatitis B, you should be screened for this virus. Talk with your health care provider to find out if you are at risk for hepatitis B infection. Hepatitis C Testing is recommended for: Everyone born from [birth year] through 1966. Anyone with known risk factors for hepatitis C. Sexually transmitted infections (STIs) Get screened for STIs, including gonorrhea and chlamydia, if: You are sexually active and are younger than 53 years of age. You are older than 52 years of age and your health care provider tells you that you are at risk for this type of infection. Your sexual activity has changed since you were last screened,  and you are at increased risk for chlamydia or gonorrhea. Ask your health care provider if you are at risk. Ask your health care provider about whether you are at high risk for HIV. Your health care provider may recommend a prescription medicine to help prevent HIV infection. If you choose to take medicine to prevent HIV, you should first get tested for HIV. You should then be tested every 3 months  for as long as you are taking the medicine. Pregnancy If you are about to stop having your period (premenopausal) and you may become pregnant, seek counseling before you get pregnant. Take 400 to 800 micrograms (mcg) of folic acid every day if you become pregnant. Ask for birth control (contraception) if you want to prevent pregnancy. Osteoporosis and menopause Osteoporosis is a disease in which the bones lose minerals and strength with aging. This can result in bone fractures. If you are 56 years old or older, or if you are at risk for osteoporosis and fractures, ask your health care provider if you should: Be screened for bone loss. Take a calcium or vitamin D supplement to lower your risk of fractures. Be given hormone replacement therapy (HRT) to treat symptoms of menopause. Follow these instructions at home: Alcohol use Do not drink alcohol if: Your health care provider tells you not to drink. You are pregnant, may be pregnant, or are planning to become pregnant. If you drink alcohol: Limit how much you have to: 0-1 drink a day. Know how much alcohol is in your drink. In the [place] one drink equals one 12 oz bottle of beer (355 mL), one 5 oz glass of wine (148 mL), or one 1 oz glass of hard liquor (44 mL). Lifestyle Do not use any products that contain nicotine or tobacco. These products include cigarettes, chewing tobacco, and vaping devices, such as e-cigarettes. If you need help quitting, ask your health care provider. Do not use street drugs. Do not share needles. Ask your health care provider for help if you need support or information about quitting drugs. General instructions Schedule regular health, dental, and eye exams. Stay current with your vaccines. Tell your health care provider if: You often feel depressed. You have ever been abused or do not feel safe at home. Summary Adopting a healthy lifestyle and getting preventive care are important in promoting health and  wellness. Follow your health care provider's instructions about healthy diet, exercising, and getting tested or screened for diseases. Follow your health care provider's instructions on monitoring your cholesterol and blood pressure. This information is not intended to replace advice given to you by your health care provider. Make sure you discuss any questions you have with your health care provider. Document Revised: 09/20/2020 Document Reviewed: 09/20/2020 Elsevier Patient Education  2024 [employer].

## 2022-11-08 NOTE — Progress Notes (Unsigned)
Subjective:    Patient ID: Marie Adams, female    DOB: 1970-06-17, 52 y.o.   MRN: [id number]      HPI Marie Adams is here for a Physical exam and her chronic medical problems.   Cold symptoms - a week or so.  It is gone through her whole family and is just taking a long time to go away.  Overall she feels okay-just has congestion and cough-worse at night.   Medications and allergies reviewed with patient and updated if appropriate.  Current Outpatient Medications on File Prior to Visit  Medication Sig Dispense Refill   Ca Phosphate-Cholecalciferol (CALCIUM/VITAMIN D3 GUMMIES PO) Take 2 each by mouth daily.     famotidine (PEPCID) 20 MG tablet Take 20 mg by mouth daily.     Iron Carbonyl-Vitamin C-FOS (CHEWABLE IRON PO) Take 2 each by mouth daily.     lisinopril (ZESTRIL) 5 MG tablet TAKE 1 TABLET (5 MG TOTAL) BY MOUTH DAILY. 90 tablet 0   Multiple Vitamin (MULTIVITAMIN) tablet Take 1 tablet by mouth daily.     PARoxetine (PAXIL) 20 MG tablet TAKE 1 TABLET (20 MG TOTAL) BY MOUTH DAILY. FOLLOW UP DUE NEXT MONTH 30 tablet 0   tamoxifen (NOLVADEX) 20 MG tablet Take 1 tablet (20 mg total) by mouth daily. 90 tablet 3   vitamin B-12 (CYANOCOBALAMIN) 500 MCG tablet Take 500 mcg by mouth daily.     No current facility-administered medications on file prior to visit.    Review of Systems  Constitutional:  Negative for fever.  HENT:  Positive for congestion, sinus pressure and sore throat (irritation from cough). Negative for ear pain (clogged).   Eyes:  Negative for visual disturbance.  Respiratory:  Positive for cough (with current URI). Negative for shortness of breath and wheezing.   Cardiovascular:  Negative for chest pain, palpitations and leg swelling.  Gastrointestinal:  Negative for abdominal pain, blood in stool, constipation and diarrhea.       No gerd  Genitourinary:  Negative for dysuria.  Musculoskeletal:  Negative for arthralgias and back pain.  Skin:  Negative for rash.   Neurological:  Negative for light-headedness and headaches.  Psychiatric/Behavioral:  Negative for dysphoric mood. The patient is nervous/anxious (controlled).        Objective:   Vitals:   11/09/22 0956  BP: 128/88  Pulse: (!) 114  Temp: 98.1 F (36.7 C)  SpO2: 98%   Filed Weights   11/09/22 0956  Weight: 164 lb (74.4 kg)   Body mass index is 26.47 kg/m.  BP Readings from Last 3 Encounters:  11/09/22 128/88  08/17/22 137/67  02/08/22 124/79    Wt Readings from Last 3 Encounters:  11/09/22 164 lb (74.4 kg)  08/17/22 164 lb 6.4 oz (74.6 kg)  02/08/22 167 lb (75.8 kg)       Physical Exam Constitutional: She appears well-developed and well-nourished. No distress.  HENT:  Head: Normocephalic and atraumatic.  Right Ear: External ear normal. Normal ear canal and TM Left Ear: External ear normal.  Normal ear canal and TM Mouth/Throat: Oropharynx is clear and moist.  Eyes: Conjunctivae normal.  Neck: Neck supple. No tracheal deviation present. No thyromegaly present.  No carotid bruit  Cardiovascular: Normal rate, regular rhythm and normal heart sounds.   No murmur heard.  No edema. Pulmonary/Chest: Effort normal and breath sounds normal. No respiratory distress. She has no wheezes. She has no rales.  Breast: deferred   Abdominal: Soft. She exhibits no distension.  There is no tenderness.  Lymphadenopathy: She has no cervical adenopathy.  Skin: Skin is warm and dry. She is not diaphoretic.  Psychiatric: She has a normal mood and affect. Her behavior is normal.     Lab Results  Component Value Date   WBC 9.8 10/25/2021   HGB 12.1 10/25/2021   HCT 37.9 10/25/2021   PLT 399.0 10/25/2021   GLUCOSE 91 10/25/2021   CHOL 267 (H) 10/25/2021   TRIG 143.0 10/25/2021   HDL 93.10 10/25/2021   LDLDIRECT 129.0 04/17/2013   LDLCALC 146 (H) 10/25/2021   ALT 15 10/25/2021   AST 18 10/25/2021   NA 138 10/25/2021   K 4.9 10/25/2021   CL 101 10/25/2021   CREATININE 0.74  10/25/2021   BUN 10 10/25/2021   CO2 30 10/25/2021   TSH 1.98 10/20/2020   HGBA1C 5.4 10/25/2021         Assessment & Plan:   Physical exam: Screening blood work  ordered Exercise  daily  Weight  is good Substance abuse  none   Reviewed recommended immunizations.   Health Maintenance  Topic Date Due   COVID-19 Vaccine (9 - 2023-24 season) 11/25/2022 (Originally 01/13/2022)   Hepatitis C Screening  10[birth year]53 (Originally 12/28/1987)   INFLUENZA VACCINE  12/14/2022   PAP SMEAR-Modifier  03/12/2024   MAMMOGRAM  09/13/2024   DTaP/Tdap/Td (3 - Td or Tdap) 05/04/2029   Colonoscopy  02/11/2030   HIV Screening  Completed   HPV VACCINES  Aged Out   Zoster Vaccines- Shingrix  Discontinued          See Problem List for Assessment and Plan of chronic medical problems.

## 2022-11-09 ENCOUNTER — Ambulatory Visit (INDEPENDENT_AMBULATORY_CARE_PROVIDER_SITE_OTHER): Payer: BC Managed Care – PPO | Admitting: Internal Medicine

## 2022-11-09 VITALS — BP 128/88 | HR 114 | Temp 98.1°F | Ht 66.0 in | Wt 164.0 lb

## 2022-11-09 DIAGNOSIS — I10 Essential (primary) hypertension: Secondary | ICD-10-CM | POA: Diagnosis not present

## 2022-11-09 DIAGNOSIS — Z0001 Encounter for general adult medical examination with abnormal findings: Secondary | ICD-10-CM

## 2022-11-09 DIAGNOSIS — K219 Gastro-esophageal reflux disease without esophagitis: Secondary | ICD-10-CM

## 2022-11-09 DIAGNOSIS — F419 Anxiety disorder, unspecified: Secondary | ICD-10-CM | POA: Diagnosis not present

## 2022-11-09 DIAGNOSIS — R739 Hyperglycemia, unspecified: Secondary | ICD-10-CM | POA: Diagnosis not present

## 2022-11-09 DIAGNOSIS — Z0000 Encounter for general adult medical examination without abnormal findings: Secondary | ICD-10-CM

## 2022-11-09 DIAGNOSIS — R000 Tachycardia, unspecified: Secondary | ICD-10-CM | POA: Diagnosis not present

## 2022-11-09 LAB — COMPREHENSIVE METABOLIC PANEL
ALT: 14 U/L (ref 0–35)
AST: 17 U/L (ref 0–37)
Albumin: 4.1 g/dL (ref 3.5–5.2)
Alkaline Phosphatase: 35 U/L — ABNORMAL LOW (ref 39–117)
BUN: 11 mg/dL (ref 6–23)
CO2: 33 mEq/L — ABNORMAL HIGH (ref 19–32)
Calcium: 9.3 mg/dL (ref 8.4–10.5)
Chloride: 103 mEq/L (ref 96–112)
Creatinine, Ser: 0.72 mg/dL (ref 0.40–1.20)
GFR: 95.83 mL/min (ref >60.00)
Glucose, Bld: 91 mg/dL (ref 70–99)
Potassium: 4.4 mEq/L (ref 3.5–5.1)
Sodium: 141 mEq/L (ref 135–145)
Total Bilirubin: 0.5 mg/dL (ref 0.2–1.2)
Total Protein: 6.8 g/dL (ref 6.0–8.3)

## 2022-11-09 LAB — CBC WITH DIFFERENTIAL/PLATELET
Basophils Absolute: 0.1 10*3/uL (ref 0.0–0.1)
Basophils Relative: 0.6 % (ref 0.0–3.0)
Eosinophils Absolute: 0.3 10*3/uL (ref 0.0–0.7)
Eosinophils Relative: 2.6 % (ref 0.0–5.0)
HCT: 39.2 % (ref 36.0–46.0)
Hemoglobin: 12.7 g/dL (ref 12.0–15.0)
Lymphocytes Relative: 18.2 % (ref 12.0–46.0)
Lymphs Abs: 1.9 10*3/uL (ref 0.7–4.0)
MCHC: 32.5 g/dL (ref 30.0–36.0)
MCV: 86 fl (ref 78.0–100.0)
Monocytes Absolute: 0.9 10*3/uL (ref 0.1–1.0)
Monocytes Relative: 8.7 % (ref 3.0–12.0)
Neutro Abs: 7.4 10*3/uL (ref 1.4–7.7)
Neutrophils Relative %: 69.9 % (ref 43.0–77.0)
Platelets: 306 10*3/uL (ref 150.0–400.0)
RBC: 4.55 Mil/uL (ref 3.87–5.11)
RDW: 14.4 % (ref 11.5–15.5)
WBC: 10.6 10*3/uL — ABNORMAL HIGH (ref 4.0–10.5)

## 2022-11-09 LAB — LIPID PANEL
Cholesterol: 208 mg/dL — ABNORMAL HIGH (ref 0–200)
HDL: 58 mg/dL (ref >39.00)
NonHDL: 149.71
Total CHOL/HDL Ratio: 4
Triglycerides: 241 mg/dL — ABNORMAL HIGH (ref 0.0–149.0)
VLDL: 48.2 mg/dL — ABNORMAL HIGH (ref 0.0–40.0)

## 2022-11-09 LAB — TSH: TSH: 2.28 u[IU]/mL (ref 0.35–5.50)

## 2022-11-09 LAB — HEMOGLOBIN A1C: Hgb A1c MFr Bld: 5.2 % (ref 4.6–6.5)

## 2022-11-09 LAB — LDL CHOLESTEROL, DIRECT: Direct LDL: 115 mg/dL

## 2022-11-09 MED ORDER — HYDROCODONE BIT-HOMATROP MBR 5-1.5 MG/5ML PO SOLN: 5.0000 mL | Freq: Three times a day (TID) | ORAL | 0 refills | Status: DC | PRN

## 2022-11-09 NOTE — Assessment & Plan Note (Signed)
Chronic controlled Taking pepcid 20 mg daily 

## 2022-11-09 NOTE — Assessment & Plan Note (Addendum)
Chronic Blood pressure borderline high, but controlled CMP Continue lisinopril 5 mg daily

## 2022-11-09 NOTE — Assessment & Plan Note (Signed)
Tachycardic today-heart rate 114 Heart rate tends to be on the higher side May have an occasional palpitation, but nothing regular EKG today-normal sinus rhythm 89 bpm, normal EKG.  No change compared to last EKG from 08/2021 Will be getting TSH, CBC, CMP

## 2022-11-09 NOTE — Assessment & Plan Note (Addendum)
Chronic Controlled, Stable Check tsh Continue paroxetine 20 mg daily

## 2022-11-09 NOTE — Assessment & Plan Note (Signed)
Chronic Check a1c Low sugar/carbohydrate diet Regular exercise

## 2022-11-25 ENCOUNTER — Other Ambulatory Visit: Payer: Self-pay | Admitting: Internal Medicine

## 2022-11-28 ENCOUNTER — Other Ambulatory Visit: Payer: Self-pay | Admitting: Internal Medicine

## 2022-12-25 ENCOUNTER — Other Ambulatory Visit: Payer: Self-pay | Admitting: *Deleted

## 2022-12-25 MED ORDER — TAMOXIFEN CITRATE 20 MG PO TABS: 20.0000 mg | ORAL_TABLET | Freq: Every day | ORAL | 3 refills | Status: DC

## 2022-12-27 ENCOUNTER — Other Ambulatory Visit: Payer: Self-pay | Admitting: Internal Medicine

## 2023-02-16 ENCOUNTER — Inpatient Hospital Stay: Payer: BC Managed Care – PPO | Attending: Hematology and Oncology | Admitting: Hematology and Oncology

## 2023-02-16 VITALS — BP 126/73 | HR 118 | Temp 97.7°F | Resp 17 | Ht 66.0 in | Wt 162.9 lb

## 2023-02-16 DIAGNOSIS — D0501 Lobular carcinoma in situ of right breast: Secondary | ICD-10-CM | POA: Diagnosis not present

## 2023-02-16 DIAGNOSIS — Z9189 Other specified personal risk factors, not elsewhere classified: Secondary | ICD-10-CM

## 2023-02-16 DIAGNOSIS — Z79810 Long term (current) use of selective estrogen receptor modulators (SERMs): Secondary | ICD-10-CM | POA: Diagnosis not present

## 2023-02-16 DIAGNOSIS — Z888 Allergy status to other drugs, medicaments and biological substances status: Secondary | ICD-10-CM | POA: Diagnosis not present

## 2023-02-16 NOTE — Progress Notes (Signed)
[facility] CONSULT NOTE  Patient Care Team: Pincus Sanes, MD as PCP - General (Internal Medicine) Marcelle Overlie, MD (Obstetrics and Gynecology)  CHIEF COMPLAINTS/PURPOSE OF CONSULTATION:  LCIS  HISTORY OF PRESENTING ILLNESS:  Marie Adams 52 y.o. female is here because of recent diagnosis of right breast LCIS  I reviewed her records extensively and collaborated the history with the patient.  SUMMARY OF ONCOLOGIC HISTORY: Oncology History  Lobular carcinoma in situ (LCIS) of right breast s/p lumpectomy, seed implantation  04/22/2021 Mammogram   Screening mammogram showed calcifications in the right breast warranting further evaluation with magnified views.  In the left breast no findings suspicious for malignancy.  Diagnostic mammogram showed indeterminate 4 mm group of calcifications in the upper outer right breast.  MR bilateral breast showed 1.8 cm linear non-mass enhancement in the outer lower left breast.   06/23/2021 Initial Diagnosis   Lobular carcinoma in situ (LCIS) of right breast s/p lumpectomy, seed implantation   09/15/2021 Pathology Results   Pathology from left breast lumpectomy showed focal atypical lobular hyperplasia fibrocystic changes.  Right breast lumpectomy showed lobular carcinoma in situ, fibrocystic changes     The patient, with a history of breast cancer, is currently on tamoxifen treatment. She reports experiencing night sweats and irregular periods, which she attributes to perimenopause. She also mentions a previous heavy period. She has not experienced any other problems with the tamoxifen treatment.  The patient also reports a previous allergy to gadolinium used in MRI contrast, which resulted in a week-long episode of hives. She expresses interest in a CT breast scan as an alternative to MRI.  In addition, the patient mentions a previous prescription for hydrocodone, which she no longer takes. This was prescribed for a cough that  affected her entire family, but was not related to COVID-19.  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 She has two children, aged 43 and 58.  MEDICAL HISTORY:  Past Medical History:  Diagnosis Date   Anemia    ANXIETY    GERD (gastroesophageal reflux disease)    Heartburn    Hypertension     SURGICAL HISTORY: Past Surgical History:  Procedure Laterality Date   BREAST EXCISIONAL BIOPSY Left    BREAST LUMPECTOMY WITH RADIOACTIVE SEED LOCALIZATION Right 09/15/2021   Procedure: RIGHT BREAST LUMPECTOMY WITH RADIOACTIVE SEED LOCALIZATION;  Surgeon: Almond Lint, MD;  Location: [facility] OR;  Service: General;  Laterality: Right;   BREAST SURGERY  1990   Breast biopsy - benign   RADIOACTIVE SEED GUIDED EXCISIONAL BREAST BIOPSY Left 09/15/2021   Procedure: RADIOACTIVE SEED GUIDED EXCISIONAL LEFT BREAST BIOPSY;  Surgeon: Almond Lint, MD;  Location: [facility] OR;  Service: General;  Laterality: Left;   SIGMOIDOSCOPY  1990's    SOCIAL HISTORY: Social History   Socioeconomic History   Marital status: Married    Spouse name: Not on file   Number of children: Not on file   Years of education: Not on file   Highest education level: Not on file  Occupational History   Not on file  Tobacco Use   Smoking status: Never   Smokeless tobacco: Never  Vaping Use   Vaping status: Never Used  Substance and Sexual Activity   Alcohol use: Yes    Alcohol/week: 9.0 standard drinks of alcohol    Types: 9 Glasses of wine per week    Comment: 1 glass of wine nightly   Drug use: No  Sexual activity: Not on file  Other Topics Concern   Not on file  Social History Narrative   Lives with wife Teaching laboratory technician) 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      Exercise: treadmill   Social Determinants of Health   Financial Resource Strain: Not on file  Food Insecurity: Not on file  Transportation Needs: Not on file  Physical Activity: Not on file  Stress: Not on file  Social  Connections: Not on file  Intimate Partner Violence: Not on file    FAMILY HISTORY: Family History  Problem Relation Age of Onset   Hypertension Mother    Colon polyps Mother    Diabetes Father        borderline   Hyperlipidemia Father    Hypertension Father    Arthritis Maternal Grandmother    Transient ischemic attack Paternal Grandfather    Colon cancer Neg Hx    Esophageal cancer Neg Hx    Stomach cancer Neg Hx    Rectal cancer Neg Hx    Inflammatory bowel disease Neg Hx    Liver disease Neg Hx    Pancreatic cancer Neg Hx     ALLERGIES:  is allergic to promethazine hcl and gadolinium derivatives.  MEDICATIONS:  Current Outpatient Medications  Medication Sig Dispense Refill   Ca Phosphate-Cholecalciferol (CALCIUM/VITAMIN D3 GUMMIES PO) Take 2 each by mouth daily.     famotidine (PEPCID) 20 MG tablet Take 20 mg by mouth daily.     HYDROcodone bit-homatropine (HYCODAN) 5-1.5 MG/5ML syrup Take 5 mLs by mouth every 8 (eight) hours as needed for cough. 120 mL 0   Iron Carbonyl-Vitamin C-FOS (CHEWABLE IRON PO) Take 2 each by mouth daily.     lisinopril (ZESTRIL) 5 MG tablet TAKE 1 TABLET (5 MG TOTAL) BY MOUTH DAILY. 90 tablet 3   Multiple Vitamin (MULTIVITAMIN) tablet Take 1 tablet by mouth daily.     PARoxetine (PAXIL) 20 MG tablet TAKE 1 TABLET (20 MG TOTAL) BY MOUTH DAILY. FOLLOW UP DUE NEXT MONTH 90 tablet 1   tamoxifen (NOLVADEX) 20 MG tablet Take 1 tablet (20 mg total) by mouth daily. 90 tablet 3   vitamin B-12 (CYANOCOBALAMIN) 500 MCG tablet Take 500 mcg by mouth daily.     No current facility-administered medications for this visit.    REVIEW OF SYSTEMS:   Constitutional: Denies fevers, chills or abnormal night sweats Eyes: Denies blurriness of vision, double vision or watery eyes Ears, nose, mouth, throat, and face: Denies mucositis or sore throat Respiratory: Denies cough, dyspnea or wheezes Cardiovascular: Denies palpitation, chest discomfort or lower extremity  swelling Gastrointestinal:  Denies nausea, heartburn or change in bowel habits Skin: Denies abnormal skin rashes Lymphatics: Denies new lymphadenopathy or easy bruising Neurological:Denies numbness, tingling or new weaknesses Behavioral/Psych: Mood is stable, no new changes  Breast: Denies any palpable lumps or discharge All other systems were reviewed with the patient and are negative.  PHYSICAL EXAMINATION: ECOG PERFORMANCE STATUS: 0 - Asymptomatic  Vitals:   02/16/23 1253  BP: 126/73  Pulse: (!) 118  Resp: 17  Temp: 97.7 F (36.5 C)  SpO2: 100%    Filed Weights   02/16/23 1253  Weight: 162 lb 14.4 oz (73.9 kg)     GENERAL:alert, no distress and comfortable Breast: Bilateral breasts with post op changes. No palpable masses. No regional adenopathy. No lower extremity swelling   LABORATORY DATA:  I have reviewed the data as listed Lab Results  Component Value Date   WBC 10.6 (H) 11/09/2022  HGB 12.7 11/09/2022   HCT 39.2 11/09/2022   MCV 86.0 11/09/2022   PLT 306.0 11/09/2022   Lab Results  Component Value Date   NA 141 11/09/2022   K 4.4 11/09/2022   CL 103 11/09/2022   CO2 33 (H) 11/09/2022    RADIOGRAPHIC STUDIES: I have personally reviewed the radiological reports and agreed with the findings in the report.   ASSESSMENT AND PLAN:  Lobular carcinoma in situ (LCIS) of right breast s/p lumpectomy  This is a very pleasant 52 year old perimenopausal female patient with recently diagnosed with left breast focal atypical lobular hyperplasia and right breast LCIS referred to breast clinic for additional recommendations. She is now on adjuvant tamoxifen.   Breast Cancer Patient is tolerating Tamoxifen well with no significant side effects. She is experiencing perimenopausal symptoms. -Continue Tamoxifen as prescribed.  Allergy to Gadolinium Patient has a known allergy to gadolinium used in MRI contrast. -Order a CT of the breast as an alternative to MRI  for breast imaging.  General Health Maintenance -Plan to see the patient in six months for a follow-up visit. -Reviewed blood work done on June 27th, 2024 by Dr. Lawerance Bach. -No need for additional blood work at this time.  Total time spent: 30 minutes including history, physical exam, review of records, counseling and coordination of  All questions were answered. The patient knows to call the clinic with any problems, questions or concerns.    Rachel Moulds, MD 02/16/23

## 2023-02-28 ENCOUNTER — Other Ambulatory Visit: Payer: Self-pay

## 2023-02-28 ENCOUNTER — Encounter: Payer: Self-pay | Admitting: Hematology and Oncology

## 2023-02-28 DIAGNOSIS — Z9189 Other specified personal risk factors, not elsewhere classified: Secondary | ICD-10-CM

## 2023-02-28 DIAGNOSIS — D0501 Lobular carcinoma in situ of right breast: Secondary | ICD-10-CM

## 2023-03-05 ENCOUNTER — Other Ambulatory Visit: Payer: Self-pay | Admitting: Hematology and Oncology

## 2023-03-05 DIAGNOSIS — Z9189 Other specified personal risk factors, not elsewhere classified: Secondary | ICD-10-CM

## 2023-03-05 DIAGNOSIS — D0501 Lobular carcinoma in situ of right breast: Secondary | ICD-10-CM

## 2023-03-09 ENCOUNTER — Other Ambulatory Visit: Payer: Self-pay | Admitting: General Surgery

## 2023-03-09 DIAGNOSIS — D0501 Lobular carcinoma in situ of right breast: Secondary | ICD-10-CM

## 2023-03-09 DIAGNOSIS — N6091 Unspecified benign mammary dysplasia of right breast: Secondary | ICD-10-CM

## 2023-03-09 DIAGNOSIS — Z8041 Family history of malignant neoplasm of ovary: Secondary | ICD-10-CM

## 2023-05-01 ENCOUNTER — Inpatient Hospital Stay: Admission: RE | Admit: 2023-05-01 | Payer: BC Managed Care – PPO | Source: Ambulatory Visit

## 2023-05-28 ENCOUNTER — Telehealth: Payer: Self-pay

## 2023-05-28 MED ORDER — PREDNISONE 50 MG PO TABS: ORAL_TABLET | ORAL | 0 refills | Status: DC

## 2023-05-28 NOTE — Telephone Encounter (Signed)
 Phone call to patient to review instructions for 13 hr prep for MRI w/ contrast on 05/31/23 at 1510. Prescription called into [facility]. Pt aware and verbalized understanding of instructions. Prescription: Pt to take 50 mg of prednisone  on 05/31/23 at 2:10AM, 50 mg of prednisone  on 05/31/23 at 8:10AM, and 50 mg of prednisone  on 05/31/23 at 2:10PM. Pt is also to take 50 mg of benadryl on 05/31/23 at 2:10PM. Please call [phone] with any questions.

## 2023-05-31 ENCOUNTER — Ambulatory Visit
Admission: RE | Admit: 2023-05-31 | Discharge: 2023-05-31 | Disposition: A | Discharge status: Home / Self Care | Payer: BC Managed Care – PPO | Source: Ambulatory Visit | Attending: General Surgery | Admitting: General Surgery

## 2023-05-31 DIAGNOSIS — Z1239 Encounter for other screening for malignant neoplasm of breast: Secondary | ICD-10-CM | POA: Diagnosis not present

## 2023-05-31 DIAGNOSIS — N6091 Unspecified benign mammary dysplasia of right breast: Secondary | ICD-10-CM

## 2023-05-31 DIAGNOSIS — Z8041 Family history of malignant neoplasm of ovary: Secondary | ICD-10-CM

## 2023-05-31 DIAGNOSIS — D0501 Lobular carcinoma in situ of right breast: Secondary | ICD-10-CM

## 2023-05-31 MED ORDER — GADOPICLENOL 0.5 MMOL/ML IV SOLN
7.0000 mL | Freq: Once | INTRAVENOUS | Status: AC | PRN
  Administered 2023-05-31: 7 mL via INTRAVENOUS

## 2023-07-04 ENCOUNTER — Other Ambulatory Visit: Payer: Self-pay | Admitting: Internal Medicine

## 2023-07-04 MED ORDER — PAROXETINE HCL 20 MG PO TABS: 20.0000 mg | ORAL_TABLET | Freq: Every day | ORAL | 1 refills | Status: AC

## 2023-07-04 NOTE — Telephone Encounter (Signed)
 Copied from CRM [id number]. Topic: Clinical - Medication Refill >> Jul 04, 2023  9:27 AM Drema Balzarine wrote: Most Recent Primary Care Visit:  Provider: BURNS, Bobette Mo  Department: [facility]  Visit Type: PHYSICAL  Date: 11/09/2022  Medication: PARoxetine   Has the patient contacted their pharmacy? Yes (Agent: If no, request that the patient contact the pharmacy for the refill. If patient does not wish to contact the pharmacy document the reason why and proceed with request.) (Agent: If yes, when and what did the pharmacy advise?)  Is this the correct pharmacy for this prescription? Yes If no, delete pharmacy and type the correct one.  This is the patient's preferred pharmacy:   [id number] [place], [place] - [address] [place] [address] Phone: [phone] Fax: [phone]   Has the prescription been filled recently? Yes  Is the patient out of the medication? No, 3 left  Has the patient been seen for an appointment in the last year OR does the patient have an upcoming appointment? Yes  Can we respond through MyChart? No  Agent: Please be advised that Rx refills may take up to 3 business days. We ask that you follow-up with your pharmacy.

## 2023-08-23 ENCOUNTER — Inpatient Hospital Stay: Payer: BC Managed Care – PPO | Attending: Hematology and Oncology | Admitting: Hematology and Oncology

## 2023-08-23 VITALS — BP 122/67 | HR 92 | Temp 98.3°F | Resp 16 | Wt 165.2 lb

## 2023-08-23 DIAGNOSIS — D0501 Lobular carcinoma in situ of right breast: Secondary | ICD-10-CM | POA: Diagnosis not present

## 2023-08-23 DIAGNOSIS — Z9189 Other specified personal risk factors, not elsewhere classified: Secondary | ICD-10-CM

## 2023-08-23 DIAGNOSIS — N920 Excessive and frequent menstruation with regular cycle: Secondary | ICD-10-CM | POA: Diagnosis not present

## 2023-08-23 DIAGNOSIS — C50911 Malignant neoplasm of unspecified site of right female breast: Secondary | ICD-10-CM | POA: Diagnosis not present

## 2023-08-23 DIAGNOSIS — Z79810 Long term (current) use of selective estrogen receptor modulators (SERMs): Secondary | ICD-10-CM | POA: Insufficient documentation

## 2023-08-23 NOTE — Progress Notes (Signed)
 [facility] CONSULT NOTE  Patient Care Team: Pincus Sanes, MD as PCP - General (Internal Medicine) Marcelle Overlie, MD (Obstetrics and Gynecology)  CHIEF COMPLAINTS/PURPOSE OF CONSULTATION:  LCIS  HISTORY OF PRESENTING ILLNESS:  Marie Adams 53 y.o. female is here because of recent diagnosis of right breast LCIS  I reviewed her records extensively and collaborated the history with the patient.  SUMMARY OF ONCOLOGIC HISTORY: Oncology History  Lobular carcinoma in situ (LCIS) of right breast s/p lumpectomy, seed implantation  04/22/2021 Mammogram   Screening mammogram showed calcifications in the right breast warranting further evaluation with magnified views.  In the left breast no findings suspicious for malignancy.  Diagnostic mammogram showed indeterminate 4 mm group of calcifications in the upper outer right breast.  MR bilateral breast showed 1.8 cm linear non-mass enhancement in the outer lower left breast.   06/23/2021 Initial Diagnosis   Lobular carcinoma in situ (LCIS) of right breast s/p lumpectomy, seed implantation   09/15/2021 Pathology Results   Pathology from left breast lumpectomy showed focal atypical lobular hyperplasia fibrocystic changes.  Right breast lumpectomy showed lobular carcinoma in situ, fibrocystic changes   10/26/2021 -  Anti-estrogen oral therapy   Tamoxifen     Discussed the use of AI scribe software for clinical note transcription with the patient, who gave verbal consent to proceed.  History of Present Illness Marie Adams is a 53 year old female with breast cancer who presents for follow-up regarding her treatment and surveillance.  She has been on tamoxifen since June 2023, initially starting on a lower dose and currently taking 20 mg. She is expected to continue this medication for a total of five years. She underwent a breast MRI in January 2025, which was reported as normal, and she is scheduled for a mammogram in May  2025.  She experiences night sweats and irregular periods, which have persisted since her last visit. Her menstrual cycles are heavy but last only three days, occurring monthly without significant changes. No swelling in her legs, shortness of breath, or weight loss. Her weight remains stable at 160 pounds.  She has a known allergy to contrast, for which she takes prednisone and an over-the-counter allergy medication prior to imaging studies, effectively preventing reactions.  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   MEDICAL HISTORY:  Past Medical History:  Diagnosis Date   Anemia    ANXIETY    GERD (gastroesophageal reflux disease)    Heartburn    Hypertension     SURGICAL HISTORY: Past Surgical History:  Procedure Laterality Date   BREAST EXCISIONAL BIOPSY Left    BREAST LUMPECTOMY WITH RADIOACTIVE SEED LOCALIZATION Right 09/15/2021   Procedure: RIGHT BREAST LUMPECTOMY WITH RADIOACTIVE SEED LOCALIZATION;  Surgeon: Almond Lint, MD;  Location: [facility] OR;  Service: General;  Laterality: Right;   BREAST SURGERY  1990   Breast biopsy - benign   RADIOACTIVE SEED GUIDED EXCISIONAL BREAST BIOPSY Left 09/15/2021   Procedure: RADIOACTIVE SEED GUIDED EXCISIONAL LEFT BREAST BIOPSY;  Surgeon: Almond Lint, MD;  Location: [facility] OR;  Service: General;  Laterality: Left;   SIGMOIDOSCOPY  1990's    SOCIAL HISTORY: Social History   Socioeconomic History   Marital status: Married    Spouse name: Not on file   Number of children: Not on file   Years of education: Not on file   Highest  education level: Not on file  Occupational History   Not on file  Tobacco Use   Smoking status: Never   Smokeless tobacco: Never  Vaping Use   Vaping status: Never Used  Substance and Sexual Activity   Alcohol use: Yes    Alcohol/week: 9.0 standard drinks of alcohol     Types: 9 Glasses of wine per week    Comment: 1 glass of wine nightly   Drug use: No   Sexual activity: Not on file  Other Topics Concern   Not on file  Social History Narrative   Lives with wife Teaching laboratory technician) 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      Exercise: treadmill   Social Drivers of Corporate investment banker Strain: Not on file  Food Insecurity: Not on file  Transportation Needs: Not on file  Physical Activity: Not on file  Stress: Not on file  Social Connections: Not on file  Intimate Partner Violence: Not on file    FAMILY HISTORY: Family History  Problem Relation Age of Onset   Hypertension Mother    Colon polyps Mother    Diabetes Father        borderline   Hyperlipidemia Father    Hypertension Father    Arthritis Maternal Grandmother    Transient ischemic attack Paternal Grandfather    Colon cancer Neg Hx    Esophageal cancer Neg Hx    Stomach cancer Neg Hx    Rectal cancer Neg Hx    Inflammatory bowel disease Neg Hx    Liver disease Neg Hx    Pancreatic cancer Neg Hx     ALLERGIES:  is allergic to promethazine hcl and gadolinium derivatives.  MEDICATIONS:  Current Outpatient Medications  Medication Sig Dispense Refill   Ca Phosphate-Cholecalciferol (CALCIUM/VITAMIN D3 GUMMIES PO) Take 2 each by mouth daily.     famotidine (PEPCID) 20 MG tablet Take 20 mg by mouth daily.     HYDROcodone bit-homatropine (HYCODAN) 5-1.5 MG/5ML syrup Take 5 mLs by mouth every 8 (eight) hours as needed for cough. 120 mL 0   Iron Carbonyl-Vitamin C-FOS (CHEWABLE IRON PO) Take 2 each by mouth daily.     lisinopril (ZESTRIL) 5 MG tablet TAKE 1 TABLET (5 MG TOTAL) BY MOUTH DAILY. 90 tablet 3   Multiple Vitamin (MULTIVITAMIN) tablet Take 1 tablet by mouth daily.     PARoxetine (PAXIL) 20 MG tablet Take 1 tablet (20 mg total) by mouth daily. Follow up due next month 90 tablet 1   predniSONE (DELTASONE) 50 MG tablet Pt to take 50 mg of prednisone on 05/31/23 at 2:10AM, 50 mg of prednisone  on 05/31/23 at 8:10AM, and 50 mg of prednisone on 05/31/23 at 2:10PM. Pt is also to take 50 mg of benadryl on 05/31/23 at 2:10PM. Please call [phone] with any questions. 3 tablet 0   tamoxifen (NOLVADEX) 20 MG tablet Take 1 tablet (20 mg total) by mouth daily. 90 tablet 3   vitamin B-12 (CYANOCOBALAMIN) 500 MCG tablet Take 500 mcg by mouth daily.     No current facility-administered medications for this visit.    REVIEW OF SYSTEMS:   Constitutional: Denies fevers, chills or abnormal night sweats Eyes: Denies blurriness of vision, double vision or watery eyes Ears, nose, mouth, throat, and face: Denies mucositis or sore throat Respiratory: Denies cough, dyspnea or wheezes Cardiovascular: Denies palpitation, chest discomfort or lower extremity swelling Gastrointestinal:  Denies nausea, heartburn or change in bowel habits Skin: Denies abnormal skin rashes Lymphatics: Denies new  lymphadenopathy or easy bruising Neurological:Denies numbness, tingling or new weaknesses Behavioral/Psych: Mood is stable, no new changes  Breast: Denies any palpable lumps or discharge All other systems were reviewed with the patient and are negative.  PHYSICAL EXAMINATION: ECOG PERFORMANCE STATUS: 0 - Asymptomatic  Vitals:   08/23/23 1331  BP: 122/67  Pulse: 92  Resp: 16  Temp: 98.3 F (36.8 C)  SpO2: 99%    Filed Weights   08/23/23 1331  Weight: 165 lb 3.2 oz (74.9 kg)     GENERAL:alert, no distress and comfortable Breast: Bilateral breasts with post op changes. No palpable masses. No regional adenopathy. No lower extremity swelling   LABORATORY DATA:  I have reviewed the data as listed Lab Results  Component Value Date   WBC 10.6 (H) 11/09/2022   HGB 12.7 11/09/2022   HCT 39.2 11/09/2022   MCV 86.0 11/09/2022   PLT 306.0 11/09/2022   Lab Results  Component Value Date   NA 141 11/09/2022   K 4.4 11/09/2022   CL 103 11/09/2022   CO2 33 (H) 11/09/2022    RADIOGRAPHIC STUDIES: I  have personally reviewed the radiological reports and agreed with the findings in the report.   ASSESSMENT AND PLAN:  Lobular carcinoma in situ (LCIS) of right breast s/p lumpectomy  This is a very pleasant 53 year old perimenopausal female patient with recently diagnosed with left breast focal atypical lobular hyperplasia and right breast LCIS referred to breast clinic for additional recommendations. She is now on adjuvant tamoxifen.  Assessment & Plan Breast cancer On tamoxifen post-surgery, MRI normal, annual mammograms scheduled. Discussed MRI frequency and alternative imaging options. - Continue tamoxifen 20 mg daily for five years. - Order mammogram for Sep 17, 2023. - Consider biennial MRI starting January 2027 - Advise provider breast exam every six months, self-exams monthly.  Menstrual irregularities Reports heavy menstrual bleeding monthly, no significant cycle changes.  Contrast allergy Allergy to contrast managed with pre-medication. - Continue pre-medication with prednisone and OTC allergy medication for future contrast imaging.  Follow-up Follow-up in one year, transitioning to new provider given change in insurance. - Schedule follow-up in one year.   Total time spent: 30 minutes including history, physical exam, review of records, counseling and coordination of  All questions were answered. The patient knows to call the clinic with any problems, questions or concerns.    Rachel Moulds, MD 08/23/23

## 2023-08-24 ENCOUNTER — Telehealth: Payer: Self-pay | Admitting: Hematology and Oncology

## 2023-08-24 NOTE — Telephone Encounter (Signed)
 Confirmed with pt about scheduled app date and time

## 2023-09-17 ENCOUNTER — Other Ambulatory Visit: Payer: Self-pay | Admitting: *Deleted

## 2023-09-17 ENCOUNTER — Encounter: Payer: Self-pay | Admitting: Hematology and Oncology

## 2023-09-17 DIAGNOSIS — D0501 Lobular carcinoma in situ of right breast: Secondary | ICD-10-CM

## 2023-09-17 DIAGNOSIS — Z9189 Other specified personal risk factors, not elsewhere classified: Secondary | ICD-10-CM

## 2023-09-17 DIAGNOSIS — Z8041 Family history of malignant neoplasm of ovary: Secondary | ICD-10-CM

## 2023-09-25 ENCOUNTER — Encounter: Payer: Self-pay | Admitting: Internal Medicine

## 2023-09-28 ENCOUNTER — Other Ambulatory Visit: Payer: Self-pay | Admitting: Internal Medicine

## 2023-10-03 ENCOUNTER — Encounter

## 2023-11-13 ENCOUNTER — Encounter: Payer: BC Managed Care – PPO | Admitting: Internal Medicine

## 2023-11-25 ENCOUNTER — Emergency Department (HOSPITAL_BASED_OUTPATIENT_CLINIC_OR_DEPARTMENT_OTHER)
Admission: EM | Admit: 2023-11-25 | Discharge: 2023-11-26 | Disposition: A | Discharge status: Home / Self Care | Attending: Emergency Medicine | Admitting: Emergency Medicine

## 2023-11-25 ENCOUNTER — Other Ambulatory Visit: Payer: Self-pay

## 2023-11-25 ENCOUNTER — Encounter (HOSPITAL_BASED_OUTPATIENT_CLINIC_OR_DEPARTMENT_OTHER): Payer: Self-pay

## 2023-11-25 DIAGNOSIS — F419 Anxiety disorder, unspecified: Secondary | ICD-10-CM | POA: Insufficient documentation

## 2023-11-25 DIAGNOSIS — R072 Precordial pain: Secondary | ICD-10-CM | POA: Insufficient documentation

## 2023-11-25 DIAGNOSIS — D72829 Elevated white blood cell count, unspecified: Secondary | ICD-10-CM | POA: Insufficient documentation

## 2023-11-25 DIAGNOSIS — R12 Heartburn: Secondary | ICD-10-CM | POA: Diagnosis not present

## 2023-11-25 DIAGNOSIS — I10 Essential (primary) hypertension: Secondary | ICD-10-CM | POA: Diagnosis not present

## 2023-11-25 DIAGNOSIS — R112 Nausea with vomiting, unspecified: Secondary | ICD-10-CM | POA: Diagnosis not present

## 2023-11-25 DIAGNOSIS — Z79899 Other long term (current) drug therapy: Secondary | ICD-10-CM | POA: Diagnosis not present

## 2023-11-25 NOTE — ED Triage Notes (Signed)
 Pt c/o acid reflux, states that she woke up w more intense CP than I've ever had, states taht pain radiates to BUE, through to back. Advises that pain is worst in upper back, CP has now resided. Tums PTA

## 2023-11-26 ENCOUNTER — Emergency Department (HOSPITAL_BASED_OUTPATIENT_CLINIC_OR_DEPARTMENT_OTHER): Admitting: Radiology

## 2023-11-26 LAB — CBC
HCT: 40.4 % (ref 36.0–46.0)
Hemoglobin: 13.6 g/dL (ref 12.0–15.0)
MCH: 29 pg (ref 26.0–34.0)
MCHC: 33.7 g/dL (ref 30.0–36.0)
MCV: 86.1 fL (ref 80.0–100.0)
Platelets: 333 K/uL (ref 150–400)
RBC: 4.69 MIL/uL (ref 3.87–5.11)
RDW: 12.9 % (ref 11.5–15.5)
WBC: 11.5 K/uL — ABNORMAL HIGH (ref 4.0–10.5)
nRBC: 0 % (ref 0.0–0.2)

## 2023-11-26 LAB — BASIC METABOLIC PANEL WITH GFR
Anion gap: 12 (ref 5–15)
BUN: 11 mg/dL (ref 6–20)
CO2: 28 mmol/L (ref 22–32)
Calcium: 9.3 mg/dL (ref 8.9–10.3)
Chloride: 104 mmol/L (ref 98–111)
Creatinine, Ser: 0.7 mg/dL (ref 0.44–1.00)
GFR, Estimated: >60 mL/min (ref >60)
Glucose, Bld: 101 mg/dL — ABNORMAL HIGH (ref 70–99)
Potassium: 3.7 mmol/L (ref 3.5–5.1)
Sodium: 143 mmol/L (ref 135–145)

## 2023-11-26 LAB — TROPONIN T, HIGH SENSITIVITY
Troponin T High Sensitivity: <15 ng/L (ref <19)
Troponin T High Sensitivity: <15 ng/L (ref <19)

## 2023-11-26 MED ORDER — LIDOCAINE VISCOUS HCL 2 % MT SOLN
15.0000 mL | Freq: Once | OROMUCOSAL | Status: AC
  Administered 2023-11-26: 15 mL via ORAL
  Filled 2023-11-26: qty 15

## 2023-11-26 MED ORDER — PANTOPRAZOLE SODIUM 40 MG IV SOLR
40.0000 mg | Freq: Once | INTRAVENOUS | Status: AC
  Administered 2023-11-26: 40 mg via INTRAVENOUS
  Filled 2023-11-26: qty 10

## 2023-11-26 MED ORDER — ALUM & MAG HYDROXIDE-SIMETH 200-200-20 MG/5ML PO SUSP
30.0000 mL | Freq: Once | ORAL | Status: AC
  Administered 2023-11-26: 30 mL via ORAL
  Filled 2023-11-26: qty 30

## 2023-11-26 NOTE — ED Provider Notes (Signed)
 [place] EMERGENCY DEPARTMENT AT [facility] Provider Note  CSN: [id number] Arrival date & time: 11/25/23 2338  Chief Complaint(s) No chief complaint on file.  HPI Marie Adams is a 53 y.o. female    The history is provided by the patient.  Chest Pain Pain location:  L chest Pain quality: sharp and stabbing   Pain radiates to:  Upper back, L shoulder and R shoulder Pain severity:  Moderate Onset quality:  Gradual Duration: 1.5 hrs. Timing:  Constant Progression:  Improving Chronicity:  Recurrent (but worse than usual GERD pain) Relieved by: being upright and walking. Worsened by:  Nothing Associated symptoms: anxiety, heartburn, nausea and vomiting   Associated symptoms: no abdominal pain, no cough and no shortness of breath     Past Medical History Past Medical History:  Diagnosis Date   Anemia    ANXIETY    GERD (gastroesophageal reflux disease)    Heartburn    Hypertension    Patient Active Problem List   Diagnosis Date Noted   Tachycardia 11/09/2022   Dyskinesia of gallbladder 10/15/2021   Atypical chest pain 10/15/2021   Pyrosis 10/15/2021   Lobular carcinoma in situ (LCIS) of right breast s/p lumpectomy, seed implantation 06/23/2021   Abdominal pain 04/13/2021   Abdominal distension (gaseous) 04/13/2021   Iliac crest bone pain 10/20/2020   Hypertension 10/19/2020   Hyperglycemia 10/19/2020   Ganglion cyst of left foot 05/05/2019   Hyperlipidemia 01/22/2019   Liver cyst 07/12/2017   Abnormal ultrasound of kidney 07/12/2017   Anemia 06/28/2017   GERD (gastroesophageal reflux disease) 06/28/2017   Anxiety 03/02/2009   Home Medication(s) Prior to Admission medications   Medication Sig Start Date End Date Taking? Authorizing Provider  Ca Phosphate-Cholecalciferol (CALCIUM/VITAMIN D3 GUMMIES PO) Take 2 each by mouth daily.    [provider]  famotidine (PEPCID) 20 MG tablet Take 20 mg by mouth daily.    [provider]   Iron Carbonyl-Vitamin C-FOS (CHEWABLE IRON PO) Take 2 each by mouth daily.    [provider]  lisinopril  (ZESTRIL ) 5 MG tablet TAKE 1 TABLET (5 MG TOTAL) BY MOUTH DAILY. 09/28/23   Geofm Glade PARAS, MD  Multiple Vitamin (MULTIVITAMIN) tablet Take 1 tablet by mouth daily.    [provider]  PARoxetine  (PAXIL ) 20 MG tablet Take 1 tablet (20 mg total) by mouth daily. Follow up due next month 07/04/23   Geofm Glade PARAS, MD  tamoxifen  (NOLVADEX ) 20 MG tablet Take 1 tablet (20 mg total) by mouth daily. 12/25/22   Iruku, Praveena, MD  vitamin B-12 (CYANOCOBALAMIN) 500 MCG tablet Take 500 mcg by mouth daily.    [provider]                                                                                                                                    Allergies Promethazine hcl and Gadolinium derivatives  Review of Systems Review  of Systems  Respiratory:  Negative for cough and shortness of breath.   Cardiovascular:  Positive for chest pain.  Gastrointestinal:  Positive for heartburn, nausea and vomiting. Negative for abdominal pain.   As noted in HPI  Physical Exam Vital Signs  I have reviewed the triage vital signs BP (!) 120/52   Pulse 85   Temp 98.1 F (36.7 C)   Resp 19   SpO2 100%   Physical Exam Vitals reviewed.  Constitutional:      General: She is not in acute distress.    Appearance: She is well-developed. She is not diaphoretic.  HENT:     Head: Normocephalic and atraumatic.     Nose: Nose normal.  Eyes:     General: No scleral icterus.       Right eye: No discharge.        Left eye: No discharge.     Conjunctiva/sclera: Conjunctivae normal.     Pupils: Pupils are equal, round, and reactive to light.  Cardiovascular:     Rate and Rhythm: Normal rate and regular rhythm.     Heart sounds: No murmur heard.    No friction rub. No gallop.  Pulmonary:     Effort: Pulmonary effort is normal. No respiratory distress.     Breath sounds: Normal  breath sounds. No stridor. No rales.  Abdominal:     General: There is no distension.     Palpations: Abdomen is soft.     Tenderness: There is no abdominal tenderness. There is no guarding or rebound.  Musculoskeletal:        General: No tenderness.     Cervical back: Normal range of motion and neck supple.  Skin:    General: Skin is warm and dry.     Findings: No erythema or rash.  Neurological:     Mental Status: She is alert and oriented to person, place, and time.     ED Results and Treatments Labs (all labs ordered are listed, but only abnormal results are displayed) Labs Reviewed  BASIC METABOLIC PANEL WITH GFR - Abnormal; Notable for the following components:      Result Value   Glucose, Bld 101 (*)    All other components within normal limits  CBC - Abnormal; Notable for the following components:   WBC 11.5 (*)    All other components within normal limits  TROPONIN T, HIGH SENSITIVITY  TROPONIN T, HIGH SENSITIVITY                                                                                                                         EKG  EKG Interpretation Date/Time:  Sunday November 25 2023 23:46:15 EDT Ventricular Rate:  89 PR Interval:  126 QRS Duration:  80 QT Interval:  399 QTC Calculation: 486 R Axis:   59  Text Interpretation: Sinus rhythm Low voltage, precordial leads Borderline prolonged QT interval No significant change was found Confirmed by Arvell Pulsifer [phone]) on  11/26/2023 12:24:29 AM       Radiology DG Chest 2 View Result Date: 11/26/2023 CLINICAL DATA:  Chest pain EXAM: CHEST - 2 VIEW COMPARISON:  None Available. FINDINGS: The heart size and mediastinal contours are within normal limits. Both lungs are clear. The visualized skeletal structures are unremarkable. IMPRESSION: No active cardiopulmonary disease. Electronically Signed   By: Oneil Devonshire M.D.   On: 11/26/2023 00:39    Medications Ordered in ED Medications  alum & mag hydroxide-simeth  (MAALOX/MYLANTA) 200-200-20 MG/5ML suspension 30 mL (30 mLs Oral Given 11/26/23 0042)    And  lidocaine  (XYLOCAINE ) 2 % viscous mouth solution 15 mL (15 mLs Oral Given 11/26/23 0042)  pantoprazole  (PROTONIX ) injection 40 mg (40 mg Intravenous Given 11/26/23 0222)   Procedures Procedures  (including critical care time) Medical Decision Making / ED Course   Medical Decision Making Amount and/or Complexity of Data Reviewed Labs: ordered. Decision-making details documented in ED Course. Radiology: ordered and independent interpretation performed. Decision-making details documented in ED Course. ECG/medicine tests: ordered and independent interpretation performed. Decision-making details documented in ED Course.  Risk OTC drugs. Prescription drug management.    Chest pain differential diagnosis considered.  EKG without acute ischemic changes or evidence of pericarditis.  Heart score less than 3.  Serial troponins negative x 2.  Unlikely ACS.  CBC with mild leukocytosis.  No anemia.  No significant electrolyte derangements or renal sufficiency.  Chest x-ray without evidence of pneumonia, pneumothorax, pulmonary edema pleural effusions.  Presentation not classic for aortic dissection or esophageal perforation.  Low suspicion for pulmonary embolism.   Pain completely resolved with GI cocktail.  Did come back slightly but to her normal GERD intensity.  Given Protonix .  Tolerating p.o.     Final Clinical Impression(s) / ED Diagnoses Final diagnoses:  Precordial pain   The patient appears reasonably screened and/or stabilized for discharge and I doubt any other medical condition or other [facility] requiring further screening, evaluation, or treatment in the ED at this time. I have discussed the findings, Dx and Tx plan with the patient/family who expressed understanding and agree(s) with the plan. Discharge instructions discussed at length. The patient/family was given strict return precautions  who verbalized understanding of the instructions. No further questions at time of discharge.  Disposition: Discharge  Condition: Good  ED Discharge Orders     None        Follow Up: Emilio Joesph DEL, PA-C [address] [place] [address] [phone]  Call  to schedule an appointment for close follow up    This chart was dictated using voice recognition software.  Despite best efforts to proofread,  errors can occur which can change the documentation meaning.    Trine Raynell Moder, MD 11/26/23 [id number]

## 2024-01-26 ENCOUNTER — Encounter: Payer: Self-pay | Admitting: Hematology and Oncology

## 2024-01-28 ENCOUNTER — Other Ambulatory Visit: Payer: Self-pay | Admitting: *Deleted

## 2024-01-28 MED ORDER — TAMOXIFEN CITRATE 20 MG PO TABS: 20.0000 mg | ORAL_TABLET | Freq: Every day | ORAL | 3 refills | Status: AC

## 2024-08-26 ENCOUNTER — Ambulatory Visit: Admitting: Hematology and Oncology
